# Patient Record
Sex: Female | Born: 1935
Health system: Southern US, Community
[De-identification: ages and names within clinical notes are randomized; demographics above are authoritative.]

## PROBLEM LIST (undated history)

## (undated) DIAGNOSIS — F1190 Opioid use, unspecified, uncomplicated: Secondary | ICD-10-CM

## (undated) DIAGNOSIS — Z8742 Personal history of other diseases of the female genital tract: Secondary | ICD-10-CM

## (undated) DIAGNOSIS — R011 Cardiac murmur, unspecified: Secondary | ICD-10-CM

## (undated) DIAGNOSIS — M545 Low back pain, unspecified: Secondary | ICD-10-CM

## (undated) DIAGNOSIS — F32A Depression, unspecified: Secondary | ICD-10-CM

## (undated) DIAGNOSIS — M419 Scoliosis, unspecified: Secondary | ICD-10-CM

## (undated) DIAGNOSIS — F329 Major depressive disorder, single episode, unspecified: Secondary | ICD-10-CM

## (undated) DIAGNOSIS — G8929 Other chronic pain: Secondary | ICD-10-CM

## (undated) DIAGNOSIS — N200 Calculus of kidney: Secondary | ICD-10-CM

## (undated) DIAGNOSIS — E039 Hypothyroidism, unspecified: Secondary | ICD-10-CM

## (undated) DIAGNOSIS — K219 Gastro-esophageal reflux disease without esophagitis: Secondary | ICD-10-CM

## (undated) DIAGNOSIS — M8580 Other specified disorders of bone density and structure, unspecified site: Secondary | ICD-10-CM

## (undated) DIAGNOSIS — Z87442 Personal history of urinary calculi: Secondary | ICD-10-CM

## (undated) DIAGNOSIS — L409 Psoriasis, unspecified: Secondary | ICD-10-CM

## (undated) DIAGNOSIS — R351 Nocturia: Secondary | ICD-10-CM

## (undated) DIAGNOSIS — K449 Diaphragmatic hernia without obstruction or gangrene: Secondary | ICD-10-CM

## (undated) DIAGNOSIS — N182 Chronic kidney disease, stage 2 (mild): Secondary | ICD-10-CM

## (undated) DIAGNOSIS — I10 Essential (primary) hypertension: Secondary | ICD-10-CM

## (undated) DIAGNOSIS — E785 Hyperlipidemia, unspecified: Secondary | ICD-10-CM

## (undated) DIAGNOSIS — M1990 Unspecified osteoarthritis, unspecified site: Secondary | ICD-10-CM

## (undated) DIAGNOSIS — Z973 Presence of spectacles and contact lenses: Secondary | ICD-10-CM

## (undated) HISTORY — DX: Other specified disorders of bone density and structure, unspecified site: M85.80

## (undated) HISTORY — PX: CARDIOVASCULAR STRESS TEST: SHX262

## (undated) HISTORY — PX: ABDOMINAL HYSTERECTOMY: SHX81

## (undated) HISTORY — PX: CARDIAC CATHETERIZATION: SHX172

## (undated) HISTORY — DX: Unspecified osteoarthritis, unspecified site: M19.90

## (undated) HISTORY — DX: Gastro-esophageal reflux disease without esophagitis: K21.9

## (undated) HISTORY — DX: Scoliosis, unspecified: M41.9

## (undated) HISTORY — PX: OTHER SURGICAL HISTORY: SHX169

## (undated) HISTORY — DX: Major depressive disorder, single episode, unspecified: F32.9

## (undated) HISTORY — DX: Depression, unspecified: F32.A

## (undated) HISTORY — DX: Hyperlipidemia, unspecified: E78.5

---

## 1989-01-03 HISTORY — PX: BREAST LUMPECTOMY: SHX2

## 1999-05-06 HISTORY — PX: CATARACT EXTRACTION W/ INTRAOCULAR LENS  IMPLANT, BILATERAL: SHX1307

## 1999-08-01 ENCOUNTER — Other Ambulatory Visit: Admission: RE | Admit: 1999-08-01 | Discharge: 1999-08-01 | Payer: Self-pay | Admitting: *Deleted

## 2000-04-18 ENCOUNTER — Ambulatory Visit (HOSPITAL_COMMUNITY): Admission: RE | Admit: 2000-04-18 | Discharge: 2000-04-18 | Payer: Self-pay | Admitting: *Deleted

## 2000-04-18 ENCOUNTER — Encounter: Payer: Self-pay | Admitting: *Deleted

## 2001-03-02 ENCOUNTER — Other Ambulatory Visit: Admission: RE | Admit: 2001-03-02 | Discharge: 2001-03-02 | Payer: Self-pay | Admitting: *Deleted

## 2002-01-26 ENCOUNTER — Ambulatory Visit: Admission: RE | Admit: 2002-01-26 | Discharge: 2002-01-26 | Payer: Self-pay | Admitting: Family Medicine

## 2002-01-26 ENCOUNTER — Encounter: Payer: Self-pay | Admitting: Family Medicine

## 2004-03-13 ENCOUNTER — Encounter: Admission: RE | Admit: 2004-03-13 | Discharge: 2004-04-23 | Payer: Self-pay | Admitting: Family Medicine

## 2004-06-26 ENCOUNTER — Encounter: Admission: RE | Admit: 2004-06-26 | Discharge: 2004-08-08 | Payer: Self-pay | Admitting: Family Medicine

## 2004-09-24 ENCOUNTER — Encounter: Admission: RE | Admit: 2004-09-24 | Discharge: 2004-10-21 | Payer: Self-pay | Admitting: Family Medicine

## 2004-11-15 ENCOUNTER — Other Ambulatory Visit: Admission: RE | Admit: 2004-11-15 | Discharge: 2004-11-15 | Payer: Self-pay | Admitting: Family Medicine

## 2006-08-18 ENCOUNTER — Encounter
Admission: RE | Admit: 2006-08-18 | Discharge: 2006-08-18 | Payer: Self-pay | Admitting: Physical Medicine and Rehabilitation

## 2006-08-18 ENCOUNTER — Encounter: Admission: RE | Admit: 2006-08-18 | Discharge: 2006-08-18 | Payer: Self-pay | Admitting: Family Medicine

## 2006-12-29 ENCOUNTER — Ambulatory Visit (HOSPITAL_COMMUNITY): Admission: RE | Admit: 2006-12-29 | Discharge: 2006-12-29 | Payer: Self-pay | Admitting: Family Medicine

## 2006-12-29 ENCOUNTER — Ambulatory Visit: Payer: Self-pay | Admitting: Surgery

## 2006-12-29 ENCOUNTER — Encounter (INDEPENDENT_AMBULATORY_CARE_PROVIDER_SITE_OTHER): Payer: Self-pay | Admitting: Family Medicine

## 2007-01-06 ENCOUNTER — Ambulatory Visit: Payer: Self-pay | Admitting: Vascular Surgery

## 2007-09-23 ENCOUNTER — Encounter: Admission: RE | Admit: 2007-09-23 | Discharge: 2007-09-23 | Payer: Self-pay | Admitting: Specialist

## 2009-06-17 ENCOUNTER — Emergency Department (HOSPITAL_COMMUNITY): Admission: EM | Admit: 2009-06-17 | Discharge: 2009-06-17 | Payer: Self-pay | Admitting: Emergency Medicine

## 2009-08-28 ENCOUNTER — Emergency Department (HOSPITAL_COMMUNITY): Admission: EM | Admit: 2009-08-28 | Discharge: 2009-08-28 | Payer: Self-pay | Admitting: Emergency Medicine

## 2009-09-19 ENCOUNTER — Inpatient Hospital Stay (HOSPITAL_COMMUNITY): Admission: RE | Admit: 2009-09-19 | Discharge: 2009-09-21 | Payer: Self-pay | Admitting: Urology

## 2009-09-19 HISTORY — PX: PERCUTANEOUS NEPHROSTOLITHOTOMY: SHX2207

## 2009-12-25 ENCOUNTER — Inpatient Hospital Stay (HOSPITAL_COMMUNITY): Admission: EM | Admit: 2009-12-25 | Discharge: 2009-12-26 | Payer: Self-pay | Admitting: Emergency Medicine

## 2009-12-28 HISTORY — PX: TRANSTHORACIC ECHOCARDIOGRAM: SHX275

## 2010-01-04 ENCOUNTER — Inpatient Hospital Stay (HOSPITAL_COMMUNITY): Admission: EM | Admit: 2010-01-04 | Discharge: 2010-01-04 | Payer: Self-pay | Admitting: Emergency Medicine

## 2010-01-23 ENCOUNTER — Inpatient Hospital Stay (HOSPITAL_BASED_OUTPATIENT_CLINIC_OR_DEPARTMENT_OTHER): Admission: RE | Admit: 2010-01-23 | Discharge: 2010-01-23 | Payer: Self-pay | Admitting: Interventional Cardiology

## 2010-07-18 LAB — VITAMIN B12: Vitamin B-12: 803 pg/mL (ref 211–911)

## 2010-07-18 LAB — CBC
Hemoglobin: 12.9 g/dL (ref 12.0–15.0)
MCH: 32.1 pg (ref 26.0–34.0)
MCHC: 33.3 g/dL (ref 30.0–36.0)
RBC: 4.01 MIL/uL (ref 3.87–5.11)
RDW: 13.6 % (ref 11.5–15.5)

## 2010-07-18 LAB — CK TOTAL AND CKMB (NOT AT ARMC): Relative Index: INVALID (ref 0.0–2.5)

## 2010-07-18 LAB — C-REACTIVE PROTEIN: CRP: 0 mg/dL — ABNORMAL LOW (ref <0.6)

## 2010-07-18 LAB — COMPREHENSIVE METABOLIC PANEL
AST: 27 U/L (ref 0–37)
Albumin: 4.1 g/dL (ref 3.5–5.2)
Alkaline Phosphatase: 58 U/L (ref 39–117)
CO2: 22 mEq/L (ref 19–32)
Calcium: 9.4 mg/dL (ref 8.4–10.5)
Creatinine, Ser: 1.1 mg/dL (ref 0.4–1.2)
GFR calc Af Amer: 59 mL/min — ABNORMAL LOW (ref >60)
GFR calc non Af Amer: 48 mL/min — ABNORMAL LOW (ref >60)

## 2010-07-18 LAB — DIFFERENTIAL
Lymphs Abs: 3.3 10*3/uL (ref 0.7–4.0)
Neutro Abs: 3.1 10*3/uL (ref 1.7–7.7)

## 2010-07-18 LAB — PROTIME-INR: INR: 1.03 (ref 0.00–1.49)

## 2010-07-18 LAB — TROPONIN I: Troponin I: 0.02 ng/mL (ref 0.00–0.06)

## 2010-07-18 LAB — SEDIMENTATION RATE: Sed Rate: 13 mm/hr (ref 0–22)

## 2010-07-18 LAB — APTT: aPTT: 28 seconds (ref 24–37)

## 2010-07-19 LAB — BASIC METABOLIC PANEL
Chloride: 106 mEq/L (ref 96–112)
GFR calc Af Amer: >60 mL/min (ref >60)
Sodium: 140 mEq/L (ref 135–145)

## 2010-07-19 LAB — DIFFERENTIAL
Basophils Absolute: 0 10*3/uL (ref 0.0–0.1)
Basophils Relative: 1 % (ref 0–1)
Eosinophils Absolute: 0.1 10*3/uL (ref 0.0–0.7)
Monocytes Relative: 9 % (ref 3–12)
Neutro Abs: 2.4 10*3/uL (ref 1.7–7.7)
Neutrophils Relative %: 47 % (ref 43–77)

## 2010-07-19 LAB — COMPREHENSIVE METABOLIC PANEL
AST: 18 U/L (ref 0–37)
Albumin: 3.2 g/dL — ABNORMAL LOW (ref 3.5–5.2)
Alkaline Phosphatase: 44 U/L (ref 39–117)
BUN: 13 mg/dL (ref 6–23)
Chloride: 111 mEq/L (ref 96–112)
GFR calc Af Amer: >60 mL/min (ref >60)
Potassium: 4.4 mEq/L (ref 3.5–5.1)
Sodium: 142 mEq/L (ref 135–145)
Total Protein: 5.6 g/dL — ABNORMAL LOW (ref 6.0–8.3)

## 2010-07-19 LAB — POCT CARDIAC MARKERS
Myoglobin, poc: 53 ng/mL (ref 12–200)
Troponin i, poc: <0.05 ng/mL (ref 0.00–0.09)

## 2010-07-19 LAB — CBC
Hemoglobin: 12.8 g/dL (ref 12.0–15.0)
MCH: 32.8 pg (ref 26.0–34.0)
MCHC: 34 g/dL (ref 30.0–36.0)
Platelets: 273 10*3/uL (ref 150–400)

## 2010-07-19 LAB — PROTIME-INR: Prothrombin Time: 14 seconds (ref 11.6–15.2)

## 2010-07-19 LAB — CARDIAC PANEL(CRET KIN+CKTOT+MB+TROPI)
CK, MB: 1.5 ng/mL (ref 0.3–4.0)
CK, MB: 1.6 ng/mL (ref 0.3–4.0)
Relative Index: 1.4 (ref 0.0–2.5)
Relative Index: 1.5 (ref 0.0–2.5)
Total CK: 115 U/L (ref 7–177)
Total CK: 95 U/L (ref 7–177)
Troponin I: 0.01 ng/mL (ref 0.00–0.06)
Troponin I: 0.01 ng/mL (ref 0.00–0.06)

## 2010-07-19 LAB — LIPID PANEL
LDL Cholesterol: 97 mg/dL (ref 0–99)
Total CHOL/HDL Ratio: 3 RATIO
VLDL: 14 mg/dL (ref 0–40)

## 2010-07-19 LAB — D-DIMER, QUANTITATIVE: D-Dimer, Quant: 0.32 ug/mL-FEU (ref 0.00–0.48)

## 2010-07-19 LAB — TSH: TSH: 1.065 u[IU]/mL (ref 0.350–4.500)

## 2010-07-22 LAB — CBC
HCT: 39.4 % (ref 36.0–46.0)
Hemoglobin: 13.2 g/dL (ref 12.0–15.0)
MCHC: 33.1 g/dL (ref 30.0–36.0)
MCHC: 33.5 g/dL (ref 30.0–36.0)
Platelets: 230 10*3/uL (ref 150–400)
Platelets: 277 10*3/uL (ref 150–400)
RDW: 13.7 % (ref 11.5–15.5)
RDW: 14 % (ref 11.5–15.5)

## 2010-07-22 LAB — COMPREHENSIVE METABOLIC PANEL
Alkaline Phosphatase: 63 U/L (ref 39–117)
BUN: 14 mg/dL (ref 6–23)
Chloride: 106 mEq/L (ref 96–112)
Creatinine, Ser: 0.88 mg/dL (ref 0.4–1.2)
GFR calc non Af Amer: >60 mL/min (ref >60)
Glucose, Bld: 91 mg/dL (ref 70–99)
Potassium: 4.4 mEq/L (ref 3.5–5.1)
Total Bilirubin: 1 mg/dL (ref 0.3–1.2)

## 2010-07-22 LAB — PROTIME-INR: Prothrombin Time: 12.7 seconds (ref 11.6–15.2)

## 2010-07-22 LAB — ABO/RH: ABO/RH(D): O POS

## 2010-07-23 LAB — LIPASE, BLOOD: Lipase: 41 U/L (ref 11–59)

## 2010-07-23 LAB — DIFFERENTIAL
Eosinophils Absolute: 0.1 10*3/uL (ref 0.0–0.7)
Eosinophils Relative: 2 % (ref 0–5)
Lymphs Abs: 2.5 10*3/uL (ref 0.7–4.0)

## 2010-07-23 LAB — URINE CULTURE: Colony Count: 70000

## 2010-07-23 LAB — URINALYSIS, ROUTINE W REFLEX MICROSCOPIC
Bilirubin Urine: NEGATIVE
Glucose, UA: NEGATIVE mg/dL
Ketones, ur: NEGATIVE mg/dL
Nitrite: NEGATIVE
pH: 8 (ref 5.0–8.0)

## 2010-07-23 LAB — COMPREHENSIVE METABOLIC PANEL
ALT: 22 U/L (ref 0–35)
AST: 25 U/L (ref 0–37)
CO2: 28 mEq/L (ref 19–32)
Calcium: 9.2 mg/dL (ref 8.4–10.5)
Chloride: 103 mEq/L (ref 96–112)
GFR calc Af Amer: >60 mL/min (ref >60)
GFR calc non Af Amer: 56 mL/min — ABNORMAL LOW (ref >60)
Potassium: 4 mEq/L (ref 3.5–5.1)
Sodium: 138 mEq/L (ref 135–145)

## 2010-07-23 LAB — CBC
MCHC: 33.1 g/dL (ref 30.0–36.0)
RBC: 4.1 MIL/uL (ref 3.87–5.11)
WBC: 7.5 10*3/uL (ref 4.0–10.5)

## 2010-07-23 LAB — URINE MICROSCOPIC-ADD ON

## 2010-07-25 LAB — CBC
HCT: 42.5 % (ref 36.0–46.0)
Hemoglobin: 14.4 g/dL (ref 12.0–15.0)
MCHC: 33.8 g/dL (ref 30.0–36.0)
MCV: 97.3 fL (ref 78.0–100.0)
Platelets: 246 10*3/uL (ref 150–400)
RBC: 4.36 MIL/uL (ref 3.87–5.11)
RDW: 12.9 % (ref 11.5–15.5)
WBC: 4.7 10*3/uL (ref 4.0–10.5)

## 2010-07-25 LAB — DIFFERENTIAL
Basophils Absolute: 0 10*3/uL (ref 0.0–0.1)
Basophils Relative: 0 % (ref 0–1)
Eosinophils Absolute: 0.2 K/uL (ref 0.0–0.7)
Eosinophils Relative: 3 % (ref 0–5)
Lymphocytes Relative: 29 % (ref 12–46)
Lymphs Abs: 1.4 10*3/uL (ref 0.7–4.0)
Monocytes Absolute: 0.5 10*3/uL (ref 0.1–1.0)
Monocytes Relative: 10 % (ref 3–12)
Neutro Abs: 2.7 10*3/uL (ref 1.7–7.7)
Neutrophils Relative %: 57 % (ref 43–77)

## 2010-07-25 LAB — COMPREHENSIVE METABOLIC PANEL
Albumin: 3.7 g/dL (ref 3.5–5.2)
BUN: 21 mg/dL (ref 6–23)
Calcium: 9 mg/dL (ref 8.4–10.5)
Chloride: 108 mEq/L (ref 96–112)
Creatinine, Ser: 0.85 mg/dL (ref 0.4–1.2)
GFR calc Af Amer: >60 mL/min (ref >60)
GFR calc non Af Amer: >60 mL/min (ref >60)
Total Bilirubin: 0.5 mg/dL (ref 0.3–1.2)

## 2010-07-25 LAB — COMPREHENSIVE METABOLIC PANEL WITH GFR
ALT: 95 U/L — ABNORMAL HIGH (ref 0–35)
AST: 70 U/L — ABNORMAL HIGH (ref 0–37)
Alkaline Phosphatase: 53 U/L (ref 39–117)
CO2: 20 meq/L (ref 19–32)
Glucose, Bld: 88 mg/dL (ref 70–99)
Potassium: 3.6 meq/L (ref 3.5–5.1)
Sodium: 138 meq/L (ref 135–145)
Total Protein: 7.2 g/dL (ref 6.0–8.3)

## 2010-07-25 LAB — LIPASE, BLOOD: Lipase: 24 U/L (ref 11–59)

## 2010-09-17 NOTE — Consult Note (Signed)
NEW PATIENT CONSULTATION   Becky Duran, Becky Duran  DOB:  03-20-1933                                       01/06/2007  EAVWU#:[id number]   This patient presents today for evaluation of lower-extremity venous  pathology.  She is a pleasant 77 year old white female with a long  history of left leg venous varicosities and right leg spider vein  telangiectasia causing her discomfort.  She reports over the past  several months this has become much more severe with discomfort in both  medial calves.  She reports that she has aching and burning in both  lower extremities constantly, but most particularly over the  varicosities.  She does not have any history of deep venous thrombosis  or hemorrhage.  She reports this is more severe with prolonged sitting,  standing, going up and down stairs and also house work that requires her  to squat to do her work.   PAST MEDICAL HISTORY:  Significant for hypothyroidism, elevated  cholesterol.  She is not hypertensive or diabetic.  She does have a  history of heart murmur.   PAST SURGICAL HISTORY:  Significant for hysterectomy and cyst removal  for benign breast disease.  She does elevate her legs when possible and  does use Tylenol for discomfort.   She is married.  She has no children.  She is retired.  She does not  smoke or drink alcohol on a regular basis.   REVIEW OF SYSTEMS:  Positive only for arthritic joint pain.   SHE IS ALLERGIC TO PENICILLIN.   PHYSICAL EXAMINATION:  A well-developed, well-nourished white female  appearing stated age of 77.  Her blood pressure 132/80, pulse 76,  respirations 18.  Her dorsalis pedis pulses are 2+ bilaterally.  She  does have spider vein telangiectasia diffusely over both legs.  She does  have a patch of telangiectasia over the right medial calf that is very  pronounced, and she reports does cause her pain with prolonged standing.  She does have tributary varicosities over the left medial  thigh and  medial calf.  She underwent hand-held duplex by me, and this did reveal  reflux in her saphenous vein on the left, with branch from her saphenous  vein leading to the superficial varicosities on her thigh and calf.  Her  right vein appears to be without reflux by screening examination.  I  discussed options with the patient.  I explained that this is not limb-  threatening condition.  I explained it would slowly progress over time,  and that we would recommend treatment only if she was having significant  pain.  She reports that this is intolerable to her and that she is  unable to function in her daily activities and wishes to have treatment.  We have fitted her today with thigh-high graduated compression  stockings.  I explained the importance of elevation when possible,  Motrin when possible for discomfort and daily use of her compression  garments.  We will see her again in 3 months with a formal duplex  evaluation to determine if compression garments alone are treating her  satisfactorily.  She will notify us should she have any new difficulty  in the interim.   Larina Earthly, M.D.  Electronically Signed   TFE/MEDQ  D:  01/06/2007  T:  01/07/2007  Job:  [id number]  cc:   Holley Bouche, M.D.

## 2011-01-14 ENCOUNTER — Encounter (INDEPENDENT_AMBULATORY_CARE_PROVIDER_SITE_OTHER): Payer: Self-pay | Admitting: General Surgery

## 2011-01-16 ENCOUNTER — Ambulatory Visit (INDEPENDENT_AMBULATORY_CARE_PROVIDER_SITE_OTHER): Payer: Medicare Other | Admitting: General Surgery

## 2011-01-16 ENCOUNTER — Encounter (INDEPENDENT_AMBULATORY_CARE_PROVIDER_SITE_OTHER): Payer: Self-pay | Admitting: General Surgery

## 2011-01-16 VITALS — BP 123/81 | HR 71 | Temp 98.6°F | Ht 65.0 in | Wt 177.0 lb

## 2011-01-16 DIAGNOSIS — D1779 Benign lipomatous neoplasm of other sites: Secondary | ICD-10-CM

## 2011-01-16 DIAGNOSIS — D171 Benign lipomatous neoplasm of skin and subcutaneous tissue of trunk: Secondary | ICD-10-CM

## 2011-01-16 NOTE — Progress Notes (Signed)
Chief Complaint  Patient presents with  . Lipoma    upper lt shoulder    HPI Becky Duran is a 77 y.o. female.  This patient was referred by Dr. Tiburcio Pea for evaluation of a left upper back mass. She states that the mass has been present for over 10 years and has only intermittently cause symptoms until recently. She states that over the last 6 months or so this has become increasingly tender and uncomfortable. She states that she has not had any drainage or redness in the area but that it has fluctuated in size from time to time. She denies any other masses or family history of malignancy. HPI  Past Medical History  Diagnosis Date  . Depression   . GERD (gastroesophageal reflux disease)   . Thyroid disease     hypothyroidism  . Hyperlipidemia   . Endometriosis   . Osteoarthritis   . Eczema   . Osteopenia   . Scoliosis   . Kidney stones     Past Surgical History  Procedure Date  . Abdominal hysterectomy     History reviewed. No pertinent family history.  Social History History  Substance Use Topics  . Smoking status: Never Smoker   . Smokeless tobacco: Not on file  . Alcohol Use: No    Allergies  Allergen Reactions  . Ciprofloxacin     Metallic taste  . Etodolac   . Lisinopril   . Losartan Potassium     Fatigue   . Penicillins Hives  . Relafen (Nabumetone)   . Sulfa Drugs Cross Reactors     diarrhea  . Valtrex     Possible confusion  . Voltaren     Current Outpatient Prescriptions  Medication Sig Dispense Refill  . acetaminophen (TYLENOL) 650 MG CR tablet Take 650 mg by mouth every 8 (eight) hours as needed.        Marland Kitchen amLODipine (NORVASC) 5 MG tablet Take 5 mg by mouth daily.        Marland Kitchen aspirin 81 MG tablet Take 81 mg by mouth daily.        Marland Kitchen atorvastatin (LIPITOR) 20 MG tablet Take 20 mg by mouth daily.        Marland Kitchen FLUoxetine (PROZAC) 10 MG tablet Take 10 mg by mouth daily.        Marland Kitchen levothyroxine (SYNTHROID, LEVOTHROID) 75 MCG tablet Take 75 mcg by mouth  daily.        . nebivolol (BYSTOLIC) 10 MG tablet Take 10 mg by mouth daily.          Review of Systems Review of Systems  All other systems reviewed and are negative.  Review of systems positive for constipation arthritis and easy bruising  Blood pressure 123/81, pulse 71, temperature 98.6 F (37 C), temperature source Temporal, height 5\' 5"  (1.651 m), weight 177 lb (80.287 kg).  Physical Exam Physical Exam  Constitutional: She is oriented to person, place, and time. She appears well-developed and well-nourished.  HENT:  Head: Normocephalic and atraumatic.  Eyes: Conjunctivae and EOM are normal. Pupils are equal, round, and reactive to light. Right eye exhibits no discharge. Left eye exhibits no discharge. No scleral icterus.  Neck: Normal range of motion. Neck supple. No tracheal deviation present.       She has a superficial, 4 cm x 4 cm palpable mass just to the left of her upper back at the base of her neck, this appears on exam to be consistent with a lipoma,  no evidence of infection  Cardiovascular: Normal rate.   Pulmonary/Chest: Effort normal and breath sounds normal. No stridor. No respiratory distress. She has no wheezes.  Abdominal: Soft. Bowel sounds are normal. She exhibits no distension and no mass. There is no tenderness. There is no rebound and no guarding.  Musculoskeletal: Normal range of motion. She exhibits no edema and no tenderness.  Neurological: She is alert and oriented to person, place, and time.  Skin: Skin is warm and dry. No rash noted. No erythema. No pallor.  Psychiatric: She has a normal mood and affect. Her behavior is normal. Judgment and thought content normal.    Data Reviewed   Assessment    Left upper back mass consistent with likely lipoma    Plan    I think this is most likely a lipoma and I discussed with her the options for continued observation and versus surgical excision. She states that since this is symptomatic she would like to  have this removed. I discussed with her the risks of the procedure including infection, bleeding, pain, scarring, nerve injury, recurrence and she expressed understanding and desires to proceed with surgical excision.       Lodema Pilot DAVID 01/16/2011, 8:39 AM

## 2011-01-29 ENCOUNTER — Encounter (HOSPITAL_COMMUNITY)
Admission: RE | Admit: 2011-01-29 | Discharge: 2011-01-29 | Disposition: A | Discharge status: Home / Self Care | Payer: Medicare Other | Source: Ambulatory Visit | Attending: General Surgery | Admitting: General Surgery

## 2011-01-29 ENCOUNTER — Ambulatory Visit (HOSPITAL_COMMUNITY)
Admission: RE | Admit: 2011-01-29 | Discharge: 2011-01-29 | Disposition: A | Discharge status: Home / Self Care | Payer: Medicare Other | Source: Ambulatory Visit | Attending: General Surgery | Admitting: General Surgery

## 2011-01-29 ENCOUNTER — Other Ambulatory Visit (INDEPENDENT_AMBULATORY_CARE_PROVIDER_SITE_OTHER): Payer: Self-pay | Admitting: General Surgery

## 2011-01-29 DIAGNOSIS — Z01818 Encounter for other preprocedural examination: Secondary | ICD-10-CM | POA: Insufficient documentation

## 2011-01-29 DIAGNOSIS — Z01811 Encounter for preprocedural respiratory examination: Secondary | ICD-10-CM

## 2011-01-29 DIAGNOSIS — D179 Benign lipomatous neoplasm, unspecified: Secondary | ICD-10-CM

## 2011-01-29 LAB — CBC
HCT: 39.3 % (ref 36.0–46.0)
Hemoglobin: 12.8 g/dL (ref 12.0–15.0)
RBC: 4.07 MIL/uL (ref 3.87–5.11)

## 2011-01-29 LAB — COMPREHENSIVE METABOLIC PANEL
ALT: 21 U/L (ref 0–35)
Alkaline Phosphatase: 57 U/L (ref 39–117)
BUN: 17 mg/dL (ref 6–23)
CO2: 29 mEq/L (ref 19–32)
Chloride: 102 mEq/L (ref 96–112)
GFR calc Af Amer: >60 mL/min (ref >60)
GFR calc non Af Amer: >60 mL/min (ref >60)
Glucose, Bld: 83 mg/dL (ref 70–99)
Potassium: 4.2 mEq/L (ref 3.5–5.1)
Sodium: 140 mEq/L (ref 135–145)
Total Bilirubin: 0.9 mg/dL (ref 0.3–1.2)

## 2011-02-05 ENCOUNTER — Ambulatory Visit (HOSPITAL_COMMUNITY)
Admission: RE | Admit: 2011-02-05 | Discharge: 2011-02-05 | Disposition: A | Discharge status: Home / Self Care | Payer: Medicare Other | Source: Ambulatory Visit | Attending: General Surgery | Admitting: General Surgery

## 2011-02-05 ENCOUNTER — Other Ambulatory Visit (INDEPENDENT_AMBULATORY_CARE_PROVIDER_SITE_OTHER): Payer: Self-pay | Admitting: General Surgery

## 2011-02-05 DIAGNOSIS — D1739 Benign lipomatous neoplasm of skin and subcutaneous tissue of other sites: Secondary | ICD-10-CM | POA: Insufficient documentation

## 2011-02-05 DIAGNOSIS — I10 Essential (primary) hypertension: Secondary | ICD-10-CM | POA: Insufficient documentation

## 2011-02-05 DIAGNOSIS — Z01818 Encounter for other preprocedural examination: Secondary | ICD-10-CM | POA: Insufficient documentation

## 2011-02-05 DIAGNOSIS — K219 Gastro-esophageal reflux disease without esophagitis: Secondary | ICD-10-CM | POA: Insufficient documentation

## 2011-02-05 DIAGNOSIS — Z01812 Encounter for preprocedural laboratory examination: Secondary | ICD-10-CM | POA: Insufficient documentation

## 2011-02-05 HISTORY — PX: LIPOMA EXCISION: SHX5283

## 2011-02-14 NOTE — Op Note (Signed)
[name], Becky Duran                ACCOUNT NO.:  [id number]  MEDICAL RECORD NO.:  [id number]  LOCATION:  [facility]                         FACILITY:  [facility]  PHYSICIAN:  Lodema Pilot, MD       DATE OF BIRTH:  13-Dec-1932  DATE OF PROCEDURE:  02/05/2011 DATE OF DISCHARGE:                              OPERATIVE REPORT   PROCEDURE:  Excision of left upper back mass.  PREOPERATIVE DIAGNOSIS:  Back lipoma.  POSTOPERATIVE DIAGNOSIS:  Back lipoma.  SURGEON:  Lodema Pilot, MD  ASSISTANT:  None.  ANESTHESIA:  General endotracheal tube anesthesia with 20 mL of 1% lidocaine with epinephrine and 0.25% Marcaine in a 50/50 mixture.  FLUIDS:  800 mL of crystalloid.  ESTIMATED BLOOD LOSS:  Minimal.  DRAINS:  None.  SPECIMENS:  Left upper back mass measuring 4 cm x 3 cm sent to Pathology for permanent sectioning.  COMPLICATIONS:  None apparent.  INDICATIONS:  [name] is a 77 year old female with left upper back mass, which she states is symptomatic and which she would like to have removed.  OPERATIVE DETAILS:  [name] was seen and evaluated in preoperative area prior to anesthetic administration and the lesion was marked with a permanent marker with the patient confirmation.  She was given prophylactic antibiotics and taken to the operating room, placed on the table in supine position, and general endotracheal tube anesthesia was obtained then she was flipped in the lateral position and the area around her upper back in the lesion was prepped and draped in a standard surgical fashion and a transverse incision was made over the lesion and dissection carried down through the skin and subcutaneous tissue using Bovie electrocautery until the lipomatous-appearing fat was identified. Then the lipoma was dissected circumferentially using blunt dissection and occasional Bovie electrocautery.  The lesion was delivered up into the wound and the final attachments were divided with  Bovie electrocautery.  Care was taken to avoid injury to the accessory nerve, however, this lipoma was superficial and did not approach the muscular layer and again just appeared to be subcutaneous fat.  The lesion was measured 4 cm x 3 cm and was passed off the table and sent to Pathology for permanent sectioning.  The cavity was palpated for any other lipomatous tissue and there was no other abnormal-appearing fat in the area.  The wound was inspected for hemostasis, which was obtained with Bovie electrocautery.  Then was irrigated with sterile saline solution and noted to be hemostatic.  The wound was injected with 20 mL of 1% lidocaine with epinephrine 0.25% Marcaine in 50/50 mixture.  Again the wound was noted to be hemostatic.  The dermis was then approximated with interrupted 2-0 Vicryl sutures and skin edges were approximated with 2-0 nylon of vertical mattress sutures.  The skin was washed and dried and sterile dressing was applied.  All sponge, needle, and instrument counts were correct at the end of the case and the patient tolerated the procedure well without apparent complications.          ______________________________ Lodema Pilot, MD     BL/MEDQ  D:  02/05/2011  T:  02/05/2011  Job:  [id number]  Electronically Signed by Lodema Pilot DO on 02/14/2011 12:13:46 AM

## 2011-02-19 ENCOUNTER — Encounter (INDEPENDENT_AMBULATORY_CARE_PROVIDER_SITE_OTHER): Payer: Self-pay | Admitting: General Surgery

## 2011-02-19 ENCOUNTER — Ambulatory Visit (INDEPENDENT_AMBULATORY_CARE_PROVIDER_SITE_OTHER): Payer: Medicare Other | Admitting: General Surgery

## 2011-02-19 VITALS — BP 120/82 | HR 68 | Temp 97.8°F | Resp 16 | Ht 65.0 in | Wt 178.8 lb

## 2011-02-19 DIAGNOSIS — Z4889 Encounter for other specified surgical aftercare: Secondary | ICD-10-CM

## 2011-02-19 DIAGNOSIS — Z5189 Encounter for other specified aftercare: Secondary | ICD-10-CM

## 2011-02-19 NOTE — Progress Notes (Signed)
Subjective:     Patient ID: Becky Duran, female   DOB: 05-14-1932, 77 y.o.   MRN: [id number]  HPI Patient follows up 2 weeks status post excisional lipoma on the upper back and lower neck. She has no complaints and denies any pain. Denies any fevers or chills. Her pathology was benign.  Review of Systems     Objective:   Physical Exam And her incision is healing well without sign of infection there is no evidence of recurrent lipoma her sutures removed and benzoin and Steri-Strips were applied.    Assessment:     Status post excision of lipoma-doing well    Plan:     She is doing very well and can follow up on a p.r.n. basis.

## 2011-03-03 ENCOUNTER — Other Ambulatory Visit: Payer: Self-pay | Admitting: Neurosurgery

## 2011-03-03 DIAGNOSIS — M549 Dorsalgia, unspecified: Secondary | ICD-10-CM

## 2011-03-05 ENCOUNTER — Ambulatory Visit
Admission: RE | Admit: 2011-03-05 | Discharge: 2011-03-05 | Disposition: A | Discharge status: Home / Self Care | Payer: Medicare Other | Source: Ambulatory Visit | Attending: Neurosurgery | Admitting: Neurosurgery

## 2011-03-05 VITALS — BP 116/69 | HR 72

## 2011-03-05 DIAGNOSIS — M549 Dorsalgia, unspecified: Secondary | ICD-10-CM

## 2011-03-05 MED ORDER — DIAZEPAM 5 MG PO TABS
5.0000 mg | ORAL_TABLET | Freq: Once | ORAL | Status: AC
  Administered 2011-03-05: 5 mg via ORAL

## 2011-03-05 MED ORDER — ONDANSETRON HCL 4 MG/2ML IJ SOLN
4.0000 mg | Freq: Once | INTRAMUSCULAR | Status: AC
  Administered 2011-03-05: 4 mg via INTRAMUSCULAR

## 2011-03-05 MED ORDER — IOHEXOL 180 MG/ML  SOLN
15.0000 mL | Freq: Once | INTRAMUSCULAR | Status: AC | PRN
  Administered 2011-03-05: 15 mL via INTRATHECAL

## 2011-03-05 MED ORDER — MEPERIDINE HCL 100 MG/ML IJ SOLN
75.0000 mg | Freq: Once | INTRAMUSCULAR | Status: AC
  Administered 2011-03-05: 75 mg via INTRAMUSCULAR

## 2011-03-05 NOTE — Progress Notes (Addendum)
Went thru pt's allergic and found most were not true allergies and corrected them. Discharge instructions explained and consent signed

## 2011-03-05 NOTE — Patient Instructions (Signed)

## 2011-05-22 DIAGNOSIS — M545 Low back pain: Secondary | ICD-10-CM | POA: Diagnosis not present

## 2011-05-22 DIAGNOSIS — M48061 Spinal stenosis, lumbar region without neurogenic claudication: Secondary | ICD-10-CM | POA: Diagnosis not present

## 2011-05-22 DIAGNOSIS — IMO0002 Reserved for concepts with insufficient information to code with codable children: Secondary | ICD-10-CM | POA: Diagnosis not present

## 2011-05-22 DIAGNOSIS — M5137 Other intervertebral disc degeneration, lumbosacral region: Secondary | ICD-10-CM | POA: Diagnosis not present

## 2011-05-23 ENCOUNTER — Other Ambulatory Visit: Payer: Self-pay | Admitting: Neurosurgery

## 2011-05-26 ENCOUNTER — Encounter (HOSPITAL_COMMUNITY): Payer: Self-pay | Admitting: Pharmacy Technician

## 2011-05-29 ENCOUNTER — Other Ambulatory Visit (HOSPITAL_COMMUNITY): Payer: Self-pay | Admitting: *Deleted

## 2011-05-29 NOTE — Pre-Procedure Instructions (Signed)
20 OLA RAAP  05/29/2011   Your procedure is scheduled on:  January 30th.    Report to [facility] at 10:15 AM.   Call this number if you have problems the morning of surgery: [phone]   Remember:   Do not eat food:After Midnight.   May have clear liquids: up to 4 Hours before arrival.  Clear liquids include soda, tea, black coffee, apple or grape juice, broth.  Take these medicines the morning of surgery with A SIP OF WATER: Acetaminophen or Hydrocodone- Acetaminophen, Amlodipine (Norvasc), Fluoxetine (Prozac). Discontinue Aspirin, Coumadin, Plavix, Effient and Herbal Medications   Do not wear jewelry, make-up or nail polish.  Do not wear lotions, powders, or perfumes. You may wear deodorant.  Do not shave 48 hours prior to surgery.  Do not bring valuables to the hospital.  Contacts, dentures or bridgework may not be worn into surgery.  Leave suitcase in the car. After surgery it may be brought to your room.  For patients admitted to the hospital, checkout time is 11:00 AM the day of discharge.   Patients discharged the day of surgery will not be allowed to drive home.  Name and phone number of your driver: NA  Special Instructions: CHG Shower Use Special Wash: 1/2 bottle night before surgery and 1/2 bottle morning of surgery.   Please read over the following fact sheets that you were given: Pain Booklet, Coughing and Deep Breathing, MRSA Information and Surgical Site Infection Prevention

## 2011-05-30 ENCOUNTER — Inpatient Hospital Stay (HOSPITAL_COMMUNITY)
Admission: RE | Admit: 2011-05-30 | Discharge: 2011-05-30 | Disposition: A | Discharge status: Home / Self Care | Payer: Medicare Other | Source: Ambulatory Visit | Attending: Neurosurgery | Admitting: Neurosurgery

## 2011-05-30 ENCOUNTER — Encounter (HOSPITAL_COMMUNITY): Payer: Self-pay

## 2011-05-30 HISTORY — DX: Hypothyroidism, unspecified: E03.9

## 2011-05-30 HISTORY — DX: Psoriasis, unspecified: L40.9

## 2011-05-30 HISTORY — DX: Essential (primary) hypertension: I10

## 2011-05-30 LAB — BASIC METABOLIC PANEL
BUN: 17 mg/dL (ref 6–23)
CO2: 28 mEq/L (ref 19–32)
Calcium: 10 mg/dL (ref 8.4–10.5)
Chloride: 103 mEq/L (ref 96–112)
Creatinine, Ser: 0.86 mg/dL (ref 0.50–1.10)

## 2011-05-30 LAB — CBC
HCT: 36.8 % (ref 36.0–46.0)
MCH: 31 pg (ref 26.0–34.0)
MCV: 94.4 fL (ref 78.0–100.0)
Platelets: 264 10*3/uL (ref 150–400)
RDW: 13.6 % (ref 11.5–15.5)

## 2011-05-30 LAB — SURGICAL PCR SCREEN: MRSA, PCR: NEGATIVE

## 2011-05-30 NOTE — Progress Notes (Signed)
Pt states that she had a call from Dr. Cassandria Santee office and pushed back OR time to 1415 and instructed her to arrive at Short Stay at 11:15

## 2011-06-03 MED ORDER — VANCOMYCIN HCL IN DEXTROSE 1-5 GM/200ML-% IV SOLN
1000.0000 mg | INTRAVENOUS | Status: AC
  Administered 2011-06-04: 1000 mg via INTRAVENOUS
  Filled 2011-06-03 (×3): qty 200

## 2011-06-04 ENCOUNTER — Encounter (HOSPITAL_COMMUNITY): Payer: Self-pay | Admitting: Anesthesiology

## 2011-06-04 ENCOUNTER — Encounter (HOSPITAL_COMMUNITY): Payer: Self-pay | Admitting: Surgery

## 2011-06-04 ENCOUNTER — Inpatient Hospital Stay (HOSPITAL_COMMUNITY): Payer: Medicare Other

## 2011-06-04 ENCOUNTER — Inpatient Hospital Stay (HOSPITAL_COMMUNITY): Payer: Medicare Other | Admitting: Anesthesiology

## 2011-06-04 ENCOUNTER — Encounter (HOSPITAL_COMMUNITY)
Admission: RE | Disposition: A | Discharge status: Home / Self Care | Payer: Self-pay | Source: Ambulatory Visit | Attending: Neurosurgery

## 2011-06-04 ENCOUNTER — Inpatient Hospital Stay (HOSPITAL_COMMUNITY)
Admission: RE | Admit: 2011-06-04 | Discharge: 2011-06-06 | DRG: 491 | Disposition: A | Discharge status: Home / Self Care | Payer: Medicare Other | Source: Ambulatory Visit | Attending: Neurosurgery | Admitting: Neurosurgery

## 2011-06-04 DIAGNOSIS — E039 Hypothyroidism, unspecified: Secondary | ICD-10-CM | POA: Diagnosis present

## 2011-06-04 DIAGNOSIS — L408 Other psoriasis: Secondary | ICD-10-CM | POA: Diagnosis present

## 2011-06-04 DIAGNOSIS — F3289 Other specified depressive episodes: Secondary | ICD-10-CM | POA: Diagnosis present

## 2011-06-04 DIAGNOSIS — Z7982 Long term (current) use of aspirin: Secondary | ICD-10-CM | POA: Diagnosis not present

## 2011-06-04 DIAGNOSIS — I10 Essential (primary) hypertension: Secondary | ICD-10-CM | POA: Diagnosis present

## 2011-06-04 DIAGNOSIS — Z79899 Other long term (current) drug therapy: Secondary | ICD-10-CM

## 2011-06-04 DIAGNOSIS — M4120 Other idiopathic scoliosis, site unspecified: Secondary | ICD-10-CM | POA: Diagnosis not present

## 2011-06-04 DIAGNOSIS — K219 Gastro-esophageal reflux disease without esophagitis: Secondary | ICD-10-CM | POA: Diagnosis not present

## 2011-06-04 DIAGNOSIS — M1990 Unspecified osteoarthritis, unspecified site: Secondary | ICD-10-CM | POA: Diagnosis present

## 2011-06-04 DIAGNOSIS — F329 Major depressive disorder, single episode, unspecified: Secondary | ICD-10-CM | POA: Diagnosis present

## 2011-06-04 DIAGNOSIS — M48062 Spinal stenosis, lumbar region with neurogenic claudication: Secondary | ICD-10-CM

## 2011-06-04 DIAGNOSIS — M899 Disorder of bone, unspecified: Secondary | ICD-10-CM | POA: Diagnosis present

## 2011-06-04 DIAGNOSIS — M48061 Spinal stenosis, lumbar region without neurogenic claudication: Principal | ICD-10-CM | POA: Diagnosis present

## 2011-06-04 DIAGNOSIS — M5137 Other intervertebral disc degeneration, lumbosacral region: Secondary | ICD-10-CM | POA: Diagnosis not present

## 2011-06-04 DIAGNOSIS — M949 Disorder of cartilage, unspecified: Secondary | ICD-10-CM | POA: Diagnosis present

## 2011-06-04 DIAGNOSIS — E785 Hyperlipidemia, unspecified: Secondary | ICD-10-CM | POA: Diagnosis present

## 2011-06-04 DIAGNOSIS — M545 Low back pain, unspecified: Secondary | ICD-10-CM | POA: Diagnosis not present

## 2011-06-04 DIAGNOSIS — IMO0002 Reserved for concepts with insufficient information to code with codable children: Secondary | ICD-10-CM | POA: Diagnosis not present

## 2011-06-04 DIAGNOSIS — K5900 Constipation, unspecified: Secondary | ICD-10-CM | POA: Diagnosis present

## 2011-06-04 DIAGNOSIS — Z981 Arthrodesis status: Secondary | ICD-10-CM | POA: Diagnosis not present

## 2011-06-04 HISTORY — PX: LUMBAR LAMINECTOMY/DECOMPRESSION MICRODISCECTOMY: SHX5026

## 2011-06-04 SURGERY — LUMBAR LAMINECTOMY/DECOMPRESSION MICRODISCECTOMY
Anesthesia: General | Wound class: Clean

## 2011-06-04 MED ORDER — OXYCODONE-ACETAMINOPHEN 5-325 MG PO TABS
1.0000 | ORAL_TABLET | ORAL | Status: DC | PRN
  Administered 2011-06-04 – 2011-06-05 (×3): 1 via ORAL
  Filled 2011-06-04 (×4): qty 1

## 2011-06-04 MED ORDER — SODIUM CHLORIDE 0.9 % IV SOLN: 250.0000 mL | INTRAVENOUS | Status: DC

## 2011-06-04 MED ORDER — THROMBIN 5000 UNITS EX SOLR
CUTANEOUS | Status: DC | PRN
  Administered 2011-06-04: 5000 [IU] via TOPICAL

## 2011-06-04 MED ORDER — FENTANYL CITRATE 0.05 MG/ML IJ SOLN
INTRAMUSCULAR | Status: DC | PRN
  Administered 2011-06-04 (×5): 50 ug via INTRAVENOUS

## 2011-06-04 MED ORDER — ONDANSETRON HCL 4 MG/2ML IJ SOLN: 4.0000 mg | INTRAMUSCULAR | Status: DC | PRN

## 2011-06-04 MED ORDER — ROCURONIUM BROMIDE 100 MG/10ML IV SOLN
INTRAVENOUS | Status: DC | PRN
  Administered 2011-06-04: 50 mg via INTRAVENOUS

## 2011-06-04 MED ORDER — 0.9 % SODIUM CHLORIDE (POUR BTL) OPTIME
TOPICAL | Status: DC | PRN
  Administered 2011-06-04: 1000 mL

## 2011-06-04 MED ORDER — LOSARTAN POTASSIUM 50 MG PO TABS
50.0000 mg | ORAL_TABLET | Freq: Every day | ORAL | Status: DC
  Administered 2011-06-05 – 2011-06-06 (×2): 50 mg via ORAL
  Filled 2011-06-04 (×3): qty 1

## 2011-06-04 MED ORDER — ONDANSETRON HCL 4 MG/2ML IJ SOLN: 4.0000 mg | Freq: Four times a day (QID) | INTRAMUSCULAR | Status: DC | PRN

## 2011-06-04 MED ORDER — HYDROMORPHONE HCL PF 1 MG/ML IJ SOLN
INTRAMUSCULAR | Status: AC
  Administered 2011-06-04: 0.5 mg via INTRAVENOUS
  Filled 2011-06-04: qty 1

## 2011-06-04 MED ORDER — ONDANSETRON HCL 4 MG/2ML IJ SOLN
INTRAMUSCULAR | Status: DC | PRN
  Administered 2011-06-04: 4 mg via INTRAVENOUS

## 2011-06-04 MED ORDER — FLUOXETINE HCL 10 MG PO TABS: 10.0000 mg | ORAL_TABLET | Freq: Every day | ORAL | Status: DC

## 2011-06-04 MED ORDER — CYCLOBENZAPRINE HCL 10 MG PO TABS
5.0000 mg | ORAL_TABLET | Freq: Three times a day (TID) | ORAL | Status: DC | PRN
  Administered 2011-06-04 – 2011-06-05 (×3): 5 mg via ORAL
  Filled 2011-06-04: qty 1
  Filled 2011-06-04: qty 2
  Filled 2011-06-04 (×2): qty 1

## 2011-06-04 MED ORDER — ZOLPIDEM TARTRATE 5 MG PO TABS: 5.0000 mg | ORAL_TABLET | Freq: Every evening | ORAL | Status: DC | PRN

## 2011-06-04 MED ORDER — HYDROMORPHONE HCL PF 1 MG/ML IJ SOLN
0.2500 mg | INTRAMUSCULAR | Status: DC | PRN
  Administered 2011-06-04 (×5): 0.5 mg via INTRAVENOUS

## 2011-06-04 MED ORDER — MENTHOL 3 MG MT LOZG
1.0000 | LOZENGE | OROMUCOSAL | Status: DC | PRN
  Filled 2011-06-04: qty 9

## 2011-06-04 MED ORDER — MIDAZOLAM HCL 5 MG/5ML IJ SOLN
INTRAMUSCULAR | Status: DC | PRN
  Administered 2011-06-04: 1 mg via INTRAVENOUS

## 2011-06-04 MED ORDER — SODIUM CHLORIDE 0.9 % IJ SOLN
3.0000 mL | Freq: Two times a day (BID) | INTRAMUSCULAR | Status: DC
  Administered 2011-06-05 (×2): 3 mL via INTRAVENOUS

## 2011-06-04 MED ORDER — ACETAMINOPHEN 650 MG RE SUPP: 650.0000 mg | RECTAL | Status: DC | PRN

## 2011-06-04 MED ORDER — NEOSTIGMINE METHYLSULFATE 1 MG/ML IJ SOLN
INTRAMUSCULAR | Status: DC | PRN
  Administered 2011-06-04: 4 mg via INTRAVENOUS

## 2011-06-04 MED ORDER — SODIUM CHLORIDE 0.9 % IV SOLN: INTRAVENOUS | Status: DC

## 2011-06-04 MED ORDER — CEFAZOLIN SODIUM 1-5 GM-% IV SOLN
1.0000 g | Freq: Three times a day (TID) | INTRAVENOUS | Status: AC
  Administered 2011-06-04 – 2011-06-05 (×2): 1 g via INTRAVENOUS
  Filled 2011-06-04 (×2): qty 50

## 2011-06-04 MED ORDER — DEXAMETHASONE SODIUM PHOSPHATE 4 MG/ML IJ SOLN
INTRAMUSCULAR | Status: DC | PRN
  Administered 2011-06-04: 4 mg via INTRAVENOUS

## 2011-06-04 MED ORDER — LACTATED RINGERS IV SOLN
INTRAVENOUS | Status: DC | PRN
  Administered 2011-06-04 (×2): via INTRAVENOUS

## 2011-06-04 MED ORDER — LEVOTHYROXINE SODIUM 75 MCG PO TABS
75.0000 ug | ORAL_TABLET | Freq: Every day | ORAL | Status: DC
  Administered 2011-06-05 – 2011-06-06 (×2): 75 ug via ORAL
  Filled 2011-06-04 (×2): qty 1

## 2011-06-04 MED ORDER — DOCUSATE SODIUM 100 MG PO CAPS
100.0000 mg | ORAL_CAPSULE | Freq: Two times a day (BID) | ORAL | Status: DC
  Administered 2011-06-04 – 2011-06-06 (×4): 100 mg via ORAL
  Filled 2011-06-04 (×4): qty 1

## 2011-06-04 MED ORDER — PROPOFOL 10 MG/ML IV EMUL
INTRAVENOUS | Status: DC | PRN
  Administered 2011-06-04: 150 mg via INTRAVENOUS

## 2011-06-04 MED ORDER — AMLODIPINE BESYLATE 5 MG PO TABS
5.0000 mg | ORAL_TABLET | Freq: Every day | ORAL | Status: DC
  Administered 2011-06-05 – 2011-06-06 (×2): 5 mg via ORAL
  Filled 2011-06-04 (×3): qty 1

## 2011-06-04 MED ORDER — PHENYLEPHRINE HCL 10 MG/ML IJ SOLN
INTRAMUSCULAR | Status: DC | PRN
  Administered 2011-06-04: 40 ug via INTRAVENOUS
  Administered 2011-06-04 (×2): 80 ug via INTRAVENOUS

## 2011-06-04 MED ORDER — HYDROMORPHONE HCL PF 1 MG/ML IJ SOLN
INTRAMUSCULAR | Status: AC
  Filled 2011-06-04: qty 1

## 2011-06-04 MED ORDER — GLYCOPYRROLATE 0.2 MG/ML IJ SOLN
INTRAMUSCULAR | Status: DC | PRN
  Administered 2011-06-04: .6 mg via INTRAVENOUS

## 2011-06-04 MED ORDER — MORPHINE SULFATE 4 MG/ML IJ SOLN
2.0000 mg | INTRAMUSCULAR | Status: DC | PRN
  Administered 2011-06-04 – 2011-06-05 (×3): 2 mg via INTRAVENOUS
  Filled 2011-06-04 (×3): qty 1

## 2011-06-04 MED ORDER — HEMOSTATIC AGENTS (NO CHARGE) OPTIME
TOPICAL | Status: DC | PRN
  Administered 2011-06-04: 1 via TOPICAL

## 2011-06-04 MED ORDER — PHENOL 1.4 % MT LIQD
1.0000 | OROMUCOSAL | Status: DC | PRN
  Filled 2011-06-04: qty 177

## 2011-06-04 MED ORDER — EPHEDRINE SULFATE 50 MG/ML IJ SOLN
INTRAMUSCULAR | Status: DC | PRN
  Administered 2011-06-04: 10 mg via INTRAVENOUS

## 2011-06-04 MED ORDER — FLUOXETINE HCL 10 MG PO CAPS
10.0000 mg | ORAL_CAPSULE | Freq: Every day | ORAL | Status: DC
  Administered 2011-06-05 – 2011-06-06 (×2): 10 mg via ORAL
  Filled 2011-06-04 (×2): qty 1

## 2011-06-04 MED ORDER — HYDROMORPHONE HCL PF 1 MG/ML IJ SOLN
INTRAMUSCULAR | Status: AC
Start: 2011-06-04 — End: 2011-06-04
  Administered 2011-06-04: 0.5 mg via INTRAVENOUS
  Filled 2011-06-04: qty 1

## 2011-06-04 MED ORDER — SIMVASTATIN 20 MG PO TABS
20.0000 mg | ORAL_TABLET | Freq: Every day | ORAL | Status: DC
  Administered 2011-06-04 – 2011-06-06 (×3): 20 mg via ORAL
  Filled 2011-06-04 (×4): qty 1

## 2011-06-04 MED ORDER — HYDROMORPHONE HCL PF 1 MG/ML IJ SOLN
0.5000 mg | INTRAMUSCULAR | Status: AC
  Administered 2011-06-04: 0.5 mg via INTRAVENOUS

## 2011-06-04 MED ORDER — ACETAMINOPHEN 325 MG PO TABS
650.0000 mg | ORAL_TABLET | ORAL | Status: DC | PRN
  Administered 2011-06-05 (×2): 325 mg via ORAL
  Filled 2011-06-04 (×3): qty 1

## 2011-06-04 MED ORDER — SODIUM CHLORIDE 0.9 % IJ SOLN: 3.0000 mL | INTRAMUSCULAR | Status: DC | PRN

## 2011-06-04 SURGICAL SUPPLY — 57 items
APL SKNCLS STERI-STRIP NONHPOA (GAUZE/BANDAGES/DRESSINGS) ×1
BENZOIN TINCTURE PRP APPL 2/3 (GAUZE/BANDAGES/DRESSINGS) ×2 IMPLANT
BLADE SURG ROTATE 9660 (MISCELLANEOUS) IMPLANT
BUR ACORN 6.0 (BURR) ×2 IMPLANT
BUR MATCHSTICK NEURO 3.0 LAGG (BURR) ×2 IMPLANT
CANISTER SUCTION 2500CC (MISCELLANEOUS) ×2 IMPLANT
CLOTH BEACON ORANGE TIMEOUT ST (SAFETY) ×2 IMPLANT
CONT SPEC 4OZ CLIKSEAL STRL BL (MISCELLANEOUS) ×2 IMPLANT
DRAPE LAPAROTOMY 100X72X124 (DRAPES) ×2 IMPLANT
DRAPE MICROSCOPE LEICA (MISCELLANEOUS) ×2 IMPLANT
DRAPE POUCH INSTRU U-SHP 10X18 (DRAPES) ×2 IMPLANT
DRSG PAD ABDOMINAL 8X10 ST (GAUZE/BANDAGES/DRESSINGS) ×2 IMPLANT
DURAPREP 26ML APPLICATOR (WOUND CARE) ×2 IMPLANT
DURASEAL SPINE SEALANT 3ML (MISCELLANEOUS) ×2 IMPLANT
ELECT REM PT RETURN 9FT ADLT (ELECTROSURGICAL) ×2
ELECTRODE REM PT RTRN 9FT ADLT (ELECTROSURGICAL) ×1 IMPLANT
GAUZE SPONGE 4X4 16PLY XRAY LF (GAUZE/BANDAGES/DRESSINGS) IMPLANT
GLOVE BIOGEL M 8.0 STRL (GLOVE) ×2 IMPLANT
GLOVE BIOGEL PI IND STRL 7.0 (GLOVE) ×1 IMPLANT
GLOVE BIOGEL PI INDICATOR 7.0 (GLOVE) ×1
GLOVE ECLIPSE 6.5 STRL STRAW (GLOVE) ×2 IMPLANT
GLOVE EXAM NITRILE LRG STRL (GLOVE) IMPLANT
GLOVE EXAM NITRILE MD LF STRL (GLOVE) IMPLANT
GLOVE EXAM NITRILE XL STR (GLOVE) IMPLANT
GLOVE EXAM NITRILE XS STR PU (GLOVE) IMPLANT
GLOVE SURG SS PI 6.5 STRL IVOR (GLOVE) ×4 IMPLANT
GOWN BRE IMP SLV AUR LG STRL (GOWN DISPOSABLE) ×2 IMPLANT
GOWN BRE IMP SLV AUR XL STRL (GOWN DISPOSABLE) ×2 IMPLANT
GOWN STRL REIN 2XL LVL4 (GOWN DISPOSABLE) IMPLANT
KIT BASIN OR (CUSTOM PROCEDURE TRAY) ×2 IMPLANT
KIT ROOM TURNOVER OR (KITS) ×2 IMPLANT
NEEDLE HYPO 18GX1.5 BLUNT FILL (NEEDLE) IMPLANT
NEEDLE HYPO 21X1.5 SAFETY (NEEDLE) IMPLANT
NEEDLE HYPO 25X1 1.5 SAFETY (NEEDLE) ×2 IMPLANT
NEEDLE SPNL 20GX3.5 QUINCKE YW (NEEDLE) IMPLANT
NS IRRIG 1000ML POUR BTL (IV SOLUTION) ×2 IMPLANT
PACK LAMINECTOMY NEURO (CUSTOM PROCEDURE TRAY) ×2 IMPLANT
PAD ARMBOARD 7.5X6 YLW CONV (MISCELLANEOUS) ×6 IMPLANT
PATTIES SURGICAL .5 X1 (DISPOSABLE) ×2 IMPLANT
RUBBERBAND STERILE (MISCELLANEOUS) ×4 IMPLANT
SPONGE GAUZE 4X4 12PLY (GAUZE/BANDAGES/DRESSINGS) ×2 IMPLANT
SPONGE LAP 4X18 X RAY DECT (DISPOSABLE) IMPLANT
SPONGE SURGIFOAM ABS GEL SZ50 (HEMOSTASIS) ×2 IMPLANT
STAPLER SKIN PROX WIDE 3.9 (STAPLE) ×2 IMPLANT
STRIP CLOSURE SKIN 1/2X4 (GAUZE/BANDAGES/DRESSINGS) ×2 IMPLANT
SUT PROLENE 6 0 BV (SUTURE) ×2 IMPLANT
SUT VIC AB 0 CT1 18XCR BRD8 (SUTURE) ×1 IMPLANT
SUT VIC AB 0 CT1 8-18 (SUTURE) ×2
SUT VIC AB 2-0 CP2 18 (SUTURE) ×2 IMPLANT
SUT VIC AB 3-0 SH 8-18 (SUTURE) ×2 IMPLANT
SYR 20CC LL (SYRINGE) IMPLANT
SYR 20ML ECCENTRIC (SYRINGE) ×2 IMPLANT
SYR 5ML LL (SYRINGE) IMPLANT
TAPE CLOTH SURG 4X10 WHT LF (GAUZE/BANDAGES/DRESSINGS) ×2 IMPLANT
TOWEL OR 17X24 6PK STRL BLUE (TOWEL DISPOSABLE) ×2 IMPLANT
TOWEL OR 17X26 10 PK STRL BLUE (TOWEL DISPOSABLE) ×2 IMPLANT
WATER STERILE IRR 1000ML POUR (IV SOLUTION) ×2 IMPLANT

## 2011-06-04 NOTE — H&P (Signed)
Becky Duran is an 78 y.o. female.   Chief omplaint: lumbar pain HPI: lbp with radiation to both lower extremities but worse going to the right hip.has had epidural injections with no benefit. Mri done  Past Medical History  Diagnosis Date  . Depression   . GERD (gastroesophageal reflux disease)   . Thyroid disease     hypothyroidism  . Hyperlipidemia   . Endometriosis   . Osteoarthritis   . Osteopenia   . Scoliosis   . Kidney stones   . Hypothyroidism   . Hypertension   . H/O hiatal hernia     small Hital Hernia  . Psoriasis     Chest, Back and face  . Constipation   . Heel fracture   . Psoriasis     dx'd @10yrs . ago.    Past Surgical History  Procedure Date  . Lipoma excision 2012     Back  . Abdominal hysterectomy   . Cardiac catheterization 2011  . Eye surgery 4 years ago    cataract with lens- bil  . Breast lumpectomy     20 years ago- bengin  . Nephrolithotomy     for kidney stone- > 2 years.    Family History  Problem Relation Age of Onset  . Anesthesia problems Mother    Social History:  reports that she has never smoked. She does not have any smokeless tobacco history on file. She reports that she does not drink alcohol or use illicit drugs.  Allergies:  Allergies  Allergen Reactions  . Ciprofloxacin     Metallic taste  . Etodolac     Spiked blood pressure   . Penicillins Hives  . Relafen (Nabumetone)   . Sulfa Drugs Cross Reactors     diarrhea  . Valtrex     Possible confusion  . Voltaren     Afraid bp would spike  . Lisinopril Cough    Medications Prior to Admission  Medication Dose Route Frequency Provider Last Rate Last Dose  . HYDROmorphone (DILAUDID) injection 0.25-0.5 mg  0.25-0.5 mg Intravenous Q5 min PRN Raiford Simmonds, MD      . ondansetron [facility]) injection 4 mg  4 mg Intravenous Q6H PRN Raiford Simmonds, MD      . vancomycin (VANCOCIN) IVPB 1000 mg/200 mL premix  1,000 mg Intravenous 120 min pre-op Karn Cassis, MD        Medications Prior to Admission  Medication Sig Dispense Refill  . acetaminophen (TYLENOL) 650 MG CR tablet Take 650 mg by mouth every 8 (eight) hours as needed. pain      . amLODipine (NORVASC) 5 MG tablet Take 5 mg by mouth daily.        Marland Kitchen aspirin 81 MG tablet Take 81 mg by mouth daily.        Marland Kitchen atorvastatin (LIPITOR) 20 MG tablet Take 20 mg by mouth daily.        Marland Kitchen FLUoxetine (PROZAC) 10 MG tablet Take 10 mg by mouth daily.        Marland Kitchen levothyroxine (SYNTHROID, LEVOTHROID) 75 MCG tablet Take 75 mcg by mouth daily.        Marland Kitchen losartan (COZAAR) 50 MG tablet Take 50 mg by mouth daily.      Marland Kitchen oxyCODONE-acetaminophen (PERCOCET) 10-325 MG per tablet Take 1 tablet by mouth every 6 (six) hours as needed. pain        No results found for this or any previous visit (from the past 48  hour(s)). No results found.  Review of Systems  Constitutional: Negative.   HENT: Negative.   Respiratory: Negative.   Cardiovascular: Negative.        Arterial hypertension  Gastrointestinal: Negative.   Genitourinary:       Kidney stones  Musculoskeletal: Positive for back pain.  Skin: Negative.   Neurological: Negative.   Endo/Heme/Allergies: Negative.   Psychiatric/Behavioral: Negative.     Blood pressure 128/88, pulse 73, temperature 97.9 F (36.6 C), temperature source Oral, resp. rate 18, SpO2 98.00%. Physical Exam hent.nl. Neck,nl. Lungs,nl.cv,nl. Abdomen ,nl. Extremities,nl. NEURO femoral stretch positive bilaterally.  Assessment/Plan Mri lumbar spine, scoliosis severe stenosis at l3 4 and l4 5. . Patient will have laminectomies and foraminotomies at l4 5 and patial at [facility]. THE PROCEDURE WILL NOT CORRECT THE SCOLIOSIS.  Romie Tay M 06/04/2011, 12:55 PM

## 2011-06-04 NOTE — Transfer of Care (Signed)
Immediate Anesthesia Transfer of Care Note  Patient: Becky Duran  Procedure(s) Performed:  LUMBAR LAMINECTOMY/DECOMPRESSION MICRODISCECTOMY - Lumbar Four-Five partial Lumbar Three Laminectomy  Patient Location: PACU  Anesthesia Type: General  Level of Consciousness: awake, alert , oriented and patient cooperative  Airway & Oxygen Therapy: Patient Spontanous Breathing and Patient connected to nasal cannula oxygen  Post-op Assessment: Report given to PACU RN, Post -op Vital signs reviewed and stable and Patient moving all extremities X 4  Post vital signs: Reviewed and stable  Complications: No apparent anesthesia complications

## 2011-06-04 NOTE — Anesthesia Preprocedure Evaluation (Signed)
Anesthesia Evaluation  Patient identified by MRN, date of birth, ID band Patient awake    Reviewed: Allergy & Precautions, H&P , NPO status , Patient's Chart, lab work & pertinent test results  Airway Mallampati: II  Neck ROM: full    Dental   Pulmonary          Cardiovascular hypertension,     Neuro/Psych PSYCHIATRIC DISORDERS Depression    GI/Hepatic hiatal hernia, GERD-  ,  Endo/Other  Hypothyroidism   Renal/GU      Musculoskeletal   Abdominal   Peds  Hematology   Anesthesia Other Findings   Reproductive/Obstetrics                           Anesthesia Physical Anesthesia Plan  ASA: II  Anesthesia Plan: General   Post-op Pain Management:    Induction: Intravenous  Airway Management Planned: Oral ETT  Additional Equipment:   Intra-op Plan:   Post-operative Plan: Extubation in OR  Informed Consent: I have reviewed the patients History and Physical, chart, labs and discussed the procedure including the risks, benefits and alternatives for the proposed anesthesia with the patient or authorized representative who has indicated his/her understanding and acceptance.     Plan Discussed with: CRNA and Surgeon  Anesthesia Plan Comments:         Anesthesia Quick Evaluation

## 2011-06-04 NOTE — Preoperative (Signed)
Beta Blockers   Reason not to administer Beta Blockers:Not Applicable 

## 2011-06-04 NOTE — Anesthesia Postprocedure Evaluation (Signed)
Anesthesia Post Note  Patient: Becky Duran  Procedure(s) Performed:  LUMBAR LAMINECTOMY/DECOMPRESSION MICRODISCECTOMY - Lumbar Four-Five partial Lumbar Three Laminectomy  Anesthesia type: General  Patient location: PACU  Post pain: Pain level controlled and Adequate analgesia  Post assessment: Post-op Vital signs reviewed, Patient's Cardiovascular Status Stable, Respiratory Function Stable, Patent Airway and Pain level controlled  Last Vitals:  Filed Vitals:   06/04/11 1526  BP:   Pulse:   Temp: 36.9 C  Resp:     Post vital signs: Reviewed and stable  Level of consciousness: awake, alert  and oriented  Complications: No apparent anesthesia complications

## 2011-06-04 NOTE — H&P (Signed)
See h/p

## 2011-06-04 NOTE — Progress Notes (Signed)
Laminectomies l3 to l5. Op number [id number]

## 2011-06-05 ENCOUNTER — Encounter (HOSPITAL_COMMUNITY): Payer: Self-pay | Admitting: *Deleted

## 2011-06-05 MED ORDER — OXYCODONE-ACETAMINOPHEN 5-325 MG PO TABS
1.0000 | ORAL_TABLET | ORAL | Status: DC | PRN
  Administered 2011-06-05 (×3): 1 via ORAL
  Administered 2011-06-06: 2 via ORAL
  Administered 2011-06-06 (×3): 1 via ORAL
  Filled 2011-06-05 (×2): qty 2
  Filled 2011-06-05 (×5): qty 1

## 2011-06-05 MED ORDER — CYCLOBENZAPRINE HCL 10 MG PO TABS
10.0000 mg | ORAL_TABLET | Freq: Three times a day (TID) | ORAL | Status: DC | PRN
  Administered 2011-06-05: 5 mg via ORAL
  Administered 2011-06-06 (×2): 10 mg via ORAL
  Filled 2011-06-05 (×3): qty 1

## 2011-06-05 NOTE — Progress Notes (Signed)
Patient ID: Becky Duran, female   DOB: 1933/03/29, 78 y.o.   MRN: [id number] C/o incisional pain. No weakness. Seen by pt. Dressing to changed prn.

## 2011-06-05 NOTE — Op Note (Signed)
NAMEEMELYNN, RANCE                ACCOUNT NO.:  [id number]  MEDICAL RECORD NO.:  [id number]  LOCATION:  3007                         FACILITY:  [facility]  PHYSICIAN:  Hilda Lias, M.D.   DATE OF BIRTH:  10/09/1932  DATE OF PROCEDURE:  06/04/2011 DATE OF DISCHARGE:                              OPERATIVE REPORT   PREOPERATIVE DIAGNOSES:  Lumbar stenosis, L3-4, L4-5 with radiculopathy, scoliosis, chronic back pain.  POSTOPERATIVE DIAGNOSES:  Lumbar stenosis, L3-4, L4-5 with radiculopathy, scoliosis, chronic back pain.  PROCEDURE:  Bilateral L4-5, L5-S1, L3 partial laminectomy, foraminotomy to decompress the L3, L4, L5, and S1 nerve root.  Microscope.  SURGEON:  Hilda Lias, MD  ASSISTANT:  Coletta Memos, MD.  CLINICAL HISTORY:  Ms. Sharrow is a 78 year old female with chronic back pain.  We know that she has severe scoliosis.  Lately, she is having back pain, radiation to both legs associated with weakness of both feet. X-rays normally showed scoliosis, but stenosis at the level of L3-4 and L4-5 with foraminal narrowing bilaterally worse on the right side. Surgery was advised.  She knew that the procedure was to help with the leg pain and it will not correct whatsoever the scoliosis.  She declined fusion.  PROCEDURE IN DETAIL:  The patient was taken to the OR, and after intubation, she was positioned in a prone manner.  The back was cleaned with DuraPrep.  We were unable to feel any spinous process.  A midline incision was made through the skin, subcutaneous tissue, through a deep adipose tissue into the muscle.  X-rays showed that the clip was at the level of L3.  From then on, we removed the spinous process of L4-L5 and part of L3.  With the drill, we drilled the lamina of L3 bilaterally avoiding the facet.  We brought the microscope into the area.  The patient had a quite a bit of thick hypertrophy of the yellow ligament. Removal was accomplished with a drill laterally to  stay away from the facet.  Decompression of the thecal sac was done.  Once we opened the area of L4-5, we found some arachnoid pouch with fluid coming through. This is the area where the patient had the myelogram according to the x- ray.  Nevertheless, we continued our decompression with the foraminotomy.  We decompressed the L3, L4, and L5 as well as the S1 nerve root bilaterally.  Just to be sure that she do not develop any CSF leak, we used a single stitch of Prolene followed by surgical glue. Valsalva maneuver was negative.  The area was irrigated and the wound was closed in different layers of Vicryl and staples.  The patient did well.         ______________________________ Hilda Lias, M.D.    EB/MEDQ  D:  06/04/2011  T:  06/05/2011  Job:  [id number]

## 2011-06-05 NOTE — Progress Notes (Signed)
Physical Therapy Evaluation Patient Details Name: Becky Duran MRN: [id number] DOB: 05/10/1932 Today's Date: 06/05/2011  Problem List: There is no problem list on file for this patient.   Past Medical History:  Past Medical History  Diagnosis Date  . Depression   . GERD (gastroesophageal reflux disease)   . Thyroid disease     hypothyroidism  . Hyperlipidemia   . Endometriosis   . Osteoarthritis   . Osteopenia   . Scoliosis   . Kidney stones   . Hypothyroidism   . Hypertension   . H/O hiatal hernia     small Hital Hernia  . Psoriasis     Chest, Back and face  . Constipation   . Heel fracture   . Psoriasis     dx'd @10yrs . ago.   Past Surgical History:  Past Surgical History  Procedure Date  . Lipoma excision 2012     Back  . Abdominal hysterectomy   . Cardiac catheterization 2011  . Eye surgery 4 years ago    cataract with lens- bil  . Breast lumpectomy     20 years ago- bengin  . Nephrolithotomy     for kidney stone- > 2 years.    PT Assessment/Plan/Recommendation PT Assessment Clinical Impression Statement: Pt presents with a medical diagnosis of Lumbar Four-Five partial Lumbar Three Laminectomy. Pt is at her baseline functional level, all education completed PT Recommendation/Assessment: Patent does not need any further PT services No Skilled PT: All education completed;Patient will have necessary level of assist by caregiver at discharge;Patient at baseline level of functioning PT Recommendation Follow Up Recommendations: No PT follow up Equipment Recommended: None recommended by PT PT Goals     PT Evaluation Precautions/Restrictions  Precautions Precautions: Back Precaution Comments: pt able to demonstrate 3/3 back precautions Required Braces or Orthoses: Yes Spinal Brace: Lumbar corset;Applied in sitting position Restrictions Weight Bearing Restrictions: No Prior Functioning  Home Living Lives With: Spouse Type of Home: House Home Layout:  One level Home Access: Stairs to enter Entrance Stairs-Rails: None Entrance Stairs-Number of Steps: 1 Bathroom Shower/Tub: Tub/shower unit;Walk-in shower Bathroom Toilet: Handicapped height Home Adaptive Equipment: Straight cane Prior Function Level of Independence: Independent with basic ADLs;Independent with homemaking with ambulation;Independent with gait;Independent with transfers Able to Take Stairs?: Yes Driving: Yes Vocation: Retired Producer, television/film/video: Awake/alert Overall Cognitive Status: Appears within functional limits for tasks assessed Orientation Level: Oriented X4 Sensation/Coordination Sensation Light Touch: Appears Intact Coordination Gross Motor Movements are Fluid and Coordinated: Yes Fine Motor Movements are Fluid and Coordinated: Yes Extremity Assessment RUE Assessment RUE Assessment: Within Functional Limits LUE Assessment LUE Assessment: Within Functional Limits RLE Assessment RLE Assessment: Within Functional Limits LLE Assessment LLE Assessment: Within Functional Limits Mobility (including Balance) Bed Mobility Bed Mobility: Yes Rolling Left: 5: Supervision Rolling Left Details (indicate cue type and reason): VC for log rolling technique as well as ways to complete while maintaining back precautions Left Sidelying to Sit: 4: Min assist;HOB flat Left Sidelying to Sit Details (indicate cue type and reason): Min assist to bring trunk of bed while keeping back straight. Sitting - Scoot to Edge of Bed: 5: Supervision Sitting - Scoot to [place] of Bed Details (indicate cue type and reason): VC for hand placement technique Transfers Transfers: Yes Sit to Stand: 5: Supervision;From bed Sit to Stand Details (indicate cue type and reason): VC for hand placement for safety Stand to Sit: 5: Supervision;To bed Stand to Sit Details: VC for hand placement for safety Ambulation/Gait Ambulation/Gait: Yes  Ambulation/Gait Assistance: 5:  Supervision Ambulation/Gait Assistance Details (indicate cue type and reason): Supervision for safety secondary to back pain during ambulation Ambulation Distance (Feet): 200 Feet Assistive device: None Gait Pattern: Within Functional Limits Gait velocity: Decreased gait speed Stairs: Yes Stairs Assistance: 5: Supervision Stairs Assistance Details (indicate cue type and reason): Supervision for safety. VC for proper stair sequencing Stair Management Technique: No rails;Forwards Number of Stairs: 2     Exercise    End of Session PT - End of Session Equipment Utilized During Treatment: Gait belt Activity Tolerance: Patient tolerated treatment well Patient left: in bed;with call bell in reach;with family/visitor present Nurse Communication: Mobility status for transfers;Mobility status for ambulation General Behavior During Session: [facility] for tasks performed Cognition: [facility] for tasks performed  Milana Kidney 06/05/2011, 1:28 PM

## 2011-06-05 NOTE — Progress Notes (Signed)
Clinical Social Worker received consult for "SNF." PT is not recommending follow up. CSW consulted with RNCM and bedside RN, as well as PT. Pt plans to discharge home when medically ready. CSW is signing off as no further social work needs identified. Please reconsult social work if a need arises prior to discharge.   Dede Query, MSW, Theresia Majors [phone]

## 2011-06-05 NOTE — Progress Notes (Signed)
Occupational Therapy Evaluation Patient Details Name: Becky Duran MRN: [id number] DOB: February 16, 1933 Today's Date: 06/05/2011  Problem List: There is no problem list on file for this patient.   Past Medical History:  Past Medical History  Diagnosis Date  . Depression   . GERD (gastroesophageal reflux disease)   . Thyroid disease     hypothyroidism  . Hyperlipidemia   . Endometriosis   . Osteoarthritis   . Osteopenia   . Scoliosis   . Kidney stones   . Hypothyroidism   . Hypertension   . H/O hiatal hernia     small Hital Hernia  . Psoriasis     Chest, Back and face  . Constipation   . Heel fracture   . Psoriasis     dx'd @10yrs . ago.   Past Surgical History:  Past Surgical History  Procedure Date  . Lipoma excision 2012     Back  . Abdominal hysterectomy   . Cardiac catheterization 2011  . Eye surgery 4 years ago    cataract with lens- bil  . Breast lumpectomy     20 years ago- bengin  . Nephrolithotomy     for kidney stone- > 2 years.    OT Assessment/Plan/Recommendation OT Assessment Clinical Impression Statement: Pt s/p lumbar laminectomy/decompression midcrodiscectomy.  Pt is min A with LB ADL activity and supervision with fucntional transfers.  Pt will have necessary level of assistance from husband with ADLs upon d/c home.  All education complete and pt has no further acute OT needs  OT Recommendation/Assessment: Patient does not need any further OT services OT Recommendation Follow Up Recommendations: Supervision/Assistance - 24 hour Equipment Recommended: None recommended by PT OT Goals    OT Evaluation Precautions/Restrictions  Precautions Precautions: Back Precaution Comments: pt able to demonstrate 3/3 back precautions Required Braces or Orthoses: Yes Spinal Brace: Lumbar corset;Applied in sitting position Restrictions Weight Bearing Restrictions: No Prior Functioning Home Living Lives With: Spouse Type of Home: House Home Layout: One  level Home Access: Stairs to enter Entrance Stairs-Rails: None Entrance Stairs-Number of Steps: 1 Bathroom Shower/Tub: Tub/shower unit;Walk-in shower Bathroom Toilet: Handicapped height Home Adaptive Equipment: Straight cane Prior Function Level of Independence: Independent with basic ADLs;Independent with homemaking with ambulation;Independent with gait;Independent with transfers Able to Take Stairs?: Yes Driving: Yes Vocation: Retired ADL ADL Grooming: Performed;Modified independent;Wash/dry hands;Wash/dry face Where Assessed - Grooming: Standing at sink Lower Body Bathing: Simulated;Minimal assistance Lower Body Bathing Details (indicate cue type and reason): Unable to reach bil. feet when crossing legs. Where Assessed - Lower Body Bathing: Sitting, bed Lower Body Dressing: Simulated;Minimal assistance Lower Body Dressing Details (indicate cue type and reason): Unable to reach bil. feet with legs crossed Where Assessed - Lower Body Dressing: Sitting, bed Toilet Transfer: Simulated;Supervision/safety Toilet Transfer Details (indicate cue type and reason): Supervision for safety during ambulation throughout room Toilet Transfer Method: Ambulating ADL Comments: Pt donned back brace with min assist to tighten brace sitting EOB. Pt and spouse report that spouse will assist with LB ADLs. Vision/Perception    Cognition Cognition Arousal/Alertness: Awake/alert Overall Cognitive Status: Appears within functional limits for tasks assessed Orientation Level: Oriented X4 Sensation/Coordination Sensation Light Touch: Appears Intact Coordination Gross Motor Movements are Fluid and Coordinated: Yes Fine Motor Movements are Fluid and Coordinated: Yes Extremity Assessment RUE Assessment RUE Assessment: Within Functional Limits LUE Assessment LUE Assessment: Within Functional Limits Mobility  Bed Mobility Bed Mobility: Yes Rolling Left: 5: Supervision Rolling Left Details (indicate  cue type and reason): VC for  log rolling technique as well as ways to complete while maintaining back precautions Left Sidelying to Sit: 4: Min assist;HOB flat Left Sidelying to Sit Details (indicate cue type and reason): Min assist to bring trunk of bed while keeping back straight. Sitting - Scoot to Edge of Bed: 5: Supervision Sitting - Scoot to [place] of Bed Details (indicate cue type and reason): VC for hand placement technique Transfers Transfers: Yes Sit to Stand: 5: Supervision;From bed Sit to Stand Details (indicate cue type and reason): VC for hand placement for safety Stand to Sit: 5: Supervision;To bed Stand to Sit Details: VC for hand placement for safety Exercises   End of Session OT - End of Session Equipment Utilized During Treatment: Back brace Activity Tolerance: Patient tolerated treatment well Patient left: in bed;with call bell in reach;with family/visitor present General Behavior During Session: [facility] for tasks performed Cognition: [facility] for tasks performed   Cipriano Mile 06/05/2011, 1:33 PM  06/05/2011 Cipriano Mile OTR/L Pager [phone] Office [phone]

## 2011-06-06 ENCOUNTER — Encounter (HOSPITAL_COMMUNITY): Payer: Self-pay | Admitting: Neurosurgery

## 2011-06-06 NOTE — Progress Notes (Signed)
Patient discharged home with instructions, prescriptions and follow up appoints.  No questions at this time.  Information provided to patient and spouse at bedside.  Patietn left unit in wheelchair in stable condition with personal belongings.  Osvaldo Angst, RN---------------

## 2011-06-06 NOTE — Discharge Summary (Signed)
  Admission and final diadnosis lumbar stenosis and scoliosis. Medications percocet and diazepam. Diet as preop. Activity, no driving for 4 weeks. Condition at dcno pain in legs. F/u 4 weeks or before in [place] office

## 2011-06-06 NOTE — Progress Notes (Signed)
CARE MANAGEMENT NOTE 06/06/2011  Discharge planning. No home health needs identified.

## 2011-07-31 DIAGNOSIS — IMO0002 Reserved for concepts with insufficient information to code with codable children: Secondary | ICD-10-CM | POA: Diagnosis not present

## 2011-08-11 DIAGNOSIS — H26499 Other secondary cataract, unspecified eye: Secondary | ICD-10-CM | POA: Diagnosis not present

## 2011-08-22 DIAGNOSIS — M5137 Other intervertebral disc degeneration, lumbosacral region: Secondary | ICD-10-CM | POA: Diagnosis not present

## 2011-08-22 DIAGNOSIS — M961 Postlaminectomy syndrome, not elsewhere classified: Secondary | ICD-10-CM | POA: Diagnosis not present

## 2011-08-22 DIAGNOSIS — M47817 Spondylosis without myelopathy or radiculopathy, lumbosacral region: Secondary | ICD-10-CM | POA: Diagnosis not present

## 2011-09-04 DIAGNOSIS — E039 Hypothyroidism, unspecified: Secondary | ICD-10-CM | POA: Diagnosis not present

## 2011-09-04 DIAGNOSIS — I10 Essential (primary) hypertension: Secondary | ICD-10-CM | POA: Diagnosis not present

## 2011-09-04 DIAGNOSIS — E785 Hyperlipidemia, unspecified: Secondary | ICD-10-CM | POA: Diagnosis not present

## 2011-09-04 DIAGNOSIS — M2550 Pain in unspecified joint: Secondary | ICD-10-CM | POA: Diagnosis not present

## 2011-09-09 DIAGNOSIS — Z1231 Encounter for screening mammogram for malignant neoplasm of breast: Secondary | ICD-10-CM | POA: Diagnosis not present

## 2011-10-23 DIAGNOSIS — G894 Chronic pain syndrome: Secondary | ICD-10-CM | POA: Diagnosis not present

## 2011-10-31 DIAGNOSIS — M961 Postlaminectomy syndrome, not elsewhere classified: Secondary | ICD-10-CM | POA: Diagnosis not present

## 2011-10-31 DIAGNOSIS — M5137 Other intervertebral disc degeneration, lumbosacral region: Secondary | ICD-10-CM | POA: Diagnosis not present

## 2011-11-12 DIAGNOSIS — N390 Urinary tract infection, site not specified: Secondary | ICD-10-CM | POA: Diagnosis not present

## 2011-11-12 DIAGNOSIS — M1990 Unspecified osteoarthritis, unspecified site: Secondary | ICD-10-CM | POA: Diagnosis not present

## 2011-12-20 DIAGNOSIS — N390 Urinary tract infection, site not specified: Secondary | ICD-10-CM | POA: Diagnosis not present

## 2011-12-20 DIAGNOSIS — R300 Dysuria: Secondary | ICD-10-CM | POA: Diagnosis not present

## 2012-01-06 DIAGNOSIS — M549 Dorsalgia, unspecified: Secondary | ICD-10-CM | POA: Diagnosis not present

## 2012-01-06 DIAGNOSIS — N390 Urinary tract infection, site not specified: Secondary | ICD-10-CM | POA: Diagnosis not present

## 2012-01-06 DIAGNOSIS — I10 Essential (primary) hypertension: Secondary | ICD-10-CM | POA: Diagnosis not present

## 2012-01-06 DIAGNOSIS — N318 Other neuromuscular dysfunction of bladder: Secondary | ICD-10-CM | POA: Diagnosis not present

## 2012-01-06 DIAGNOSIS — R51 Headache: Secondary | ICD-10-CM | POA: Diagnosis not present

## 2012-01-23 DIAGNOSIS — M5137 Other intervertebral disc degeneration, lumbosacral region: Secondary | ICD-10-CM | POA: Diagnosis not present

## 2012-01-23 DIAGNOSIS — M961 Postlaminectomy syndrome, not elsewhere classified: Secondary | ICD-10-CM | POA: Diagnosis not present

## 2012-01-23 DIAGNOSIS — G894 Chronic pain syndrome: Secondary | ICD-10-CM | POA: Diagnosis not present

## 2012-02-17 DIAGNOSIS — M5137 Other intervertebral disc degeneration, lumbosacral region: Secondary | ICD-10-CM | POA: Diagnosis not present

## 2012-02-24 DIAGNOSIS — E782 Mixed hyperlipidemia: Secondary | ICD-10-CM | POA: Diagnosis not present

## 2012-02-24 DIAGNOSIS — I10 Essential (primary) hypertension: Secondary | ICD-10-CM | POA: Diagnosis not present

## 2012-02-27 DIAGNOSIS — M545 Low back pain: Secondary | ICD-10-CM | POA: Diagnosis not present

## 2012-02-27 DIAGNOSIS — M79609 Pain in unspecified limb: Secondary | ICD-10-CM | POA: Diagnosis not present

## 2012-02-27 DIAGNOSIS — M5137 Other intervertebral disc degeneration, lumbosacral region: Secondary | ICD-10-CM | POA: Diagnosis not present

## 2012-02-27 DIAGNOSIS — G589 Mononeuropathy, unspecified: Secondary | ICD-10-CM | POA: Diagnosis not present

## 2012-03-17 DIAGNOSIS — M5137 Other intervertebral disc degeneration, lumbosacral region: Secondary | ICD-10-CM | POA: Diagnosis not present

## 2012-03-18 DIAGNOSIS — I10 Essential (primary) hypertension: Secondary | ICD-10-CM | POA: Diagnosis not present

## 2012-03-18 DIAGNOSIS — M549 Dorsalgia, unspecified: Secondary | ICD-10-CM | POA: Diagnosis not present

## 2012-03-18 DIAGNOSIS — Z1211 Encounter for screening for malignant neoplasm of colon: Secondary | ICD-10-CM | POA: Diagnosis not present

## 2012-03-18 DIAGNOSIS — Z23 Encounter for immunization: Secondary | ICD-10-CM | POA: Diagnosis not present

## 2012-03-18 DIAGNOSIS — Z0000 Encounter for general adult medical examination without abnormal findings: Secondary | ICD-10-CM | POA: Diagnosis not present

## 2012-03-18 DIAGNOSIS — F329 Major depressive disorder, single episode, unspecified: Secondary | ICD-10-CM | POA: Diagnosis not present

## 2012-03-18 DIAGNOSIS — E785 Hyperlipidemia, unspecified: Secondary | ICD-10-CM | POA: Diagnosis not present

## 2012-03-18 DIAGNOSIS — E039 Hypothyroidism, unspecified: Secondary | ICD-10-CM | POA: Diagnosis not present

## 2012-04-07 DIAGNOSIS — N390 Urinary tract infection, site not specified: Secondary | ICD-10-CM | POA: Diagnosis not present

## 2012-05-07 DIAGNOSIS — M5137 Other intervertebral disc degeneration, lumbosacral region: Secondary | ICD-10-CM | POA: Diagnosis not present

## 2012-05-07 DIAGNOSIS — M961 Postlaminectomy syndrome, not elsewhere classified: Secondary | ICD-10-CM | POA: Diagnosis not present

## 2012-05-11 DIAGNOSIS — M899 Disorder of bone, unspecified: Secondary | ICD-10-CM | POA: Diagnosis not present

## 2012-05-11 DIAGNOSIS — M949 Disorder of cartilage, unspecified: Secondary | ICD-10-CM | POA: Diagnosis not present

## 2012-06-10 DIAGNOSIS — R5381 Other malaise: Secondary | ICD-10-CM | POA: Diagnosis not present

## 2012-06-10 DIAGNOSIS — M064 Inflammatory polyarthropathy: Secondary | ICD-10-CM | POA: Diagnosis not present

## 2012-06-10 DIAGNOSIS — M1990 Unspecified osteoarthritis, unspecified site: Secondary | ICD-10-CM | POA: Diagnosis not present

## 2012-06-10 DIAGNOSIS — M2550 Pain in unspecified joint: Secondary | ICD-10-CM | POA: Diagnosis not present

## 2012-06-10 DIAGNOSIS — L408 Other psoriasis: Secondary | ICD-10-CM | POA: Diagnosis not present

## 2012-06-10 DIAGNOSIS — M545 Low back pain: Secondary | ICD-10-CM | POA: Diagnosis not present

## 2012-07-06 DIAGNOSIS — L408 Other psoriasis: Secondary | ICD-10-CM | POA: Diagnosis not present

## 2012-08-10 DIAGNOSIS — M2550 Pain in unspecified joint: Secondary | ICD-10-CM | POA: Diagnosis not present

## 2012-08-10 DIAGNOSIS — M1990 Unspecified osteoarthritis, unspecified site: Secondary | ICD-10-CM | POA: Diagnosis not present

## 2012-08-10 DIAGNOSIS — L4050 Arthropathic psoriasis, unspecified: Secondary | ICD-10-CM | POA: Diagnosis not present

## 2012-08-10 DIAGNOSIS — L408 Other psoriasis: Secondary | ICD-10-CM | POA: Diagnosis not present

## 2012-08-10 DIAGNOSIS — Z79899 Other long term (current) drug therapy: Secondary | ICD-10-CM | POA: Diagnosis not present

## 2012-08-16 DIAGNOSIS — H04129 Dry eye syndrome of unspecified lacrimal gland: Secondary | ICD-10-CM | POA: Diagnosis not present

## 2012-08-27 DIAGNOSIS — M5137 Other intervertebral disc degeneration, lumbosacral region: Secondary | ICD-10-CM | POA: Diagnosis not present

## 2012-08-27 DIAGNOSIS — M961 Postlaminectomy syndrome, not elsewhere classified: Secondary | ICD-10-CM | POA: Diagnosis not present

## 2012-09-07 DIAGNOSIS — L4050 Arthropathic psoriasis, unspecified: Secondary | ICD-10-CM | POA: Diagnosis not present

## 2012-09-08 DIAGNOSIS — L408 Other psoriasis: Secondary | ICD-10-CM | POA: Diagnosis not present

## 2012-09-08 DIAGNOSIS — M25569 Pain in unspecified knee: Secondary | ICD-10-CM | POA: Diagnosis not present

## 2012-09-08 DIAGNOSIS — L4050 Arthropathic psoriasis, unspecified: Secondary | ICD-10-CM | POA: Diagnosis not present

## 2012-09-23 DIAGNOSIS — Z1231 Encounter for screening mammogram for malignant neoplasm of breast: Secondary | ICD-10-CM | POA: Diagnosis not present

## 2012-10-05 DIAGNOSIS — M5137 Other intervertebral disc degeneration, lumbosacral region: Secondary | ICD-10-CM | POA: Diagnosis not present

## 2012-10-05 DIAGNOSIS — M47817 Spondylosis without myelopathy or radiculopathy, lumbosacral region: Secondary | ICD-10-CM | POA: Diagnosis not present

## 2012-10-11 DIAGNOSIS — L408 Other psoriasis: Secondary | ICD-10-CM | POA: Diagnosis not present

## 2012-10-11 DIAGNOSIS — M2550 Pain in unspecified joint: Secondary | ICD-10-CM | POA: Diagnosis not present

## 2012-10-11 DIAGNOSIS — Z79899 Other long term (current) drug therapy: Secondary | ICD-10-CM | POA: Diagnosis not present

## 2012-10-11 DIAGNOSIS — L4050 Arthropathic psoriasis, unspecified: Secondary | ICD-10-CM | POA: Diagnosis not present

## 2012-10-13 DIAGNOSIS — I10 Essential (primary) hypertension: Secondary | ICD-10-CM | POA: Diagnosis not present

## 2012-10-13 DIAGNOSIS — E039 Hypothyroidism, unspecified: Secondary | ICD-10-CM | POA: Diagnosis not present

## 2012-12-10 DIAGNOSIS — M545 Low back pain: Secondary | ICD-10-CM | POA: Diagnosis not present

## 2012-12-10 DIAGNOSIS — L4050 Arthropathic psoriasis, unspecified: Secondary | ICD-10-CM | POA: Diagnosis not present

## 2012-12-10 DIAGNOSIS — M2550 Pain in unspecified joint: Secondary | ICD-10-CM | POA: Diagnosis not present

## 2012-12-10 DIAGNOSIS — L408 Other psoriasis: Secondary | ICD-10-CM | POA: Diagnosis not present

## 2012-12-13 DIAGNOSIS — L408 Other psoriasis: Secondary | ICD-10-CM | POA: Diagnosis not present

## 2012-12-24 DIAGNOSIS — M545 Low back pain: Secondary | ICD-10-CM | POA: Diagnosis not present

## 2012-12-24 DIAGNOSIS — M79609 Pain in unspecified limb: Secondary | ICD-10-CM | POA: Diagnosis not present

## 2012-12-24 DIAGNOSIS — M5137 Other intervertebral disc degeneration, lumbosacral region: Secondary | ICD-10-CM | POA: Diagnosis not present

## 2012-12-24 DIAGNOSIS — M961 Postlaminectomy syndrome, not elsewhere classified: Secondary | ICD-10-CM | POA: Diagnosis not present

## 2013-01-18 DIAGNOSIS — M2550 Pain in unspecified joint: Secondary | ICD-10-CM | POA: Diagnosis not present

## 2013-02-19 ENCOUNTER — Encounter: Payer: Self-pay | Admitting: Interventional Cardiology

## 2013-02-19 DIAGNOSIS — M1990 Unspecified osteoarthritis, unspecified site: Secondary | ICD-10-CM | POA: Insufficient documentation

## 2013-02-19 DIAGNOSIS — M419 Scoliosis, unspecified: Secondary | ICD-10-CM | POA: Insufficient documentation

## 2013-02-19 DIAGNOSIS — I10 Essential (primary) hypertension: Secondary | ICD-10-CM | POA: Insufficient documentation

## 2013-02-19 DIAGNOSIS — K219 Gastro-esophageal reflux disease without esophagitis: Secondary | ICD-10-CM | POA: Insufficient documentation

## 2013-02-19 DIAGNOSIS — L409 Psoriasis, unspecified: Secondary | ICD-10-CM | POA: Insufficient documentation

## 2013-02-19 DIAGNOSIS — N200 Calculus of kidney: Secondary | ICD-10-CM | POA: Insufficient documentation

## 2013-02-19 DIAGNOSIS — F329 Major depressive disorder, single episode, unspecified: Secondary | ICD-10-CM | POA: Insufficient documentation

## 2013-02-19 DIAGNOSIS — M8580 Other specified disorders of bone density and structure, unspecified site: Secondary | ICD-10-CM | POA: Insufficient documentation

## 2013-02-19 DIAGNOSIS — E039 Hypothyroidism, unspecified: Secondary | ICD-10-CM | POA: Insufficient documentation

## 2013-02-19 DIAGNOSIS — E079 Disorder of thyroid, unspecified: Secondary | ICD-10-CM | POA: Insufficient documentation

## 2013-02-19 DIAGNOSIS — Z8719 Personal history of other diseases of the digestive system: Secondary | ICD-10-CM | POA: Insufficient documentation

## 2013-02-19 DIAGNOSIS — F32A Depression, unspecified: Secondary | ICD-10-CM | POA: Insufficient documentation

## 2013-02-19 DIAGNOSIS — E785 Hyperlipidemia, unspecified: Secondary | ICD-10-CM | POA: Insufficient documentation

## 2013-02-22 ENCOUNTER — Encounter: Payer: Self-pay | Admitting: Interventional Cardiology

## 2013-02-22 ENCOUNTER — Ambulatory Visit (INDEPENDENT_AMBULATORY_CARE_PROVIDER_SITE_OTHER): Payer: Medicare Other | Admitting: Interventional Cardiology

## 2013-02-22 VITALS — BP 104/80 | HR 60 | Ht 64.0 in | Wt 184.8 lb

## 2013-02-22 DIAGNOSIS — I10 Essential (primary) hypertension: Secondary | ICD-10-CM | POA: Diagnosis not present

## 2013-02-22 DIAGNOSIS — E785 Hyperlipidemia, unspecified: Secondary | ICD-10-CM | POA: Diagnosis not present

## 2013-02-22 NOTE — Patient Instructions (Addendum)
Your physician wants you to follow-up in: 1 year with Dr. Varanasi. You will receive a reminder letter in the mail two months in advance. If you don't receive a letter, please call our office to schedule the follow-up appointment.  Your physician recommends that you continue on your current medications as directed. Please refer to the Current Medication list given to you today.  

## 2013-02-22 NOTE — Progress Notes (Signed)
Patient ID: Becky Duran, female   DOB: 23-Oct-1932, 79 y.o.   MRN: [id number]    [address] [place], [place]  [address] Phone: [phone] Fax:  [phone]  Date:  02/22/2013   ID:  Becky Duran, DOB 06-Apr-1933, MRN [id number]  PCP:  Becky Blamer, MD      History of Present Illness: Becky Duran is a 79 y.o. female who has had difficult to control BP. Bystolic was changed to losartan. Nightmares have resolved and no spikes in her BP. BP readings ar enow low at times. Walking limited by back pain. Only walking in the grocery store with a cart. She received steroid injections as well at one point but they seemed to stop working. Now taking Percocet for pain from Oxycontin over the past 8 months.  She had headaches about 6 months ago associated with headaches.  She started taking some BP meds before going to bed at night. In the past few months, BP has been better controlled. She has some fatigue when BP low.  No recent high readings.     Wt Readings from Last 3 Encounters:  02/22/13 184 lb 12.8 oz (83.825 kg)  06/04/11 180 lb 4.8 oz (81.784 kg)  06/04/11 180 lb 4.8 oz (81.784 kg)     Past Medical History  Diagnosis Date  . Depression   . GERD (gastroesophageal reflux disease)   . Thyroid disease     hypothyroidism  . Hyperlipidemia   . Endometriosis   . Osteoarthritis   . Osteopenia   . Scoliosis   . Kidney stones   . Hypothyroidism   . Hypertension   . H/O hiatal hernia     small Hital Hernia  . Psoriasis     Chest, Back and face  . Constipation   . Heel fracture   . Psoriasis     dx'd @10yrs . ago.    Current Outpatient Prescriptions  Medication Sig Dispense Refill  . amLODipine (NORVASC) 5 MG tablet Take 5 mg by mouth daily.        Marland Kitchen aspirin 81 MG tablet Take 81 mg by mouth daily.        Marland Kitchen atorvastatin (LIPITOR) 20 MG tablet Take 20 mg by mouth daily.        Marland Kitchen FLUoxetine (PROZAC) 10 MG tablet Take 10 mg by mouth daily.        Marland Kitchen  levothyroxine (SYNTHROID, LEVOTHROID) 75 MCG tablet Take 75 mcg by mouth daily.        Marland Kitchen losartan (COZAAR) 50 MG tablet Take 50 mg by mouth daily.       No current facility-administered medications for this visit.    Allergies:    Allergies  Allergen Reactions  . Ciprofloxacin     Metallic taste  . Diclofenac Sodium     Afraid bp would spike  . Etodolac     Spiked blood pressure   . Penicillins Hives  . Relafen [Nabumetone]   . Sulfa Drugs Cross Reactors     diarrhea  . Valacyclovir Hcl     Possible confusion  . Lisinopril Cough    Social History:  The patient  reports that she has never smoked. She does not have any smokeless tobacco history on file. She reports that she does not drink alcohol or use illicit drugs.   Family History:  The patient's family history includes Anesthesia problems in her mother.   ROS:  Please see the history of present  illness.  No nausea, vomiting.  No fevers, chills.  No focal weakness.  No dysuria.   All other systems reviewed and negative.   PHYSICAL EXAM: VS:  BP 104/80  Pulse 60  Ht 5\' 4"  (1.626 m)  Wt 184 lb 12.8 oz (83.825 kg)  BMI 31.71 kg/m2 Well nourished, well developed, in no acute distress HEENT: normal Neck: no JVD, no carotid bruits Cardiac:  normal S1, S2; RRR;  Lungs:  clear to auscultation bilaterally, no wheezing, rhonchi or rales Abd: soft, nontender, no hepatomegaly Ext: no edema Skin: warm and dry Neuro:   no focal abnormalities noted  EKG:  Normal    ASSESSMENT AND PLAN:  HTN  Continue Amlodipine Besylate Tablet, 5 MG, one tablet, Orally, Once a day, 30 day(s), 30, Refills 11 Continue Losartan Potassium Tablet, 50 MG, 1 tablet, Orally, Once a day Diagnostic Imaging:EKG Harward,Amy 02/24/2012 11:26:37 AM > Macklin Jacquin,JAY 02/24/2012 11:42:59 AM > NSR, PRWP, no significant ST segment changes  She has been intolerant of several meds. She is tolerating Norvasc. Inrease fiber for constipation likely related to meds  including pain meds. BP increased when Norvasc went to 2.5 mg daily.  2. Hyperlipemia, mixed  Continue Lipitor Tablet, 20 MG, 1 tablet, Orally, Once a day LDL < 100. Well controlled.  LDL 76 in November 2013   Signed, Fredric Mare, MD, [facility] 02/22/2013 11:49 AM

## 2013-03-03 DIAGNOSIS — M545 Low back pain: Secondary | ICD-10-CM | POA: Diagnosis not present

## 2013-03-15 DIAGNOSIS — L408 Other psoriasis: Secondary | ICD-10-CM | POA: Diagnosis not present

## 2013-03-15 DIAGNOSIS — M2550 Pain in unspecified joint: Secondary | ICD-10-CM | POA: Diagnosis not present

## 2013-03-15 DIAGNOSIS — Z79899 Other long term (current) drug therapy: Secondary | ICD-10-CM | POA: Diagnosis not present

## 2013-03-15 DIAGNOSIS — L4050 Arthropathic psoriasis, unspecified: Secondary | ICD-10-CM | POA: Diagnosis not present

## 2013-04-08 DIAGNOSIS — M961 Postlaminectomy syndrome, not elsewhere classified: Secondary | ICD-10-CM | POA: Diagnosis not present

## 2013-04-08 DIAGNOSIS — M5137 Other intervertebral disc degeneration, lumbosacral region: Secondary | ICD-10-CM | POA: Diagnosis not present

## 2013-04-08 DIAGNOSIS — M47817 Spondylosis without myelopathy or radiculopathy, lumbosacral region: Secondary | ICD-10-CM | POA: Diagnosis not present

## 2013-04-12 DIAGNOSIS — L4050 Arthropathic psoriasis, unspecified: Secondary | ICD-10-CM | POA: Diagnosis not present

## 2013-04-18 DIAGNOSIS — M5137 Other intervertebral disc degeneration, lumbosacral region: Secondary | ICD-10-CM | POA: Diagnosis not present

## 2013-04-19 DIAGNOSIS — M5137 Other intervertebral disc degeneration, lumbosacral region: Secondary | ICD-10-CM | POA: Diagnosis not present

## 2013-05-11 DIAGNOSIS — M5137 Other intervertebral disc degeneration, lumbosacral region: Secondary | ICD-10-CM | POA: Diagnosis not present

## 2013-05-24 DIAGNOSIS — M2550 Pain in unspecified joint: Secondary | ICD-10-CM | POA: Diagnosis not present

## 2013-05-24 DIAGNOSIS — L408 Other psoriasis: Secondary | ICD-10-CM | POA: Diagnosis not present

## 2013-05-24 DIAGNOSIS — M1990 Unspecified osteoarthritis, unspecified site: Secondary | ICD-10-CM | POA: Diagnosis not present

## 2013-05-24 DIAGNOSIS — L4050 Arthropathic psoriasis, unspecified: Secondary | ICD-10-CM | POA: Diagnosis not present

## 2013-05-28 DIAGNOSIS — K5900 Constipation, unspecified: Secondary | ICD-10-CM | POA: Diagnosis not present

## 2013-06-01 DIAGNOSIS — I10 Essential (primary) hypertension: Secondary | ICD-10-CM | POA: Diagnosis not present

## 2013-06-01 DIAGNOSIS — E785 Hyperlipidemia, unspecified: Secondary | ICD-10-CM | POA: Diagnosis not present

## 2013-06-01 DIAGNOSIS — E039 Hypothyroidism, unspecified: Secondary | ICD-10-CM | POA: Diagnosis not present

## 2013-06-01 DIAGNOSIS — F3289 Other specified depressive episodes: Secondary | ICD-10-CM | POA: Diagnosis not present

## 2013-06-01 DIAGNOSIS — F329 Major depressive disorder, single episode, unspecified: Secondary | ICD-10-CM | POA: Diagnosis not present

## 2013-06-02 DIAGNOSIS — L4050 Arthropathic psoriasis, unspecified: Secondary | ICD-10-CM | POA: Diagnosis not present

## 2013-06-16 DIAGNOSIS — L4050 Arthropathic psoriasis, unspecified: Secondary | ICD-10-CM | POA: Diagnosis not present

## 2013-06-19 DIAGNOSIS — A088 Other specified intestinal infections: Secondary | ICD-10-CM | POA: Diagnosis not present

## 2013-06-19 DIAGNOSIS — R1110 Vomiting, unspecified: Secondary | ICD-10-CM | POA: Diagnosis not present

## 2013-07-14 DIAGNOSIS — L4050 Arthropathic psoriasis, unspecified: Secondary | ICD-10-CM | POA: Diagnosis not present

## 2013-07-26 DIAGNOSIS — M1990 Unspecified osteoarthritis, unspecified site: Secondary | ICD-10-CM | POA: Diagnosis not present

## 2013-07-26 DIAGNOSIS — M2550 Pain in unspecified joint: Secondary | ICD-10-CM | POA: Diagnosis not present

## 2013-07-26 DIAGNOSIS — L408 Other psoriasis: Secondary | ICD-10-CM | POA: Diagnosis not present

## 2013-07-26 DIAGNOSIS — L4050 Arthropathic psoriasis, unspecified: Secondary | ICD-10-CM | POA: Diagnosis not present

## 2013-07-28 ENCOUNTER — Other Ambulatory Visit: Payer: Self-pay | Admitting: Physician Assistant

## 2013-07-28 DIAGNOSIS — L259 Unspecified contact dermatitis, unspecified cause: Secondary | ICD-10-CM | POA: Diagnosis not present

## 2013-07-28 DIAGNOSIS — L570 Actinic keratosis: Secondary | ICD-10-CM | POA: Diagnosis not present

## 2013-08-23 DIAGNOSIS — L4050 Arthropathic psoriasis, unspecified: Secondary | ICD-10-CM | POA: Diagnosis not present

## 2013-08-23 DIAGNOSIS — M2550 Pain in unspecified joint: Secondary | ICD-10-CM | POA: Diagnosis not present

## 2013-08-23 DIAGNOSIS — L408 Other psoriasis: Secondary | ICD-10-CM | POA: Diagnosis not present

## 2013-08-23 DIAGNOSIS — M5137 Other intervertebral disc degeneration, lumbosacral region: Secondary | ICD-10-CM | POA: Diagnosis not present

## 2013-08-23 DIAGNOSIS — Z79899 Other long term (current) drug therapy: Secondary | ICD-10-CM | POA: Diagnosis not present

## 2013-08-25 DIAGNOSIS — L259 Unspecified contact dermatitis, unspecified cause: Secondary | ICD-10-CM | POA: Diagnosis not present

## 2013-08-25 DIAGNOSIS — D485 Neoplasm of uncertain behavior of skin: Secondary | ICD-10-CM | POA: Diagnosis not present

## 2013-08-31 DIAGNOSIS — L4050 Arthropathic psoriasis, unspecified: Secondary | ICD-10-CM | POA: Diagnosis not present

## 2013-09-14 DIAGNOSIS — L4050 Arthropathic psoriasis, unspecified: Secondary | ICD-10-CM | POA: Diagnosis not present

## 2013-09-19 DIAGNOSIS — L408 Other psoriasis: Secondary | ICD-10-CM | POA: Diagnosis not present

## 2013-09-19 DIAGNOSIS — M1990 Unspecified osteoarthritis, unspecified site: Secondary | ICD-10-CM | POA: Diagnosis not present

## 2013-09-19 DIAGNOSIS — M2550 Pain in unspecified joint: Secondary | ICD-10-CM | POA: Diagnosis not present

## 2013-09-19 DIAGNOSIS — L4050 Arthropathic psoriasis, unspecified: Secondary | ICD-10-CM | POA: Diagnosis not present

## 2013-10-04 ENCOUNTER — Other Ambulatory Visit: Payer: Self-pay | Admitting: Physician Assistant

## 2013-10-04 DIAGNOSIS — C44319 Basal cell carcinoma of skin of other parts of face: Secondary | ICD-10-CM | POA: Diagnosis not present

## 2013-10-11 DIAGNOSIS — H26499 Other secondary cataract, unspecified eye: Secondary | ICD-10-CM | POA: Diagnosis not present

## 2013-10-11 DIAGNOSIS — Z961 Presence of intraocular lens: Secondary | ICD-10-CM | POA: Diagnosis not present

## 2013-10-12 DIAGNOSIS — L4050 Arthropathic psoriasis, unspecified: Secondary | ICD-10-CM | POA: Diagnosis not present

## 2013-10-17 DIAGNOSIS — Z1231 Encounter for screening mammogram for malignant neoplasm of breast: Secondary | ICD-10-CM | POA: Diagnosis not present

## 2013-10-19 DIAGNOSIS — M2550 Pain in unspecified joint: Secondary | ICD-10-CM | POA: Diagnosis not present

## 2013-10-26 DIAGNOSIS — L4050 Arthropathic psoriasis, unspecified: Secondary | ICD-10-CM | POA: Diagnosis not present

## 2013-10-31 DIAGNOSIS — R351 Nocturia: Secondary | ICD-10-CM | POA: Diagnosis not present

## 2013-10-31 DIAGNOSIS — R350 Frequency of micturition: Secondary | ICD-10-CM | POA: Diagnosis not present

## 2013-11-04 DIAGNOSIS — M5137 Other intervertebral disc degeneration, lumbosacral region: Secondary | ICD-10-CM | POA: Diagnosis not present

## 2013-11-04 DIAGNOSIS — M961 Postlaminectomy syndrome, not elsewhere classified: Secondary | ICD-10-CM | POA: Diagnosis not present

## 2013-11-07 DIAGNOSIS — M25519 Pain in unspecified shoulder: Secondary | ICD-10-CM | POA: Diagnosis not present

## 2013-11-07 DIAGNOSIS — M25569 Pain in unspecified knee: Secondary | ICD-10-CM | POA: Diagnosis not present

## 2013-11-14 DIAGNOSIS — Z85828 Personal history of other malignant neoplasm of skin: Secondary | ICD-10-CM | POA: Diagnosis not present

## 2013-11-15 DIAGNOSIS — M545 Low back pain, unspecified: Secondary | ICD-10-CM | POA: Diagnosis not present

## 2013-11-15 DIAGNOSIS — M25569 Pain in unspecified knee: Secondary | ICD-10-CM | POA: Diagnosis not present

## 2013-11-15 DIAGNOSIS — M5137 Other intervertebral disc degeneration, lumbosacral region: Secondary | ICD-10-CM | POA: Diagnosis not present

## 2013-11-26 ENCOUNTER — Encounter (HOSPITAL_COMMUNITY): Payer: Self-pay | Admitting: Emergency Medicine

## 2013-11-26 ENCOUNTER — Emergency Department (HOSPITAL_COMMUNITY)
Admission: EM | Admit: 2013-11-26 | Discharge: 2013-11-26 | Disposition: A | Discharge status: Home / Self Care | Payer: Medicare Other | Attending: Emergency Medicine | Admitting: Emergency Medicine

## 2013-11-26 ENCOUNTER — Emergency Department (HOSPITAL_COMMUNITY): Payer: Medicare Other

## 2013-11-26 DIAGNOSIS — Z791 Long term (current) use of non-steroidal anti-inflammatories (NSAID): Secondary | ICD-10-CM | POA: Diagnosis not present

## 2013-11-26 DIAGNOSIS — E785 Hyperlipidemia, unspecified: Secondary | ICD-10-CM | POA: Diagnosis not present

## 2013-11-26 DIAGNOSIS — Z872 Personal history of diseases of the skin and subcutaneous tissue: Secondary | ICD-10-CM | POA: Insufficient documentation

## 2013-11-26 DIAGNOSIS — Z8739 Personal history of other diseases of the musculoskeletal system and connective tissue: Secondary | ICD-10-CM | POA: Insufficient documentation

## 2013-11-26 DIAGNOSIS — E079 Disorder of thyroid, unspecified: Secondary | ICD-10-CM | POA: Insufficient documentation

## 2013-11-26 DIAGNOSIS — Z79899 Other long term (current) drug therapy: Secondary | ICD-10-CM | POA: Diagnosis not present

## 2013-11-26 DIAGNOSIS — R61 Generalized hyperhidrosis: Secondary | ICD-10-CM | POA: Diagnosis not present

## 2013-11-26 DIAGNOSIS — Z8781 Personal history of (healed) traumatic fracture: Secondary | ICD-10-CM | POA: Insufficient documentation

## 2013-11-26 DIAGNOSIS — Z87442 Personal history of urinary calculi: Secondary | ICD-10-CM | POA: Insufficient documentation

## 2013-11-26 DIAGNOSIS — R1110 Vomiting, unspecified: Secondary | ICD-10-CM | POA: Insufficient documentation

## 2013-11-26 DIAGNOSIS — R51 Headache: Secondary | ICD-10-CM | POA: Diagnosis not present

## 2013-11-26 DIAGNOSIS — Z8659 Personal history of other mental and behavioral disorders: Secondary | ICD-10-CM | POA: Insufficient documentation

## 2013-11-26 DIAGNOSIS — Z880 Allergy status to penicillin: Secondary | ICD-10-CM | POA: Diagnosis not present

## 2013-11-26 DIAGNOSIS — Z87448 Personal history of other diseases of urinary system: Secondary | ICD-10-CM | POA: Diagnosis not present

## 2013-11-26 DIAGNOSIS — Z7982 Long term (current) use of aspirin: Secondary | ICD-10-CM | POA: Insufficient documentation

## 2013-11-26 DIAGNOSIS — H9209 Otalgia, unspecified ear: Secondary | ICD-10-CM | POA: Insufficient documentation

## 2013-11-26 DIAGNOSIS — I10 Essential (primary) hypertension: Secondary | ICD-10-CM | POA: Insufficient documentation

## 2013-11-26 DIAGNOSIS — R519 Headache, unspecified: Secondary | ICD-10-CM

## 2013-11-26 DIAGNOSIS — Z8719 Personal history of other diseases of the digestive system: Secondary | ICD-10-CM | POA: Insufficient documentation

## 2013-11-26 DIAGNOSIS — R52 Pain, unspecified: Secondary | ICD-10-CM | POA: Diagnosis not present

## 2013-11-26 LAB — CBC WITH DIFFERENTIAL/PLATELET
BASOS ABS: 0 10*3/uL (ref 0.0–0.1)
Basophils Relative: 0 % (ref 0–1)
EOS ABS: 0.2 10*3/uL (ref 0.0–0.7)
EOS PCT: 3 % (ref 0–5)
HEMATOCRIT: 37.7 % (ref 36.0–46.0)
Hemoglobin: 12.2 g/dL (ref 12.0–15.0)
Lymphocytes Relative: 21 % (ref 12–46)
Lymphs Abs: 1.2 10*3/uL (ref 0.7–4.0)
MCH: 30.5 pg (ref 26.0–34.0)
MCHC: 32.4 g/dL (ref 30.0–36.0)
MCV: 94.3 fL (ref 78.0–100.0)
MONO ABS: 0.6 10*3/uL (ref 0.1–1.0)
Monocytes Relative: 10 % (ref 3–12)
Neutro Abs: 3.7 10*3/uL (ref 1.7–7.7)
Neutrophils Relative %: 66 % (ref 43–77)
PLATELETS: 149 10*3/uL — AB (ref 150–400)
RBC: 4 MIL/uL (ref 3.87–5.11)
RDW: 14.3 % (ref 11.5–15.5)
WBC: 5.6 10*3/uL (ref 4.0–10.5)

## 2013-11-26 LAB — BASIC METABOLIC PANEL
Anion gap: 12 (ref 5–15)
BUN: 12 mg/dL (ref 6–23)
CALCIUM: 8.9 mg/dL (ref 8.4–10.5)
CO2: 24 mEq/L (ref 19–32)
CREATININE: 0.87 mg/dL (ref 0.50–1.10)
Chloride: 96 mEq/L (ref 96–112)
GFR calc Af Amer: 72 mL/min — ABNORMAL LOW (ref >90)
GFR, EST NON AFRICAN AMERICAN: 62 mL/min — AB (ref >90)
GLUCOSE: 100 mg/dL — AB (ref 70–99)
Potassium: 4.1 mEq/L (ref 3.7–5.3)
SODIUM: 132 meq/L — AB (ref 137–147)

## 2013-11-26 MED ORDER — BUTALBITAL-APAP-CAFFEINE 50-325-40 MG PO TABS
1.0000 | ORAL_TABLET | Freq: Four times a day (QID) | ORAL | Status: DC | PRN
Start: 2013-11-26 — End: 2013-11-28

## 2013-11-26 MED ORDER — SODIUM CHLORIDE 0.9 % IV SOLN
INTRAVENOUS | Status: DC
  Administered 2013-11-26: 14:00:00 via INTRAVENOUS

## 2013-11-26 MED ORDER — HYDROMORPHONE HCL PF 1 MG/ML IJ SOLN
1.0000 mg | Freq: Once | INTRAMUSCULAR | Status: AC
  Administered 2013-11-26: 1 mg via INTRAVENOUS
  Filled 2013-11-26: qty 1

## 2013-11-26 MED ORDER — ONDANSETRON HCL 4 MG/2ML IJ SOLN: 4.0000 mg | Freq: Once | INTRAMUSCULAR | Status: DC | PRN

## 2013-11-26 NOTE — Discharge Instructions (Signed)

## 2013-11-26 NOTE — ED Provider Notes (Signed)
CSN: [id number]     Arrival date & time 11/26/13  1208 History   First MD Initiated Contact with Patient 11/26/13 1252     Chief Complaint  Patient presents with  . Headache   HPI Comments: She woke up with a throbbing headache on Tuesday.  It would wax and wane.  She called her doctor and has an appointment scheduled for next week.  Patient is a 80 y.o. female presenting with headaches. The history is provided by the patient.  Headache Pain location:  L temporal and L parietal Quality:  Dull Radiates to:  Does not radiate Duration:  5 days Relieved by:  Nothing Worsened by:  Nothing tried Ineffective treatments:  Prescription medications and acetaminophen (oxycontin) Associated symptoms: ear pain and vomiting (a couple of times)   Associated symptoms: no blurred vision, no cough, no pain, no fever, no focal weakness and no seizures     Past Medical History  Diagnosis Date  . Depression   . GERD (gastroesophageal reflux disease)   . Thyroid disease     hypothyroidism  . Hyperlipidemia   . Endometriosis   . Osteoarthritis   . Osteopenia   . Scoliosis   . Kidney stones   . Hypothyroidism   . Hypertension   . H/O hiatal hernia     small Hital Hernia  . Psoriasis     Chest, Back and face  . Constipation   . Heel fracture   . Psoriasis     dx'd @10yrs . ago.   Past Surgical History  Procedure Laterality Date  . Lipoma excision  2012     Back  . Abdominal hysterectomy    . Cardiac catheterization  2011  . Eye surgery  4 years ago    cataract with lens- bil  . Breast lumpectomy      20 years ago- bengin  . Nephrolithotomy      for kidney stone- > 2 years.  . Lumbar laminectomy/decompression microdiscectomy  06/04/2011    Procedure: LUMBAR LAMINECTOMY/DECOMPRESSION MICRODISCECTOMY;  Surgeon: Floyce Stakes, MD;  Location: [place] [facility];  Service: Neurosurgery;  Laterality: N/A;  Lumbar Four-Five partial Lumbar Three Laminectomy   Family History  Problem Relation Age  of Onset  . Anesthesia problems Mother    History  Substance Use Topics  . Smoking status: Never Smoker   . Smokeless tobacco: Not on file  . Alcohol Use: No   OB History   Grav Para Term Preterm Abortions TAB SAB Ect Mult Living                 Review of Systems  Constitutional: Positive for diaphoresis. Negative for fever.  HENT: Positive for ear pain.   Eyes: Negative for blurred vision and pain.  Respiratory: Negative for cough.   Gastrointestinal: Positive for vomiting (a couple of times).  Endocrine:       Dry mouth   Neurological: Positive for headaches. Negative for focal weakness and seizures.      Allergies  Ciprofloxacin; Diclofenac sodium; Etodolac; Penicillins; Relafen; Sulfa drugs cross reactors; Valacyclovir hcl; and Lisinopril  Home Medications   Prior to Admission medications   Medication Sig Start Date End Date Taking? Authorizing Provider  acetaminophen (TYLENOL) 500 MG tablet Take 1,000 mg by mouth 2 (two) times daily as needed for moderate pain (arthiritis).   Yes Historical Provider, MD  amLODipine (NORVASC) 5 MG tablet Take 5 mg by mouth daily.     Yes Historical Provider, MD  aspirin  81 MG tablet Take 81 mg by mouth daily.     Yes Historical Provider, MD  atorvastatin (LIPITOR) 20 MG tablet Take 20 mg by mouth daily.     Yes Historical Provider, MD  diclofenac sodium (VOLTAREN) 1 % GEL Apply 2 g topically 4 (four) times daily. Knees.   Yes Historical Provider, MD  FLUoxetine (PROZAC) 10 MG tablet Take 10 mg by mouth daily.     Yes Historical Provider, MD  levothyroxine (SYNTHROID, LEVOTHROID) 75 MCG tablet Take 75 mcg by mouth daily.     Yes Historical Provider, MD  losartan (COZAAR) 50 MG tablet Take 50 mg by mouth daily.   Yes Historical Provider, MD  Menthol, Topical Analgesic, (BLUE-EMU MAXIMUM STRENGTH EX) Apply 1 application topically 4 (four) times daily as needed (arthiritis).   Yes Historical Provider, MD  butalbital-acetaminophen-caffeine  (FIORICET) 50-325-40 MG per tablet Take 1-2 tablets by mouth every 6 (six) hours as needed for headache (max 6 tabs per day). 11/26/13 11/26/14  Dorie Rank, MD   BP 140/73  Pulse 92  Temp(Src) 99.9 F (37.7 C) (Oral)  Resp 18  SpO2 91% Physical Exam  Nursing note and vitals reviewed. Constitutional: She appears well-developed and well-nourished. No distress.  HENT:  Head: Normocephalic and atraumatic.  Right Ear: External ear normal.  Left Ear: External ear normal.  No ttp temporal artery   Eyes: Conjunctivae are normal. Right eye exhibits no discharge. Left eye exhibits no discharge. No scleral icterus.  Neck: Normal range of motion. Neck supple. No rigidity. No tracheal deviation present.  Cardiovascular: Normal rate, regular rhythm and intact distal pulses.   Pulmonary/Chest: Effort normal and breath sounds normal. No stridor. No respiratory distress. She has no wheezes. She has no rales.  Abdominal: Soft. Bowel sounds are normal. She exhibits no distension. There is no tenderness. There is no rebound and no guarding.  Musculoskeletal: She exhibits no edema and no tenderness.  Neurological: She is alert. She has normal strength. No cranial nerve deficit (no facial droop, extraocular movements intact, no slurred speech) or sensory deficit. She exhibits normal muscle tone. She displays no seizure activity. Coordination normal.  Skin: Skin is warm and dry. No rash noted.  Psychiatric: She has a normal mood and affect.    ED Course  Procedures (including critical care time) Labs Review Labs Reviewed  BASIC METABOLIC PANEL - Abnormal; Notable for the following:    Sodium 132 (*)    Glucose, Bld 100 (*)    GFR calc non Af Amer 62 (*)    GFR calc Af Amer 72 (*)    All other components within normal limits  CBC WITH DIFFERENTIAL - Abnormal; Notable for the following:    Platelets 149 (*)    All other components within normal limits  CBC WITH DIFFERENTIAL    Imaging Review Ct Head  Wo Contrast  11/26/2013   CLINICAL DATA:  80 year old female with left-sided headache and nausea.  EXAM: CT HEAD WITHOUT CONTRAST  TECHNIQUE: Contiguous axial images were obtained from the base of the skull through the vertex without intravenous contrast.  COMPARISON:  01/03/2010 CT and MR  FINDINGS: Very mild chronic small-vessel white matter ischemic changes and generalized cerebral volume loss again noted.  No acute intracranial abnormalities are identified, including mass lesion or mass effect, hydrocephalus, extra-axial fluid collection, midline shift, hemorrhage, or acute infarction.  The visualized bony calvarium is unremarkable.  IMPRESSION: No evidence of acute intracranial abnormality.  Very mild chronic small-vessel white matter  ischemic changes and atrophy.   Electronically Signed   By: Hassan Rowan M.D.   On: 11/26/2013 14:07   1555  Feeling much better after treatment.  HA resolved.  MDM   Final diagnoses:  Acute nonintractable headache, unspecified headache type    Doubt meningitis or infectious etiology.  No thunder clap onset, doubt SAH, hemorrhage aneurysm.  DC home with pain meds.  Had been using her oxycodone that she takes for her back without relief.  Will try fioricet    Dorie Rank, MD 11/26/13 1556

## 2013-11-26 NOTE — ED Notes (Signed)
Per pt, states H/A on and off for a week-increased pain this am-starts in back of head and moves towards left side to front-was using experimental antiinflammatory cream for knees and that's when it started-stopped taking but headache still persistent

## 2013-11-26 NOTE — ED Notes (Addendum)
Pt has had a headache off and on x 1 week. She reports that it started getting worse yesterday so she called and got an appt for Tuesday with her PCP. The headache starts in the back of her head near the base of the skull that radiates up the left side of her head. She reports a few episodes of nausea but denies as of now. Hx of arthritis and  Chronic back pain. 9/10 sharp constant headache. No neuro deficits.

## 2013-11-28 ENCOUNTER — Emergency Department (HOSPITAL_COMMUNITY): Payer: Medicare Other

## 2013-11-28 ENCOUNTER — Encounter (HOSPITAL_COMMUNITY): Payer: Self-pay | Admitting: Emergency Medicine

## 2013-11-28 ENCOUNTER — Emergency Department (HOSPITAL_COMMUNITY)
Admission: EM | Admit: 2013-11-28 | Discharge: 2013-11-28 | Disposition: A | Discharge status: Home / Self Care | Payer: Medicare Other | Attending: Emergency Medicine | Admitting: Emergency Medicine

## 2013-11-28 DIAGNOSIS — Z8742 Personal history of other diseases of the female genital tract: Secondary | ICD-10-CM | POA: Insufficient documentation

## 2013-11-28 DIAGNOSIS — E785 Hyperlipidemia, unspecified: Secondary | ICD-10-CM | POA: Insufficient documentation

## 2013-11-28 DIAGNOSIS — Z79899 Other long term (current) drug therapy: Secondary | ICD-10-CM | POA: Insufficient documentation

## 2013-11-28 DIAGNOSIS — R110 Nausea: Secondary | ICD-10-CM

## 2013-11-28 DIAGNOSIS — J9819 Other pulmonary collapse: Secondary | ICD-10-CM | POA: Diagnosis not present

## 2013-11-28 DIAGNOSIS — Z872 Personal history of diseases of the skin and subcutaneous tissue: Secondary | ICD-10-CM | POA: Insufficient documentation

## 2013-11-28 DIAGNOSIS — M1990 Unspecified osteoarthritis, unspecified site: Secondary | ICD-10-CM | POA: Diagnosis not present

## 2013-11-28 DIAGNOSIS — E860 Dehydration: Secondary | ICD-10-CM | POA: Insufficient documentation

## 2013-11-28 DIAGNOSIS — Z8719 Personal history of other diseases of the digestive system: Secondary | ICD-10-CM | POA: Diagnosis not present

## 2013-11-28 DIAGNOSIS — I10 Essential (primary) hypertension: Secondary | ICD-10-CM | POA: Diagnosis not present

## 2013-11-28 DIAGNOSIS — F329 Major depressive disorder, single episode, unspecified: Secondary | ICD-10-CM | POA: Insufficient documentation

## 2013-11-28 DIAGNOSIS — Z880 Allergy status to penicillin: Secondary | ICD-10-CM | POA: Diagnosis not present

## 2013-11-28 DIAGNOSIS — Z87442 Personal history of urinary calculi: Secondary | ICD-10-CM | POA: Diagnosis not present

## 2013-11-28 DIAGNOSIS — Z9889 Other specified postprocedural states: Secondary | ICD-10-CM | POA: Diagnosis not present

## 2013-11-28 DIAGNOSIS — Z7982 Long term (current) use of aspirin: Secondary | ICD-10-CM | POA: Insufficient documentation

## 2013-11-28 DIAGNOSIS — E039 Hypothyroidism, unspecified: Secondary | ICD-10-CM | POA: Insufficient documentation

## 2013-11-28 DIAGNOSIS — F3289 Other specified depressive episodes: Secondary | ICD-10-CM | POA: Diagnosis not present

## 2013-11-28 LAB — CBC WITH DIFFERENTIAL/PLATELET
BASOS ABS: 0 10*3/uL (ref 0.0–0.1)
BASOS PCT: 1 % (ref 0–1)
EOS PCT: 5 % (ref 0–5)
Eosinophils Absolute: 0.2 10*3/uL (ref 0.0–0.7)
HCT: 36.3 % (ref 36.0–46.0)
Hemoglobin: 11.8 g/dL — ABNORMAL LOW (ref 12.0–15.0)
LYMPHS PCT: 21 % (ref 12–46)
Lymphs Abs: 1.1 10*3/uL (ref 0.7–4.0)
MCH: 30.2 pg (ref 26.0–34.0)
MCHC: 32.5 g/dL (ref 30.0–36.0)
MCV: 92.8 fL (ref 78.0–100.0)
MONO ABS: 0.5 10*3/uL (ref 0.1–1.0)
Monocytes Relative: 10 % (ref 3–12)
NEUTROS ABS: 3.3 10*3/uL (ref 1.7–7.7)
Neutrophils Relative %: 63 % (ref 43–77)
PLATELETS: 172 10*3/uL (ref 150–400)
RBC: 3.91 MIL/uL (ref 3.87–5.11)
RDW: 14.2 % (ref 11.5–15.5)
WBC: 5.2 10*3/uL (ref 4.0–10.5)

## 2013-11-28 LAB — COMPREHENSIVE METABOLIC PANEL
ALBUMIN: 2.9 g/dL — AB (ref 3.5–5.2)
ALT: 39 U/L — AB (ref 0–35)
AST: 30 U/L (ref 0–37)
Alkaline Phosphatase: 67 U/L (ref 39–117)
Anion gap: 13 (ref 5–15)
BUN: 15 mg/dL (ref 6–23)
CALCIUM: 8.8 mg/dL (ref 8.4–10.5)
CHLORIDE: 100 meq/L (ref 96–112)
CO2: 25 meq/L (ref 19–32)
CREATININE: 0.89 mg/dL (ref 0.50–1.10)
GFR calc Af Amer: 70 mL/min — ABNORMAL LOW (ref >90)
GFR, EST NON AFRICAN AMERICAN: 60 mL/min — AB (ref >90)
Glucose, Bld: 91 mg/dL (ref 70–99)
Potassium: 4.2 mEq/L (ref 3.7–5.3)
SODIUM: 138 meq/L (ref 137–147)
Total Bilirubin: 0.5 mg/dL (ref 0.3–1.2)
Total Protein: 6.9 g/dL (ref 6.0–8.3)

## 2013-11-28 LAB — TROPONIN I

## 2013-11-28 MED ORDER — IBUPROFEN 200 MG PO TABS
600.0000 mg | ORAL_TABLET | Freq: Once | ORAL | Status: AC
  Administered 2013-11-28: 600 mg via ORAL
  Filled 2013-11-28: qty 3

## 2013-11-28 MED ORDER — SODIUM CHLORIDE 0.9 % IV SOLN
1000.0000 mL | Freq: Once | INTRAVENOUS | Status: AC
  Administered 2013-11-28: 1000 mL via INTRAVENOUS

## 2013-11-28 MED ORDER — SODIUM CHLORIDE 0.9 % IV SOLN
1000.0000 mL | INTRAVENOUS | Status: DC
  Administered 2013-11-28: 1000 mL via INTRAVENOUS

## 2013-11-28 MED ORDER — ONDANSETRON 8 MG PO TBDP: 8.0000 mg | ORAL_TABLET | Freq: Three times a day (TID) | ORAL | Status: DC | PRN

## 2013-11-28 MED ORDER — ONDANSETRON HCL 4 MG/2ML IJ SOLN
4.0000 mg | Freq: Once | INTRAMUSCULAR | Status: AC
  Administered 2013-11-28: 4 mg via INTRAVENOUS
  Filled 2013-11-28: qty 2

## 2013-11-28 NOTE — ED Notes (Signed)
Pt sts nausea has resolved, however she "still feel lousy". Pt denies pain ut sts she is very weak.

## 2013-11-28 NOTE — Discharge Instructions (Signed)
Dehydration, Adult Dehydration is when you lose more fluids from the body than you take in. Vital organs like the kidneys, brain, and heart cannot function without a proper amount of fluids and salt. Any loss of fluids from the body can cause dehydration.  CAUSES   Vomiting.  Diarrhea.  Excessive sweating.  Excessive urine output.  Fever. SYMPTOMS  Mild dehydration  Thirst.  Dry lips.  Slightly dry mouth. Moderate dehydration  Very dry mouth.  Sunken eyes.  Skin does not bounce back quickly when lightly pinched and released.  Dark urine and decreased urine production.  Decreased tear production.  Headache. Severe dehydration  Very dry mouth.  Extreme thirst.  Rapid, weak pulse (more than 100 beats per minute at rest).  Cold hands and feet.  Not able to sweat in spite of heat and temperature.  Rapid breathing.  Blue lips.  Confusion and lethargy.  Difficulty being awakened.  Minimal urine production.  No tears. DIAGNOSIS  Your caregiver will diagnose dehydration based on your symptoms and your exam. Blood and urine tests will help confirm the diagnosis. The diagnostic evaluation should also identify the cause of dehydration. TREATMENT  Treatment of mild or moderate dehydration can often be done at home by increasing the amount of fluids that you drink. It is best to drink small amounts of fluid more often. Drinking too much at one time can make vomiting worse. Refer to the home care instructions below. Severe dehydration needs to be treated at the hospital where you will probably be given intravenous (IV) fluids that contain water and electrolytes. HOME CARE INSTRUCTIONS   Ask your caregiver about specific rehydration instructions.  Drink enough fluids to keep your urine clear or pale yellow.  Drink small amounts frequently if you have nausea and vomiting.  Eat as you normally do.  Avoid:  Foods or drinks high in sugar.  Carbonated  drinks.  Juice.  Extremely hot or cold fluids.  Drinks with caffeine.  Fatty, greasy foods.  Alcohol.  Tobacco.  Overeating.  Gelatin desserts.  Wash your hands well to avoid spreading bacteria and viruses.  Only take over-the-counter or prescription medicines for pain, discomfort, or fever as directed by your caregiver.  Ask your caregiver if you should continue all prescribed and over-the-counter medicines.  Keep all follow-up appointments with your caregiver. SEEK MEDICAL CARE IF:  You have abdominal pain and it increases or stays in one area (localizes).  You have a rash, stiff neck, or severe headache.  You are irritable, sleepy, or difficult to awaken.  You are weak, dizzy, or extremely thirsty. SEEK IMMEDIATE MEDICAL CARE IF:   You are unable to keep fluids down or you get worse despite treatment.  You have frequent episodes of vomiting or diarrhea.  You have blood or green matter (bile) in your vomit.  You have blood in your stool or your stool looks black and tarry.  You have not urinated in 6 to 8 hours, or you have only urinated a small amount of very dark urine.  You have a fever.  You faint. MAKE SURE YOU:   Understand these instructions.  Will watch your condition.  Will get help right away if you are not doing well or get worse. Document Released: 04/21/2005 Document Revised: 07/14/2011 Document Reviewed: 12/09/2010 Exit[employer] Patient Information 2015 [employer]. This information is not intended to replace advice given to you by your health care provider. Make sure you discuss any questions you have with your health care   provider.  

## 2013-11-28 NOTE — ED Notes (Signed)
Pt's O2 sats on room air between 89-92%, placed on O2 2L, via n/c, sats 96%.

## 2013-11-28 NOTE — ED Notes (Signed)
PA at bedside.

## 2013-11-28 NOTE — ED Notes (Signed)
Pt reports nausea, headache x 1 week, also reports body aches and mild upper abdominal pain.

## 2013-11-29 DIAGNOSIS — E039 Hypothyroidism, unspecified: Secondary | ICD-10-CM | POA: Diagnosis not present

## 2013-11-29 DIAGNOSIS — M549 Dorsalgia, unspecified: Secondary | ICD-10-CM | POA: Diagnosis not present

## 2013-11-29 DIAGNOSIS — I10 Essential (primary) hypertension: Secondary | ICD-10-CM | POA: Diagnosis not present

## 2013-11-29 NOTE — ED Provider Notes (Signed)
CSN: [id number]     Arrival date & time 11/28/13  1104 History   First MD Initiated Contact with Patient 11/28/13 1150     Chief Complaint  Patient presents with  . Nausea  . Generalized Body Aches    HPI Patient reports nausea and headache over the past week.  She was seen in the emergency department 2 days ago and had a negative head CT at that time.  She states that she is feeling somewhat better at time of discharge.  Since going home she is continuing to have some myalgias and body aches.  She denies significant abdominal pain.  She reports no diarrhea.  Denies melena or hematochezia.  No hematemesis.  Mild decreased oral intake.   Past Medical History  Diagnosis Date  . Depression   . GERD (gastroesophageal reflux disease)   . Thyroid disease     hypothyroidism  . Hyperlipidemia   . Endometriosis   . Osteoarthritis   . Osteopenia   . Scoliosis   . Kidney stones   . Hypothyroidism   . Hypertension   . H/O hiatal hernia     small Hital Hernia  . Psoriasis     Chest, Back and face  . Constipation   . Heel fracture   . Psoriasis     dx'd @10yrs . ago.   Past Surgical History  Procedure Laterality Date  . Lipoma excision  2012     Back  . Abdominal hysterectomy    . Cardiac catheterization  2011  . Eye surgery  4 years ago    cataract with lens- bil  . Breast lumpectomy      20 years ago- bengin  . Nephrolithotomy      for kidney stone- > 2 years.  . Lumbar laminectomy/decompression microdiscectomy  06/04/2011    Procedure: LUMBAR LAMINECTOMY/DECOMPRESSION MICRODISCECTOMY;  Surgeon: Floyce Stakes, MD;  Location: [place] [facility];  Service: Neurosurgery;  Laterality: N/A;  Lumbar Four-Five partial Lumbar Three Laminectomy   Family History  Problem Relation Age of Onset  . Anesthesia problems Mother    History  Substance Use Topics  . Smoking status: Never Smoker   . Smokeless tobacco: Not on file  . Alcohol Use: No   OB History   Grav Para Term Preterm  Abortions TAB SAB Ect Mult Living                 Review of Systems  All other systems reviewed and are negative.     Allergies  Ciprofloxacin; Diclofenac sodium; Etodolac; Penicillins; Relafen; Sulfa drugs cross reactors; Valacyclovir hcl; and Lisinopril  Home Medications   Prior to Admission medications   Medication Sig Start Date End Date Taking? Authorizing Provider  acetaminophen (TYLENOL) 500 MG tablet Take 1,000 mg by mouth 2 (two) times daily as needed for moderate pain.    Yes Historical Provider, MD  amLODipine (NORVASC) 5 MG tablet Take 5 mg by mouth every evening.    Yes Historical Provider, MD  aspirin 81 MG tablet Take 81 mg by mouth every morning.    Yes Historical Provider, MD  atorvastatin (LIPITOR) 20 MG tablet Take 20 mg by mouth every evening.    Yes Historical Provider, MD  butalbital-acetaminophen-caffeine (FIORICET, ESGIC) 50-325-40 MG per tablet Take 1-2 tablets by mouth every 6 (six) hours as needed for headache.   Yes Historical Provider, MD  FLUoxetine (PROZAC) 10 MG tablet Take 10 mg by mouth every morning.    Yes Historical Provider, MD  levothyroxine (SYNTHROID, LEVOTHROID) 75 MCG tablet Take 75 mcg by mouth daily before breakfast.    Yes Historical Provider, MD  losartan (COZAAR) 50 MG tablet Take 50 mg by mouth every morning.    Yes Historical Provider, MD  Oxycodone HCl 10 MG TABS Take 10 mg by mouth 4 (four) times daily.   Yes Historical Provider, MD  ondansetron (ZOFRAN ODT) 8 MG disintegrating tablet Take 1 tablet (8 mg total) by mouth every 8 (eight) hours as needed for nausea or vomiting. 11/28/13   Hoy Morn, MD   BP 131/73  Pulse 89  Temp(Src) 97.7 F (36.5 C) (Oral)  Resp 12  SpO2 93% Physical Exam  Nursing note and vitals reviewed. Constitutional: She is oriented to person, place, and time. She appears well-developed and well-nourished. No distress.  HENT:  Head: Normocephalic and atraumatic.  Eyes: EOM are normal.  Neck: Normal  range of motion.  Cardiovascular: Normal rate, regular rhythm and normal heart sounds.   Pulmonary/Chest: Effort normal and breath sounds normal.  Abdominal: Soft. She exhibits no distension. There is no tenderness.  Musculoskeletal: Normal range of motion.  Neurological: She is alert and oriented to person, place, and time.  Skin: Skin is warm and dry.  Psychiatric: She has a normal mood and affect. Judgment normal.    ED Course  Procedures (including critical care time) Labs Review Labs Reviewed  CBC WITH DIFFERENTIAL - Abnormal; Notable for the following:    Hemoglobin 11.8 (*)    All other components within normal limits  COMPREHENSIVE METABOLIC PANEL - Abnormal; Notable for the following:    Albumin 2.9 (*)    ALT 39 (*)    GFR calc non Af Amer 60 (*)    GFR calc Af Amer 70 (*)    All other components within normal limits  TROPONIN I    Imaging Review Dg Chest 2 View  11/28/2013   CLINICAL DATA:  Nausea. Body aches. Headache. Current history of hypertension.  EXAM: CHEST  2 VIEW  COMPARISON:  01/29/2011, 12/25/2009.  FINDINGS: Cardiac silhouette normal in size, unchanged. Thoracic aorta atherosclerotic, unchanged. Large hiatal hernia, increased in size since prior examinations. Linear atelectasis in the left lower lobe. Lungs otherwise clear. No localized airspace consolidation. No pleural effusions. No pneumothorax. Normal pulmonary vascularity. Thoracic scoliosis convex right.  IMPRESSION: 1. Linear atelectasis in the left lower lobe. No acute cardiopulmonary disease otherwise. 2. Large hiatal hernia, increased in size since 2012.   Electronically Signed   By: Evangeline Dakin M.D.   On: 11/28/2013 12:59     EKG Interpretation   Date/Time:  Monday November 28 2013 12:21:07 EDT Ventricular Rate:  86 PR Interval:  126 QRS Duration: 101 QT Interval:  363 QTC Calculation: 434 R Axis:   85 Text Interpretation:  Sinus rhythm Borderline right axis deviation No  significant  change was found Confirmed by Cayde Held  MD, Uyen Eichholz ([id number]) on  11/28/2013 4:31:05 PM      MDM   Final diagnoses:  Nausea  Dehydration    Patient feels much better after IV fluids.  Likely volume depletion.  Nausea improved.  Tolerating oral fluids.  No urinary complaints.  Labs without significant abnormality.  EKG unremarkable    Hoy Morn, MD 11/29/13 [phone]

## 2013-12-09 ENCOUNTER — Emergency Department (HOSPITAL_COMMUNITY)
Admission: EM | Admit: 2013-12-09 | Discharge: 2013-12-09 | Disposition: A | Discharge status: Home / Self Care | Payer: Medicare Other | Attending: Emergency Medicine | Admitting: Emergency Medicine

## 2013-12-09 ENCOUNTER — Encounter (HOSPITAL_COMMUNITY): Payer: Self-pay | Admitting: Emergency Medicine

## 2013-12-09 DIAGNOSIS — F329 Major depressive disorder, single episode, unspecified: Secondary | ICD-10-CM | POA: Diagnosis not present

## 2013-12-09 DIAGNOSIS — N390 Urinary tract infection, site not specified: Secondary | ICD-10-CM | POA: Insufficient documentation

## 2013-12-09 DIAGNOSIS — F3289 Other specified depressive episodes: Secondary | ICD-10-CM | POA: Diagnosis not present

## 2013-12-09 DIAGNOSIS — E785 Hyperlipidemia, unspecified: Secondary | ICD-10-CM | POA: Insufficient documentation

## 2013-12-09 DIAGNOSIS — Z9889 Other specified postprocedural states: Secondary | ICD-10-CM | POA: Insufficient documentation

## 2013-12-09 DIAGNOSIS — Z87442 Personal history of urinary calculi: Secondary | ICD-10-CM | POA: Diagnosis not present

## 2013-12-09 DIAGNOSIS — R110 Nausea: Secondary | ICD-10-CM | POA: Diagnosis not present

## 2013-12-09 DIAGNOSIS — E039 Hypothyroidism, unspecified: Secondary | ICD-10-CM | POA: Diagnosis not present

## 2013-12-09 DIAGNOSIS — Z8719 Personal history of other diseases of the digestive system: Secondary | ICD-10-CM | POA: Diagnosis not present

## 2013-12-09 DIAGNOSIS — Z872 Personal history of diseases of the skin and subcutaneous tissue: Secondary | ICD-10-CM | POA: Diagnosis not present

## 2013-12-09 DIAGNOSIS — Z7982 Long term (current) use of aspirin: Secondary | ICD-10-CM | POA: Diagnosis not present

## 2013-12-09 DIAGNOSIS — R350 Frequency of micturition: Secondary | ICD-10-CM | POA: Diagnosis not present

## 2013-12-09 DIAGNOSIS — I10 Essential (primary) hypertension: Secondary | ICD-10-CM | POA: Insufficient documentation

## 2013-12-09 DIAGNOSIS — Z880 Allergy status to penicillin: Secondary | ICD-10-CM | POA: Diagnosis not present

## 2013-12-09 DIAGNOSIS — M1990 Unspecified osteoarthritis, unspecified site: Secondary | ICD-10-CM | POA: Diagnosis not present

## 2013-12-09 LAB — CBC WITH DIFFERENTIAL/PLATELET
Basophils Absolute: 0.1 10*3/uL (ref 0.0–0.1)
Basophils Relative: 1 % (ref 0–1)
Eosinophils Absolute: 0.2 10*3/uL (ref 0.0–0.7)
Eosinophils Relative: 3 % (ref 0–5)
HCT: 36.1 % (ref 36.0–46.0)
Hemoglobin: 11.8 g/dL — ABNORMAL LOW (ref 12.0–15.0)
LYMPHS ABS: 1 10*3/uL (ref 0.7–4.0)
Lymphocytes Relative: 21 % (ref 12–46)
MCH: 30.1 pg (ref 26.0–34.0)
MCHC: 32.7 g/dL (ref 30.0–36.0)
MCV: 92.1 fL (ref 78.0–100.0)
Monocytes Absolute: 0.6 10*3/uL (ref 0.1–1.0)
Monocytes Relative: 13 % — ABNORMAL HIGH (ref 3–12)
NEUTROS ABS: 3 10*3/uL (ref 1.7–7.7)
NEUTROS PCT: 62 % (ref 43–77)
PLATELETS: 308 10*3/uL (ref 150–400)
RBC: 3.92 MIL/uL (ref 3.87–5.11)
RDW: 14.6 % (ref 11.5–15.5)
WBC: 4.8 10*3/uL (ref 4.0–10.5)

## 2013-12-09 LAB — BASIC METABOLIC PANEL
ANION GAP: 12 (ref 5–15)
BUN: 11 mg/dL (ref 6–23)
CHLORIDE: 99 meq/L (ref 96–112)
CO2: 25 mEq/L (ref 19–32)
Calcium: 9.3 mg/dL (ref 8.4–10.5)
Creatinine, Ser: 0.85 mg/dL (ref 0.50–1.10)
GFR, EST AFRICAN AMERICAN: 74 mL/min — AB (ref >90)
GFR, EST NON AFRICAN AMERICAN: 63 mL/min — AB (ref >90)
Glucose, Bld: 110 mg/dL — ABNORMAL HIGH (ref 70–99)
POTASSIUM: 4 meq/L (ref 3.7–5.3)
SODIUM: 136 meq/L — AB (ref 137–147)

## 2013-12-09 LAB — URINALYSIS, ROUTINE W REFLEX MICROSCOPIC
Bilirubin Urine: NEGATIVE
Glucose, UA: NEGATIVE mg/dL
Hgb urine dipstick: NEGATIVE
Ketones, ur: NEGATIVE mg/dL
NITRITE: NEGATIVE
PROTEIN: NEGATIVE mg/dL
SPECIFIC GRAVITY, URINE: 1.013 (ref 1.005–1.030)
UROBILINOGEN UA: 0.2 mg/dL (ref 0.0–1.0)
pH: 7.5 (ref 5.0–8.0)

## 2013-12-09 LAB — URINE MICROSCOPIC-ADD ON

## 2013-12-09 MED ORDER — NITROFURANTOIN MONOHYD MACRO 100 MG PO CAPS: 100.0000 mg | ORAL_CAPSULE | Freq: Two times a day (BID) | ORAL | Status: DC

## 2013-12-09 MED ORDER — PHENAZOPYRIDINE HCL 200 MG PO TABS: 200.0000 mg | ORAL_TABLET | Freq: Three times a day (TID) | ORAL | Status: DC

## 2013-12-09 MED ORDER — TRAZODONE HCL 50 MG PO TABS: 25.0000 mg | ORAL_TABLET | Freq: Every day | ORAL | Status: DC

## 2013-12-09 MED ORDER — SODIUM CHLORIDE 0.9 % IV BOLUS (SEPSIS)
500.0000 mL | Freq: Once | INTRAVENOUS | Status: AC
  Administered 2013-12-09: 500 mL via INTRAVENOUS

## 2013-12-09 MED ORDER — ONDANSETRON HCL 4 MG/2ML IJ SOLN
4.0000 mg | Freq: Once | INTRAMUSCULAR | Status: AC
Start: 2013-12-09 — End: 2013-12-09
  Administered 2013-12-09: 4 mg via INTRAVENOUS
  Filled 2013-12-09: qty 2

## 2013-12-09 MED ORDER — CEFTRIAXONE SODIUM 1 G IJ SOLR
1.0000 g | Freq: Once | INTRAMUSCULAR | Status: AC
  Administered 2013-12-09: 1 g via INTRAVENOUS
  Filled 2013-12-09: qty 10

## 2013-12-09 MED ORDER — SODIUM CHLORIDE 0.9 % IV SOLN
Freq: Once | INTRAVENOUS | Status: AC
  Administered 2013-12-09: 10:00:00 via INTRAVENOUS

## 2013-12-09 MED ORDER — PHENAZOPYRIDINE HCL 100 MG PO TABS
100.0000 mg | ORAL_TABLET | Freq: Once | ORAL | Status: AC
  Administered 2013-12-09: 100 mg via ORAL
  Filled 2013-12-09: qty 1

## 2013-12-09 NOTE — Discharge Instructions (Signed)

## 2013-12-09 NOTE — ED Provider Notes (Signed)
CSN: [id number]     Arrival date & time 12/09/13  0915 History   First MD Initiated Contact with Patient 12/09/13 [phone]     Chief Complaint  Patient presents with  . Urinary Frequency  . Dehydration      HPI  Pt presents with a CC of Nocturia.  Has been seen here on 7-25, with a c/o Headache, and again on 7-27 c a c/o Nausea.  Tells me today that her main concern for the last 6 weeks has been nocturia.  She saw Dr. Deneen Harts at [facility] 4 weeks ago and was started on Myrbetriq. Increased from 25mg  qhs to 50 mg qhs after first week.  Still has 8-10 episodes of nocturia nightly.  No hesitancy, no dysuria.  No AP or flank pain. States "I keep nausea all the time"  States that she is just miserable, and questions as to whether she could "just stay here".  Past Medical History  Diagnosis Date  . Depression   . GERD (gastroesophageal reflux disease)   . Thyroid disease     hypothyroidism  . Hyperlipidemia   . Endometriosis   . Osteoarthritis   . Osteopenia   . Scoliosis   . Kidney stones   . Hypothyroidism   . Hypertension   . H/O hiatal hernia     small Hital Hernia  . Psoriasis     Chest, Back and face  . Constipation   . Heel fracture   . Psoriasis     dx'd @10yrs . ago.   Past Surgical History  Procedure Laterality Date  . Lipoma excision  2012     Back  . Abdominal hysterectomy    . Cardiac catheterization  2011  . Eye surgery  4 years ago    cataract with lens- bil  . Breast lumpectomy      20 years ago- bengin  . Nephrolithotomy      for kidney stone- > 2 years.  . Lumbar laminectomy/decompression microdiscectomy  06/04/2011    Procedure: LUMBAR LAMINECTOMY/DECOMPRESSION MICRODISCECTOMY;  Surgeon: Floyce Stakes, MD;  Location: [place] [facility];  Service: Neurosurgery;  Laterality: N/A;  Lumbar Four-Five partial Lumbar Three Laminectomy   Family History  Problem Relation Age of Onset  . Anesthesia problems Mother    History  Substance Use Topics  .  Smoking status: Never Smoker   . Smokeless tobacco: Not on file  . Alcohol Use: No   OB History   Grav Para Term Preterm Abortions TAB SAB Ect Mult Living                 Review of Systems  Constitutional: Negative for fever, chills, diaphoresis, appetite change and fatigue.  HENT: Negative for mouth sores, sore throat and trouble swallowing.   Eyes: Negative for visual disturbance.  Respiratory: Negative for cough, chest tightness, shortness of breath and wheezing.   Cardiovascular: Negative for chest pain.  Gastrointestinal: Positive for nausea. Negative for vomiting, abdominal pain, diarrhea and abdominal distention.  Endocrine: Negative for polydipsia, polyphagia and polyuria.  Genitourinary: Positive for urgency and frequency. Negative for dysuria and hematuria.       Nocturia  Musculoskeletal: Negative for gait problem.  Skin: Negative for color change, pallor and rash.  Neurological: Negative for dizziness, syncope, light-headedness and headaches.  Hematological: Does not bruise/bleed easily.  Psychiatric/Behavioral: Negative for behavioral problems and confusion.      Allergies  Ciprofloxacin; Diclofenac sodium; Etodolac; Penicillins; Relafen; Sulfa drugs cross reactors; and Valacyclovir hcl  Home Medications   Prior to Admission medications   Medication Sig Start Date End Date Taking? Authorizing Provider  acetaminophen (TYLENOL) 500 MG tablet Take 1,000 mg by mouth 2 (two) times daily as needed for moderate pain.    Yes Historical Provider, MD  amLODipine (NORVASC) 5 MG tablet Take 5 mg by mouth every evening.    Yes Historical Provider, MD  aspirin 81 MG tablet Take 81 mg by mouth every morning.    Yes Historical Provider, MD  atorvastatin (LIPITOR) 20 MG tablet Take 20 mg by mouth every evening.    Yes Historical Provider, MD  butalbital-acetaminophen-caffeine (FIORICET, ESGIC) 50-325-40 MG per tablet Take 1-2 tablets by mouth every 6 (six) hours as needed for  headache.   Yes Historical Provider, MD  FLUoxetine (PROZAC) 10 MG tablet Take 10 mg by mouth every morning.    Yes Historical Provider, MD  levothyroxine (SYNTHROID, LEVOTHROID) 75 MCG tablet Take 75 mcg by mouth daily before breakfast.    Yes Historical Provider, MD  losartan (COZAAR) 50 MG tablet Take 50 mg by mouth every morning.    Yes Historical Provider, MD  mirabegron ER (MYRBETRIQ) 50 MG TB24 tablet Take 50 mg by mouth daily.   Yes Historical Provider, MD  ondansetron (ZOFRAN ODT) 8 MG disintegrating tablet Take 1 tablet (8 mg total) by mouth every 8 (eight) hours as needed for nausea or vomiting. 11/28/13  Yes Hoy Morn, MD  Oxycodone HCl 10 MG TABS Take 10 mg by mouth every 4 (four) hours.    Yes Historical Provider, MD  PRESCRIPTION MEDICATION Steroid shot at dr's office   Yes Historical Provider, MD  nitrofurantoin, macrocrystal-monohydrate, (MACROBID) 100 MG capsule Take 1 capsule (100 mg total) by mouth 2 (two) times daily. 12/09/13   Tanna Furry, MD  phenazopyridine (PYRIDIUM) 200 MG tablet Take 1 tablet (200 mg total) by mouth 3 (three) times daily. 12/09/13   Tanna Furry, MD  traZODone (DESYREL) 50 MG tablet Take 0.5 tablets (25 mg total) by mouth at bedtime. 12/09/13   Tanna Furry, MD   BP 124/73  Pulse 82  Temp(Src) 98.2 F (36.8 C) (Oral)  Resp 16  SpO2 96% Physical Exam  Constitutional: She is oriented to person, place, and time. She appears well-developed and well-nourished. No distress.  HENT:  Head: Normocephalic.  Eyes: Conjunctivae are normal. Pupils are equal, round, and reactive to light. No scleral icterus.  Neck: Normal range of motion. Neck supple. No thyromegaly present.  Cardiovascular: Normal rate and regular rhythm.  Exam reveals no gallop and no friction rub.   No murmur heard. Pulmonary/Chest: Effort normal and breath sounds normal. No respiratory distress. She has no wheezes. She has no rales.  Abdominal: Soft. Bowel sounds are normal. She exhibits no  distension. There is no tenderness. There is no rebound.  Musculoskeletal: Normal range of motion.  Neurological: She is alert and oriented to person, place, and time.  Skin: Skin is warm and dry. No rash noted.  Psychiatric: She has a normal mood and affect. Her behavior is normal.    ED Course  Procedures (including critical care time) Labs Review Labs Reviewed  CBC WITH DIFFERENTIAL - Abnormal; Notable for the following:    Hemoglobin 11.8 (*)    Monocytes Relative 13 (*)    All other components within normal limits  BASIC METABOLIC PANEL - Abnormal; Notable for the following:    Sodium 136 (*)    Glucose, Bld 110 (*)    GFR  calc non Af Amer 63 (*)    GFR calc Af Amer 74 (*)    All other components within normal limits  URINALYSIS, ROUTINE W REFLEX MICROSCOPIC - Abnormal; Notable for the following:    Leukocytes, UA TRACE (*)    All other components within normal limits  URINE MICROSCOPIC-ADD ON - Abnormal; Notable for the following:    Bacteria, UA FEW (*)    All other components within normal limits  URINE CULTURE    Imaging Review No results found.   EKG Interpretation None      MDM   Final diagnoses:  UTI (lower urinary tract infection)    UA suggests infection.  Culture pending.  Renal function normal.  Given IV antibiotics.  Rather than change her Myrbetriq from her urologist, I will treat her UTI, give her pyridium, and Low dose trazodone for sleep, and ask her to follow up with either her Urologist or PCP.    Tanna Furry, MD 12/09/13 1315

## 2013-12-09 NOTE — ED Notes (Signed)
Pt reports this is the 3rd ED visit for same complaint. Pt has urinary frequency. Urinates 7-8 times per night, has difficulty staying hydrated. Went to kidney doctor and was given a medicine with no relief. Has an appointment in 6 weeks, but cannot wait that long. Reports nausea.

## 2013-12-10 LAB — URINE CULTURE: Colony Count: 45000

## 2013-12-14 ENCOUNTER — Other Ambulatory Visit: Payer: Self-pay | Admitting: Family Medicine

## 2013-12-14 DIAGNOSIS — R079 Chest pain, unspecified: Secondary | ICD-10-CM | POA: Diagnosis not present

## 2013-12-14 DIAGNOSIS — R110 Nausea: Secondary | ICD-10-CM

## 2013-12-14 DIAGNOSIS — K449 Diaphragmatic hernia without obstruction or gangrene: Secondary | ICD-10-CM | POA: Diagnosis not present

## 2013-12-14 DIAGNOSIS — E039 Hypothyroidism, unspecified: Secondary | ICD-10-CM | POA: Diagnosis not present

## 2013-12-15 ENCOUNTER — Ambulatory Visit
Admission: RE | Admit: 2013-12-15 | Discharge: 2013-12-15 | Disposition: A | Discharge status: Home / Self Care | Payer: Medicare Other | Source: Ambulatory Visit | Attending: Family Medicine | Admitting: Family Medicine

## 2013-12-15 DIAGNOSIS — J90 Pleural effusion, not elsewhere classified: Secondary | ICD-10-CM | POA: Diagnosis not present

## 2013-12-15 DIAGNOSIS — J9819 Other pulmonary collapse: Secondary | ICD-10-CM | POA: Diagnosis not present

## 2013-12-15 DIAGNOSIS — R110 Nausea: Secondary | ICD-10-CM

## 2013-12-15 DIAGNOSIS — R079 Chest pain, unspecified: Secondary | ICD-10-CM

## 2013-12-15 MED ORDER — IOHEXOL 300 MG/ML  SOLN
75.0000 mL | Freq: Once | INTRAMUSCULAR | Status: AC | PRN
  Administered 2013-12-15: 75 mL via INTRAVENOUS

## 2013-12-21 DIAGNOSIS — M1710 Unilateral primary osteoarthritis, unspecified knee: Secondary | ICD-10-CM | POA: Diagnosis not present

## 2013-12-21 DIAGNOSIS — G894 Chronic pain syndrome: Secondary | ICD-10-CM | POA: Diagnosis not present

## 2013-12-21 DIAGNOSIS — M5137 Other intervertebral disc degeneration, lumbosacral region: Secondary | ICD-10-CM | POA: Diagnosis not present

## 2013-12-23 DIAGNOSIS — K449 Diaphragmatic hernia without obstruction or gangrene: Secondary | ICD-10-CM | POA: Diagnosis not present

## 2013-12-28 DIAGNOSIS — M1710 Unilateral primary osteoarthritis, unspecified knee: Secondary | ICD-10-CM | POA: Diagnosis not present

## 2013-12-28 DIAGNOSIS — M25569 Pain in unspecified knee: Secondary | ICD-10-CM | POA: Diagnosis not present

## 2013-12-29 DIAGNOSIS — R351 Nocturia: Secondary | ICD-10-CM | POA: Diagnosis not present

## 2013-12-29 DIAGNOSIS — R350 Frequency of micturition: Secondary | ICD-10-CM | POA: Diagnosis not present

## 2014-01-17 ENCOUNTER — Other Ambulatory Visit: Payer: Self-pay | Admitting: Gastroenterology

## 2014-01-17 DIAGNOSIS — R6881 Early satiety: Secondary | ICD-10-CM | POA: Diagnosis not present

## 2014-01-17 DIAGNOSIS — R1084 Generalized abdominal pain: Secondary | ICD-10-CM

## 2014-01-17 DIAGNOSIS — R1013 Epigastric pain: Secondary | ICD-10-CM | POA: Diagnosis not present

## 2014-01-17 DIAGNOSIS — R112 Nausea with vomiting, unspecified: Secondary | ICD-10-CM

## 2014-01-26 ENCOUNTER — Ambulatory Visit
Admission: RE | Admit: 2014-01-26 | Discharge: 2014-01-26 | Disposition: A | Discharge status: Home / Self Care | Payer: Medicare Other | Source: Ambulatory Visit | Attending: Gastroenterology | Admitting: Gastroenterology

## 2014-01-26 DIAGNOSIS — N200 Calculus of kidney: Secondary | ICD-10-CM | POA: Diagnosis not present

## 2014-01-26 DIAGNOSIS — R1084 Generalized abdominal pain: Secondary | ICD-10-CM

## 2014-01-26 DIAGNOSIS — K7689 Other specified diseases of liver: Secondary | ICD-10-CM | POA: Diagnosis not present

## 2014-01-26 DIAGNOSIS — I10 Essential (primary) hypertension: Secondary | ICD-10-CM | POA: Diagnosis not present

## 2014-01-26 DIAGNOSIS — R112 Nausea with vomiting, unspecified: Secondary | ICD-10-CM

## 2014-01-31 DIAGNOSIS — N390 Urinary tract infection, site not specified: Secondary | ICD-10-CM | POA: Diagnosis not present

## 2014-02-09 ENCOUNTER — Telehealth: Payer: Self-pay | Admitting: Interventional Cardiology

## 2014-02-09 NOTE — Telephone Encounter (Addendum)
OK no further cardiac testing needed before surgery as long as she is doing well currently.  Will surgery be before her next appt with me?

## 2014-02-09 NOTE — Telephone Encounter (Signed)
To Dr. Varanasi, please advise.  

## 2014-02-09 NOTE — Telephone Encounter (Signed)
New Message  Pt called having knee surgery, Will need clearance. Please call back to discuss

## 2014-02-09 NOTE — Telephone Encounter (Addendum)
Spoke with pt and she states she is currently feeling well. Surgery will be 03/21/14 and pt will see Dr. Irish Lack on 03/01/14. Faxed to Dr. Paralee Cancel.

## 2014-02-13 DIAGNOSIS — Z85828 Personal history of other malignant neoplasm of skin: Secondary | ICD-10-CM | POA: Diagnosis not present

## 2014-02-13 DIAGNOSIS — Z08 Encounter for follow-up examination after completed treatment for malignant neoplasm: Secondary | ICD-10-CM | POA: Diagnosis not present

## 2014-02-13 DIAGNOSIS — L408 Other psoriasis: Secondary | ICD-10-CM | POA: Diagnosis not present

## 2014-02-13 DIAGNOSIS — L821 Other seborrheic keratosis: Secondary | ICD-10-CM | POA: Diagnosis not present

## 2014-02-14 DIAGNOSIS — Z23 Encounter for immunization: Secondary | ICD-10-CM | POA: Diagnosis not present

## 2014-02-14 DIAGNOSIS — E780 Pure hypercholesterolemia: Secondary | ICD-10-CM | POA: Diagnosis not present

## 2014-02-14 DIAGNOSIS — L400 Psoriasis vulgaris: Secondary | ICD-10-CM | POA: Diagnosis not present

## 2014-02-14 DIAGNOSIS — I10 Essential (primary) hypertension: Secondary | ICD-10-CM | POA: Diagnosis not present

## 2014-02-14 DIAGNOSIS — E039 Hypothyroidism, unspecified: Secondary | ICD-10-CM | POA: Diagnosis not present

## 2014-02-14 DIAGNOSIS — M1990 Unspecified osteoarthritis, unspecified site: Secondary | ICD-10-CM | POA: Diagnosis not present

## 2014-02-14 DIAGNOSIS — F329 Major depressive disorder, single episode, unspecified: Secondary | ICD-10-CM | POA: Diagnosis not present

## 2014-02-14 DIAGNOSIS — K219 Gastro-esophageal reflux disease without esophagitis: Secondary | ICD-10-CM | POA: Diagnosis not present

## 2014-02-14 DIAGNOSIS — F419 Anxiety disorder, unspecified: Secondary | ICD-10-CM | POA: Diagnosis not present

## 2014-02-15 DIAGNOSIS — G894 Chronic pain syndrome: Secondary | ICD-10-CM | POA: Diagnosis not present

## 2014-02-15 DIAGNOSIS — N200 Calculus of kidney: Secondary | ICD-10-CM | POA: Diagnosis not present

## 2014-02-15 DIAGNOSIS — M5136 Other intervertebral disc degeneration, lumbar region: Secondary | ICD-10-CM | POA: Diagnosis not present

## 2014-02-15 DIAGNOSIS — Z79891 Long term (current) use of opiate analgesic: Secondary | ICD-10-CM | POA: Diagnosis not present

## 2014-02-15 DIAGNOSIS — R312 Other microscopic hematuria: Secondary | ICD-10-CM | POA: Diagnosis not present

## 2014-02-15 DIAGNOSIS — M25562 Pain in left knee: Secondary | ICD-10-CM | POA: Diagnosis not present

## 2014-02-15 DIAGNOSIS — R109 Unspecified abdominal pain: Secondary | ICD-10-CM | POA: Diagnosis not present

## 2014-02-16 DIAGNOSIS — M25562 Pain in left knee: Secondary | ICD-10-CM | POA: Diagnosis not present

## 2014-02-22 DIAGNOSIS — N200 Calculus of kidney: Secondary | ICD-10-CM | POA: Diagnosis not present

## 2014-02-23 ENCOUNTER — Other Ambulatory Visit: Payer: Self-pay | Admitting: Urology

## 2014-02-27 ENCOUNTER — Encounter (HOSPITAL_COMMUNITY): Payer: Self-pay | Admitting: Pharmacy Technician

## 2014-03-01 ENCOUNTER — Telehealth: Payer: Self-pay | Admitting: Interventional Cardiology

## 2014-03-01 ENCOUNTER — Ambulatory Visit (INDEPENDENT_AMBULATORY_CARE_PROVIDER_SITE_OTHER): Payer: Medicare Other | Admitting: Interventional Cardiology

## 2014-03-01 ENCOUNTER — Encounter: Payer: Self-pay | Admitting: Interventional Cardiology

## 2014-03-01 VITALS — BP 120/80 | HR 68 | Ht 63.0 in | Wt 170.8 lb

## 2014-03-01 DIAGNOSIS — E785 Hyperlipidemia, unspecified: Secondary | ICD-10-CM | POA: Diagnosis not present

## 2014-03-01 DIAGNOSIS — Z01810 Encounter for preprocedural cardiovascular examination: Secondary | ICD-10-CM | POA: Diagnosis not present

## 2014-03-01 DIAGNOSIS — I10 Essential (primary) hypertension: Secondary | ICD-10-CM | POA: Diagnosis not present

## 2014-03-01 NOTE — Telephone Encounter (Signed)
Received request from Nurse fax box, documents faxed for surgical clearance. To: [employer] Fax number: [phone] Attention: 10.28.15/km

## 2014-03-01 NOTE — Patient Instructions (Signed)
Your physician wants you to follow-up in: 1 year with Dr. Irish Lack. You will receive a reminder letter in the mail two months in advance. If you don't receive a letter, please call our office to schedule the follow-up appointment.  Your physician recommends that you continue on your current medications as directed. Please refer to the Current Medication list given to you today.

## 2014-03-01 NOTE — Progress Notes (Signed)
Patient ID: JENIKA CHIEM, female   DOB: 03-28-1933, 80 y.o.   MRN: [id number] Patient ID: LIVY ROSS, female   DOB: 1932-11-03, 80 y.o.   MRN: [id number]    [facility], [address], [place]  [address] Phone: [phone] Fax:  [phone]  Date:  03/01/2014   ID:  CALIANNA KIM, DOB 1933/02/03, MRN [id number]  PCP:  [name], MD      History of Present Illness: NIRVANA BLANCHETT is a 80 y.o. female who has had difficult to control BP. Bystolic was changed to losartan. Nightmares have resolved and no spikes in her BP. BP readings ar enow low at times. Walking limited by back pain. Only walking in the grocery store with a cart. She received steroid injections as well at one point but they seemed to stop working. Now taking Percocet for pain from Oxycontin over the past 8 months.  She had headaches about 6 months ago associated with headaches.  She started taking some BP meds before going to bed at night. In the past few months, BP has been better controlled. She has some fatigue when BP low.  No recent high readings.     Wt Readings from Last 3 Encounters:  03/01/14 170 lb 12.8 oz (77.474 kg)  02/22/13 184 lb 12.8 oz (83.825 kg)  06/04/11 180 lb 4.8 oz (81.784 kg)     Past Medical History  Diagnosis Date  . Depression   . GERD (gastroesophageal reflux disease)   . Thyroid disease     hypothyroidism  . Hyperlipidemia   . Endometriosis   . Osteoarthritis   . Osteopenia   . Scoliosis   . Kidney stones   . Hypothyroidism   . Hypertension   . H/O hiatal hernia     small Hital Hernia  . Psoriasis     Chest, Back and face  . Constipation   . Heel fracture   . Psoriasis     dx'd @10yrs . ago.    Current Outpatient Prescriptions  Medication Sig Dispense Refill  . amLODipine (NORVASC) 5 MG tablet Take 5 mg by mouth every morning.       Marland Kitchen aspirin 81 MG tablet Take 81 mg by mouth every morning.       Marland Kitchen atorvastatin (LIPITOR) 20 MG tablet Take 20 mg by mouth  every evening.       Marland Kitchen b complex vitamins tablet Take 1 tablet by mouth every morning.      . Calcium Carbonate (CALCIUM 600 PO) Take 1 tablet by mouth 2 (two) times daily.      Marland Kitchen FLUoxetine (PROZAC) 10 MG tablet Take 10 mg by mouth every morning.       Marland Kitchen levothyroxine (SYNTHROID, LEVOTHROID) 75 MCG tablet Take 75 mcg by mouth daily before breakfast.       . losartan (COZAAR) 50 MG tablet Take 50 mg by mouth every evening.       . Multiple Vitamin (MULTIVITAMIN WITH MINERALS) TABS tablet Take 1 tablet by mouth every morning.      Marland Kitchen oxyCODONE-acetaminophen (PERCOCET) 10-325 MG per tablet Take 1 tablet by mouth every 4 (four) hours as needed for pain.      Marland Kitchen PRESCRIPTION MEDICATION Inject 1 each into the skin. Steroid shot at dr's office      . Probiotic Product (PROBIOTIC DAILY PO) Take 1 tablet by mouth every morning.      . vitamin B-12 (CYANOCOBALAMIN) 1000 MCG tablet Take 1,000  mcg by mouth every morning.      . vitamin C (ASCORBIC ACID) 500 MG tablet Take 500 mg by mouth 2 (two) times daily.       No current facility-administered medications for this visit.    Allergies:    Allergies  Allergen Reactions  . Colace [Docusate Sodium] Rash  . Ciprofloxacin Other (See Comments)    Metallic taste  . Diclofenac Sodium Other (See Comments)    Afraid bp would spike  . Etodolac Other (See Comments)    Spiked blood pressure   . Penicillins Hives  . Relafen [Nabumetone] Other (See Comments)    Unknown  . Sulfa Drugs Cross Reactors Diarrhea  . Valacyclovir Hcl Other (See Comments)    Possible confusion    Social History:  The patient  reports that she has never smoked. She does not have any smokeless tobacco history on file. She reports that she does not drink alcohol or use illicit drugs.   Family History:  The patient's family history includes Anesthesia problems in her mother.   ROS:  Please see the history of present illness.  No nausea, vomiting.  No fevers, chills.  No focal  weakness.  No dysuria.   All other systems reviewed and negative.   PHYSICAL EXAM: VS:  BP 120/80  Pulse 68  Ht 5\' 3"  (1.6 m)  Wt 170 lb 12.8 oz (77.474 kg)  BMI 30.26 kg/m2 Well nourished, well developed, in no acute distress HEENT: normal Neck: no JVD, no carotid bruits Cardiac:  normal S1, S2; RRR;  Lungs:  clear to auscultation bilaterally, no wheezing, rhonchi or rales Abd: soft, nontender, no hepatomegaly Ext: no edema Skin: warm and dry Neuro:   no focal abnormalities noted  EKG:  Normal    ASSESSMENT AND PLAN:  HTN  Continue Amlodipine Besylate Tablet, 5 MG, one tablet, Orally, Once a day, 30 day(s), 30, Refills 11 Continue Losartan Potassium Tablet, 50 MG, 1 tablet, Orally, Once a day Diagnostic Imaging:EKG Harward,Amy 02/24/2012 11:26:37 AM > Quintavia Rogstad,JAY 02/24/2012 11:42:59 AM > NSR, PRWP, no significant ST segment changes  She has been intolerant of several meds. She is tolerating Norvasc. Inrease fiber for constipation likely related to meds including pain meds. BP increased when Norvasc went to 2.5 mg daily.  2. Hyperlipemia, mixed  Continue Lipitor Tablet, 20 MG, 1 tablet, Orally, Once a day LDL < 100. Well controlled.  LDL 76 in November 2013  Preoperative eval:  She will be having knee replacement.  Se had a clean cath in 2011.  No cardiac sx of late.  Negative troponin during a serious illness in 7/15.  No further cardiac testing.  She has had occasional BP spike in the past.  If this occurs during surgery, this can be managed with IV meds from anestesia.     Signed, Mina Marble, MD, [facility] 03/01/2014 12:22 PM

## 2014-03-02 ENCOUNTER — Encounter (HOSPITAL_COMMUNITY): Payer: Self-pay | Admitting: *Deleted

## 2014-03-09 ENCOUNTER — Ambulatory Visit (HOSPITAL_COMMUNITY)
Admission: RE | Admit: 2014-03-09 | Discharge: 2014-03-09 | Disposition: A | Discharge status: Home / Self Care | Payer: Medicare Other | Source: Ambulatory Visit | Attending: Urology | Admitting: Urology

## 2014-03-09 ENCOUNTER — Ambulatory Visit (HOSPITAL_COMMUNITY): Payer: Medicare Other

## 2014-03-09 ENCOUNTER — Encounter (HOSPITAL_COMMUNITY)
Admission: RE | Disposition: A | Discharge status: Home / Self Care | Payer: Self-pay | Source: Ambulatory Visit | Attending: Urology

## 2014-03-09 DIAGNOSIS — Z7982 Long term (current) use of aspirin: Secondary | ICD-10-CM | POA: Diagnosis not present

## 2014-03-09 DIAGNOSIS — M419 Scoliosis, unspecified: Secondary | ICD-10-CM | POA: Insufficient documentation

## 2014-03-09 DIAGNOSIS — N200 Calculus of kidney: Secondary | ICD-10-CM

## 2014-03-09 DIAGNOSIS — K219 Gastro-esophageal reflux disease without esophagitis: Secondary | ICD-10-CM | POA: Insufficient documentation

## 2014-03-09 DIAGNOSIS — E785 Hyperlipidemia, unspecified: Secondary | ICD-10-CM | POA: Insufficient documentation

## 2014-03-09 DIAGNOSIS — N202 Calculus of kidney with calculus of ureter: Secondary | ICD-10-CM | POA: Insufficient documentation

## 2014-03-09 DIAGNOSIS — Z882 Allergy status to sulfonamides status: Secondary | ICD-10-CM | POA: Diagnosis not present

## 2014-03-09 DIAGNOSIS — Z87442 Personal history of urinary calculi: Secondary | ICD-10-CM | POA: Insufficient documentation

## 2014-03-09 DIAGNOSIS — M1990 Unspecified osteoarthritis, unspecified site: Secondary | ICD-10-CM | POA: Insufficient documentation

## 2014-03-09 DIAGNOSIS — N809 Endometriosis, unspecified: Secondary | ICD-10-CM | POA: Insufficient documentation

## 2014-03-09 DIAGNOSIS — F329 Major depressive disorder, single episode, unspecified: Secondary | ICD-10-CM | POA: Insufficient documentation

## 2014-03-09 DIAGNOSIS — I10 Essential (primary) hypertension: Secondary | ICD-10-CM | POA: Insufficient documentation

## 2014-03-09 DIAGNOSIS — M8580 Other specified disorders of bone density and structure, unspecified site: Secondary | ICD-10-CM | POA: Insufficient documentation

## 2014-03-09 DIAGNOSIS — Z881 Allergy status to other antibiotic agents status: Secondary | ICD-10-CM | POA: Diagnosis not present

## 2014-03-09 DIAGNOSIS — K449 Diaphragmatic hernia without obstruction or gangrene: Secondary | ICD-10-CM | POA: Diagnosis not present

## 2014-03-09 DIAGNOSIS — E039 Hypothyroidism, unspecified: Secondary | ICD-10-CM | POA: Insufficient documentation

## 2014-03-09 DIAGNOSIS — K5900 Constipation, unspecified: Secondary | ICD-10-CM | POA: Diagnosis not present

## 2014-03-09 DIAGNOSIS — Z880 Allergy status to penicillin: Secondary | ICD-10-CM | POA: Diagnosis not present

## 2014-03-09 DIAGNOSIS — Z888 Allergy status to other drugs, medicaments and biological substances status: Secondary | ICD-10-CM | POA: Diagnosis not present

## 2014-03-09 DIAGNOSIS — Z79899 Other long term (current) drug therapy: Secondary | ICD-10-CM | POA: Insufficient documentation

## 2014-03-09 DIAGNOSIS — L409 Psoriasis, unspecified: Secondary | ICD-10-CM | POA: Diagnosis not present

## 2014-03-09 HISTORY — PX: EXTRACORPOREAL SHOCK WAVE LITHOTRIPSY: SHX1557

## 2014-03-09 SURGERY — LITHOTRIPSY, ESWL
Anesthesia: LOCAL | Laterality: Right

## 2014-03-09 MED ORDER — LEVOFLOXACIN 500 MG PO TABS
500.0000 mg | ORAL_TABLET | ORAL | Status: DC
  Filled 2014-03-09 (×2): qty 1

## 2014-03-09 MED ORDER — DIPHENHYDRAMINE HCL 25 MG PO CAPS
25.0000 mg | ORAL_CAPSULE | ORAL | Status: AC
  Administered 2014-03-09: 25 mg via ORAL
  Filled 2014-03-09: qty 1

## 2014-03-09 MED ORDER — DEXTROSE-NACL 5-0.45 % IV SOLN
INTRAVENOUS | Status: DC
Start: 2014-03-09 — End: 2014-03-09
  Administered 2014-03-09: 09:00:00 via INTRAVENOUS

## 2014-03-09 MED ORDER — DIAZEPAM 5 MG PO TABS
10.0000 mg | ORAL_TABLET | ORAL | Status: AC
  Administered 2014-03-09: 10 mg via ORAL
  Filled 2014-03-09: qty 2

## 2014-03-09 NOTE — Op Note (Signed)
See [facility] OP note scanned into chart. 

## 2014-03-09 NOTE — H&P (Signed)
H&P  Chief Complaint: Right upper ureteral stone  History of Present Illness: Becky Duran is a 80 y.o. year old female who presents at this time for lithotripsy, as management of a symptomatic right upper ureteral/UPJ stone.  Past Medical History  Diagnosis Date  . Depression   . GERD (gastroesophageal reflux disease)   . Thyroid disease     hypothyroidism  . Hyperlipidemia   . Endometriosis   . Osteoarthritis   . Osteopenia   . Scoliosis   . Kidney stones   . Hypothyroidism   . Hypertension   . H/O hiatal hernia     small Hital Hernia  . Psoriasis     Chest, Back and face  . Constipation   . Heel fracture   . Psoriasis     dx'd @10yrs . ago.    Past Surgical History  Procedure Laterality Date  . Lipoma excision  2012     Back  . Abdominal hysterectomy    . Cardiac catheterization  2011  . Eye surgery  4 years ago    cataract with lens- bil  . Breast lumpectomy      20 years ago- bengin  . Nephrolithotomy      for kidney stone- > 2 years.  . Lumbar laminectomy/decompression microdiscectomy  06/04/2011    Procedure: LUMBAR LAMINECTOMY/DECOMPRESSION MICRODISCECTOMY;  Surgeon: Floyce Stakes, MD;  Location: [facility];  Service: Neurosurgery;  Laterality: N/A;  Lumbar Four-Five partial Lumbar Three Laminectomy    Home Medications:  Medications Prior to Admission  Medication Sig Dispense Refill  . amLODipine (NORVASC) 5 MG tablet Take 5 mg by mouth every morning.     Marland Kitchen aspirin 81 MG tablet Take 81 mg by mouth every morning.     Marland Kitchen atorvastatin (LIPITOR) 20 MG tablet Take 20 mg by mouth every evening.     Marland Kitchen b complex vitamins tablet Take 1 tablet by mouth every morning.    . Calcium Carbonate (CALCIUM 600 PO) Take 1 tablet by mouth 2 (two) times daily.    Marland Kitchen FLUoxetine (PROZAC) 10 MG tablet Take 10 mg by mouth every morning.     Marland Kitchen levothyroxine (SYNTHROID, LEVOTHROID) 75 MCG tablet Take 75 mcg by mouth daily before breakfast.     . losartan (COZAAR) 50 MG  tablet Take 50 mg by mouth every evening.     . Multiple Vitamin (MULTIVITAMIN WITH MINERALS) TABS tablet Take 1 tablet by mouth every morning.    Marland Kitchen oxyCODONE-acetaminophen (PERCOCET) 10-325 MG per tablet Take 1 tablet by mouth every 4 (four) hours as needed for pain.    Marland Kitchen PRESCRIPTION MEDICATION Inject 1 each into the skin. Steroid shot at dr's office    . Probiotic Product (PROBIOTIC DAILY PO) Take 1 tablet by mouth every morning.    . vitamin B-12 (CYANOCOBALAMIN) 1000 MCG tablet Take 1,000 mcg by mouth every morning.    . vitamin C (ASCORBIC ACID) 500 MG tablet Take 500 mg by mouth 2 (two) times daily.      Allergies:  Allergies  Allergen Reactions  . Colace [Docusate Sodium] Rash  . Ciprofloxacin Other (See Comments)    Metallic taste  . Diclofenac Sodium Other (See Comments)    Afraid bp would spike  . Etodolac Other (See Comments)    Spiked blood pressure   . Penicillins Hives  . Relafen [Nabumetone] Other (See Comments)    Unknown  . Sulfa Drugs Cross Reactors Diarrhea  . Valacyclovir Hcl Other (See Comments)  Possible confusion    Family History  Problem Relation Age of Onset  . Anesthesia problems Mother     Social History:  reports that she has never smoked. She does not have any smokeless tobacco history on file. She reports that she does not drink alcohol or use illicit drugs.  ROS: A complete review of systems was performed.  All systems are negative except for pertinent findings as noted. She has had gross hematuria and right flank pain  Physical Exam:  Vital signs in last 24 hours: Temp:  [97.9 F (36.6 C)] 97.9 F (36.6 C) (11/05 0810) Pulse Rate:  [73] 73 (11/05 0810) Resp:  [20] 20 (11/05 0810) BP: (122)/(70) 122/70 mmHg (11/05 0810) SpO2:  [100 %] 100 % (11/05 0810) Weight:  [77.384 kg (170 lb 9.6 oz)] 77.384 kg (170 lb 9.6 oz) (11/05 0810) General:  Alert and oriented, No acute distress HEENT: Normocephalic, atraumatic Neck: No JVD or  lymphadenopathy Cardiovascular: Regular rate and rhythm Lungs: Clear bilaterally Abdomen: Soft, nontender, nondistended, no abdominal masses Back: No CVA tenderness Extremities: No edema Neurologic: Grossly intact  Laboratory Data:  No results found for this or any previous visit (from the past 24 hour(s)). No results found for this or any previous visit (from the past 240 hour(s)). Creatinine: No results for input(s): CREATININE in the last 168 hours.  Radiologic Imaging: No results found.  Impression/Assessment:  6-7 mm right UPJ stone  Plan:  Lithotripsy of right UPJ stone.  Skin to stone distance is approximately 10 cm, Hounsfield units 800.  Jorja Loa 03/09/2014, 8:36 AM  Lillette Boxer. Tannie Koskela MD

## 2014-03-09 NOTE — Discharge Instructions (Signed)
See [facility] discharge instructions in chart.  

## 2014-03-14 ENCOUNTER — Encounter (HOSPITAL_COMMUNITY)
Admission: RE | Admit: 2014-03-14 | Discharge: 2014-03-14 | Disposition: A | Discharge status: Home / Self Care | Payer: Medicare Other | Source: Ambulatory Visit | Attending: Orthopedic Surgery | Admitting: Orthopedic Surgery

## 2014-03-14 ENCOUNTER — Ambulatory Visit (HOSPITAL_COMMUNITY)
Admission: RE | Admit: 2014-03-14 | Discharge: 2014-03-14 | Disposition: A | Discharge status: Home / Self Care | Payer: Medicare Other | Source: Ambulatory Visit | Attending: Anesthesiology | Admitting: Anesthesiology

## 2014-03-14 ENCOUNTER — Encounter (HOSPITAL_COMMUNITY): Payer: Self-pay

## 2014-03-14 DIAGNOSIS — K449 Diaphragmatic hernia without obstruction or gangrene: Secondary | ICD-10-CM | POA: Insufficient documentation

## 2014-03-14 DIAGNOSIS — Z01818 Encounter for other preprocedural examination: Secondary | ICD-10-CM | POA: Insufficient documentation

## 2014-03-14 DIAGNOSIS — I10 Essential (primary) hypertension: Secondary | ICD-10-CM

## 2014-03-14 HISTORY — DX: Cardiac murmur, unspecified: R01.1

## 2014-03-14 LAB — URINALYSIS, ROUTINE W REFLEX MICROSCOPIC
BILIRUBIN URINE: NEGATIVE
Glucose, UA: NEGATIVE mg/dL
Ketones, ur: NEGATIVE mg/dL
Leukocytes, UA: NEGATIVE
Nitrite: NEGATIVE
PROTEIN: NEGATIVE mg/dL
Specific Gravity, Urine: 1.019 (ref 1.005–1.030)
UROBILINOGEN UA: 0.2 mg/dL (ref 0.0–1.0)
pH: 6 (ref 5.0–8.0)

## 2014-03-14 LAB — BASIC METABOLIC PANEL
ANION GAP: 11 (ref 5–15)
BUN: 24 mg/dL — ABNORMAL HIGH (ref 6–23)
CHLORIDE: 101 meq/L (ref 96–112)
CO2: 28 mEq/L (ref 19–32)
CREATININE: 0.94 mg/dL (ref 0.50–1.10)
Calcium: 9.6 mg/dL (ref 8.4–10.5)
GFR calc Af Amer: 65 mL/min — ABNORMAL LOW (ref >90)
GFR calc non Af Amer: 56 mL/min — ABNORMAL LOW (ref >90)
Glucose, Bld: 141 mg/dL — ABNORMAL HIGH (ref 70–99)
POTASSIUM: 4.5 meq/L (ref 3.7–5.3)
Sodium: 140 mEq/L (ref 137–147)

## 2014-03-14 LAB — CBC
HCT: 37.3 % (ref 36.0–46.0)
Hemoglobin: 11.5 g/dL — ABNORMAL LOW (ref 12.0–15.0)
MCH: 29.3 pg (ref 26.0–34.0)
MCHC: 30.8 g/dL (ref 30.0–36.0)
MCV: 95.2 fL (ref 78.0–100.0)
Platelets: 244 10*3/uL (ref 150–400)
RBC: 3.92 MIL/uL (ref 3.87–5.11)
RDW: 14.3 % (ref 11.5–15.5)
WBC: 4.9 10*3/uL (ref 4.0–10.5)

## 2014-03-14 LAB — SURGICAL PCR SCREEN
MRSA, PCR: NEGATIVE
Staphylococcus aureus: NEGATIVE

## 2014-03-14 LAB — PROTIME-INR
INR: 0.99 (ref 0.00–1.49)
PROTHROMBIN TIME: 13.2 s (ref 11.6–15.2)

## 2014-03-14 LAB — URINE MICROSCOPIC-ADD ON

## 2014-03-14 LAB — APTT: APTT: 29 s (ref 24–37)

## 2014-03-14 NOTE — Progress Notes (Signed)
EKG per epic 03/01/2014

## 2014-03-14 NOTE — Progress Notes (Signed)
Urinalysis and Micro results per PAT visit on 03/14/2014 in epic sent to Dr Alvan Dame

## 2014-03-14 NOTE — Progress Notes (Signed)
Your patient has screened at an elevated risk for Obstructive Sleep Apnea using the Stop-Bang Tool during a pre-surgical vist. A score of 4 or greater is an elevated risk. Score of 4. 

## 2014-03-14 NOTE — Progress Notes (Signed)
Clearance note per chart 03/01/2014 per Dr Irish Lack Clearance note per chart 02/14/2014 per Dr Kenton Kingfisher

## 2014-03-14 NOTE — Progress Notes (Addendum)
CBC and BMP results per PAT visit on 03/14/2014 in epic sent to Dr Alvan Dame

## 2014-03-14 NOTE — Patient Instructions (Signed)
[place]  03/14/2014   Your procedure is scheduled on:     Tuesday November 17,2015  Report to [facility] Main Entrance and follow signs to  [place] arrive at 12:55 PM.   Call this number if you have problems the morning of surgery [phone] or Presurgical Testing [phone].   Remember:  Do not eat food After Midnight but may take clear liquids until 9:55 AM day of surgery then nothing by mouth.  For Living Will and/or Health Care Power Attorney Forms: please provide copy for your medical record, may bring AM of surgery (forms should be already notarized-we do not provide this service).    For Cpap use: bring mask and tubing only.     Take these medicines the morning of surgery with A SIP OF WATER: Amlodipine;fluoxetine(Prozac);Levothyroxine;Oxycodone-Acetaminophen if needed                               You may not have any metal on your body including hair pins and piercings  Do not wear jewelry, make-up, lotions, powders, or deodorant.  Do not shave body hair  48 hours(2 days) of CHG soap use.               Do not bring valuables to the hospital. [place].  Contacts, dentures or bridgework may not be worn into surgery.  Leave suitcase in the car. After surgery it may be brought to your room.  For patients admitted to the hospital, checkout time is 11:00 AM the day of discharge.     Special Instructions: review fact sheets for MRSA information, Blood Transfusion fact sheet, Incentive Spirometry.  Remember: Type/Screen "Blue armsbands"- may not be removed once applied(would result in being retested AM of surgery, if removed). ________________________________________________________________________  [facility] - Preparing for Surgery Before surgery, you can play an important role.  Because skin is not sterile, your skin needs to be as free of germs as possible.  You can reduce the number of germs on your skin  by washing with CHG (chlorahexidine gluconate) soap before surgery.  CHG is an antiseptic cleaner which kills germs and bonds with the skin to continue killing germs even after washing. Please DO NOT use if you have an allergy to CHG or antibacterial soaps.  If your skin becomes reddened/irritated stop using the CHG and inform your nurse when you arrive at Short Stay. Do not shave (including legs and underarms) for at least 48 hours prior to the first CHG shower.  You may shave your face/neck. Please follow these instructions carefully:  1.  Shower with CHG Soap the night before surgery and the  morning of Surgery.  2.  If you choose to wash your hair, wash your hair first as usual with your  normal  shampoo.  3.  After you shampoo, rinse your hair and body thoroughly to remove the  shampoo.                           4.  Use CHG as you would any other liquid soap.  You can apply chg directly  to the skin and wash                       Gently with a scrungie or clean washcloth.  5.  Apply the CHG Soap  to your body ONLY FROM THE NECK DOWN.   Do not use on face/ open                           Wound or open sores. Avoid contact with eyes, ears mouth and genitals (private parts).                       Wash face,  Genitals (private parts) with your normal soap.             6.  Wash thoroughly, paying special attention to the area where your surgery  will be performed.  7.  Thoroughly rinse your body with warm water from the neck down.  8.  DO NOT shower/wash with your normal soap after using and rinsing off  the CHG Soap.                9.  Pat yourself dry with a clean towel.            10.  Wear clean pajamas.            11.  Place clean sheets on your bed the night of your first shower and do not  sleep with pets. Day of Surgery : Do not apply any lotions/deodorants the morning of surgery.  Please wear clean clothes to the hospital/surgery center.  FAILURE TO FOLLOW THESE INSTRUCTIONS MAY RESULT IN  THE CANCELLATION OF YOUR SURGERY PATIENT SIGNATURE_________________________________  NURSE SIGNATURE__________________________________  ________________________________________________________________________    CLEAR LIQUID DIET   Foods Allowed                                                                     Foods Excluded  Coffee and tea, regular and decaf                             liquids that you cannot  Plain Jell-O in any flavor                                             see through such as: Fruit ices (not with fruit pulp)                                     milk, soups, orange juice  Iced Popsicles                                    All solid food Carbonated beverages, regular and diet                                    Cranberry, grape and apple juices Sports drinks like Gatorade Lightly seasoned clear broth or consume(fat free) Sugar, honey syrup  Sample Menu Breakfast  Lunch                                     Supper Cranberry juice                    Beef broth                            Chicken broth Jell-O                                     Grape juice                           Apple juice Coffee or tea                        Jell-O                                      Popsicle                                                Coffee or tea                        Coffee or tea  _____________________________________________________________________    Incentive Spirometer  An incentive spirometer is a tool that can help keep your lungs clear and active. This tool measures how well you are filling your lungs with each breath. Taking long deep breaths may help reverse or decrease the chance of developing breathing (pulmonary) problems (especially infection) following:  A long period of time when you are unable to move or be active. BEFORE THE PROCEDURE   If the spirometer includes an indicator to show your best effort, your nurse  or respiratory therapist will set it to a desired goal.  If possible, sit up straight or lean slightly forward. Try not to slouch.  Hold the incentive spirometer in an upright position. INSTRUCTIONS FOR USE   Sit on the edge of your bed if possible, or sit up as far as you can in bed or on a chair.  Hold the incentive spirometer in an upright position.  Breathe out normally.  Place the mouthpiece in your mouth and seal your lips tightly around it.  Breathe in slowly and as deeply as possible, raising the piston or the ball toward the top of the column.  Hold your breath for 3-5 seconds or for as long as possible. Allow the piston or ball to fall to the bottom of the column.  Remove the mouthpiece from your mouth and breathe out normally.  Rest for a few seconds and repeat Steps 1 through 7 at least 10 times every 1-2 hours when you are awake. Take your time and take a few normal breaths between deep breaths.  The spirometer may include an indicator to show your best effort. Use the indicator as a goal to work toward during each repetition.  After each set of 10 deep breaths,  practice coughing to be sure your lungs are clear. If you have an incision (the cut made at the time of surgery), support your incision when coughing by placing a pillow or rolled up towels firmly against it. Once you are able to get out of bed, walk around indoors and cough well. You may stop using the incentive spirometer when instructed by your caregiver.  RISKS AND COMPLICATIONS  Take your time so you do not get dizzy or light-headed.  If you are in pain, you may need to take or ask for pain medication before doing incentive spirometry. It is harder to take a deep breath if you are having pain. AFTER USE  Rest and breathe slowly and easily.  It can be helpful to keep track of a log of your progress. Your caregiver can provide you with a simple table to help with this. If you are using the spirometer at  home, follow these instructions: [place] IF:   You are having difficultly using the spirometer.  You have trouble using the spirometer as often as instructed.  Your pain medication is not giving enough relief while using the spirometer.  You develop fever of 100.5 F (38.1 C) or higher. SEEK IMMEDIATE MEDICAL CARE IF:   You cough up bloody sputum that had not been present before.  You develop fever of 102 F (38.9 C) or greater.  You develop worsening pain at or near the incision site. MAKE SURE YOU:   Understand these instructions.  Will watch your condition.  Will get help right away if you are not doing well or get worse. Document Released: 09/01/2006 Document Revised: 07/14/2011 Document Reviewed: 11/02/2006 Exit[employer] Patient Information 2014 [employer], [place].   ________________________________________________________________________  WHAT IS A BLOOD TRANSFUSION? Blood Transfusion Information  A transfusion is the replacement of blood or some of its parts. Blood is made up of multiple cells which provide different functions.  Red blood cells carry oxygen and are used for blood loss replacement.  White blood cells fight against infection.  Platelets control bleeding.  Plasma helps clot blood.  Other blood products are available for specialized needs, such as hemophilia or other clotting disorders. BEFORE THE TRANSFUSION  Who gives blood for transfusions?   Healthy volunteers who are fully evaluated to make sure their blood is safe. This is blood bank blood. Transfusion therapy is the safest it has ever been in the practice of medicine. Before blood is taken from a donor, a complete history is taken to make sure that person has no history of diseases nor engages in risky social behavior (examples are intravenous drug use or sexual activity with multiple partners). The donor's travel history is screened to minimize risk of transmitting infections, such as  malaria. The donated blood is tested for signs of infectious diseases, such as HIV and hepatitis. The blood is then tested to be sure it is compatible with you in order to minimize the chance of a transfusion reaction. If you or a relative donates blood, this is often done in anticipation of surgery and is not appropriate for emergency situations. It takes many days to process the donated blood. RISKS AND COMPLICATIONS Although transfusion therapy is very safe and saves many lives, the main dangers of transfusion include:   Getting an infectious disease.  Developing a transfusion reaction. This is an allergic reaction to something in the blood you were given. Every precaution is taken to prevent this. The decision to have a blood transfusion has been  considered carefully by your caregiver before blood is given. Blood is not given unless the benefits outweigh the risks. AFTER THE TRANSFUSION  Right after receiving a blood transfusion, you will usually feel much better and more energetic. This is especially true if your red blood cells have gotten low (anemic). The transfusion raises the level of the red blood cells which carry oxygen, and this usually causes an energy increase.  The nurse administering the transfusion will monitor you carefully for complications. HOME CARE INSTRUCTIONS  No special instructions are needed after a transfusion. You may find your energy is better. Speak with your caregiver about any limitations on activity for underlying diseases you may have. SEEK MEDICAL CARE IF:   Your condition is not improving after your transfusion.  You develop redness or irritation at the intravenous (IV) site. SEEK IMMEDIATE MEDICAL CARE IF:  Any of the following symptoms occur over the next 12 hours:  Shaking chills.  You have a temperature by mouth above 102 F (38.9 C), not controlled by medicine.  Chest, back, or muscle pain.  People around you feel you are not acting correctly  or are confused.  Shortness of breath or difficulty breathing.  Dizziness and fainting.  You get a rash or develop hives.  You have a decrease in urine output.  Your urine turns a dark color or changes to pink, red, or brown. Any of the following symptoms occur over the next 10 days:  You have a temperature by mouth above 102 F (38.9 C), not controlled by medicine.  Shortness of breath.  Weakness after normal activity.  The white part of the eye turns yellow (jaundice).  You have a decrease in the amount of urine or are urinating less often.  Your urine turns a dark color or changes to pink, red, or brown. Document Released: 04/18/2000 Document Revised: 07/14/2011 Document Reviewed: 12/06/2007 [facility] Patient Information 2014 [place], [place].  _______________________________________________________________________

## 2014-03-19 NOTE — H&P (Signed)
TOTAL KNEE ADMISSION H&P  Patient is being admitted for left total knee arthroplasty.  Subjective:  Chief Complaint:   Left knee primary OA / pain.  HPI: Becky Duran, 80 y.o. female, has a history of pain and functional disability in the left knee due to arthritis and has failed non-surgical conservative treatments for greater than 12 weeks to includeNSAID's and/or analgesics, corticosteriod injections, use of assistive devices and activity modification.  Onset of symptoms was gradual, starting 1.5-2 years ago with gradually worsening course since that time. The patient noted no past surgery on the left knee(s).  Patient currently rates pain in the left knee(s) at 8 out of 10 with activity. Patient has worsening of pain with activity and weight bearing, pain that interferes with activities of daily living, pain with passive range of motion, crepitus and joint swelling.  Patient has evidence of periarticular osteophytes and joint space narrowing by imaging studies. There is no active infection.  Risks, benefits and expectations were discussed with the patient.  Risks including but not limited to the risk of anesthesia, blood clots, nerve damage, blood vessel damage, failure of the prosthesis, infection and up to and including death.  Patient understand the risks, benefits and expectations and wishes to proceed with surgery.   PCP: [name], MD  D/C Plans:      Home with HHPT  Post-op Meds:       No Rx given   Tranexamic Acid:      To be given - IV    Decadron:      Is to be given  FYI:     ASA post-op  Oxycodone and continue OxyContin  CPM - b/c of limited mobility    Patient Active Problem List   Diagnosis Date Noted  . Depression   . GERD (gastroesophageal reflux disease)   . Thyroid disease   . Hyperlipidemia   . Osteoarthritis   . Osteopenia   . Scoliosis   . Kidney stones   . Hypothyroidism   . Hypertension   . H/O hiatal hernia   . Psoriasis    Past Medical  History  Diagnosis Date  . GERD (gastroesophageal reflux disease)   . Thyroid disease     hypothyroidism  . Hyperlipidemia   . Endometriosis   . Osteoarthritis   . Osteopenia   . Scoliosis   . Hypothyroidism   . Hypertension   . H/O hiatal hernia     small Hital Hernia  . Psoriasis     Chest, Back and face  . Constipation   . Heel fracture   . Psoriasis     dx'd @10yrs . ago.  . Complication of anesthesia     pt has difficulty at night with BP spiking will experience headache occurs sporactically   . Heart murmur     related to past childhood dx pt not sure of what the disease was  . Pneumonia     hx of   . Depression     began prozac with menopause   . Kidney stones     lithotripsy 03/09/2014  . Headache     when BP spikes  . Anemia     hx of     Past Surgical History  Procedure Laterality Date  . Lipoma excision  2012     Back  . Abdominal hysterectomy    . Cardiac catheterization  2011  . Eye surgery  4 years ago    cataract with lens- bil  .  Breast lumpectomy      20 years ago- bengin  . Nephrolithotomy      for kidney stone- > 2 years.  . Lumbar laminectomy/decompression microdiscectomy  06/04/2011    Procedure: LUMBAR LAMINECTOMY/DECOMPRESSION MICRODISCECTOMY;  Surgeon: Floyce Stakes, MD;  Location: [place];  Service: Neurosurgery;  Laterality: N/A;  Lumbar Four-Five partial Lumbar Three Laminectomy  . Lithotripsy      03/09/2014  . Colonscopy      No prescriptions prior to admission   Allergies  Allergen Reactions  . Colace [Docusate Sodium] Rash  . Ciprofloxacin Other (See Comments)    Metallic taste  . Diclofenac Sodium Other (See Comments)    Afraid bp would spike  . Etodolac Other (See Comments)    Spiked blood pressure   . Penicillins Hives  . Relafen [Nabumetone] Other (See Comments)    Unknown  . Sulfa Drugs Cross Reactors Diarrhea  . Valacyclovir Hcl Other (See Comments)    Possible confusion    History  Substance Use Topics   . Smoking status: Never Smoker   . Smokeless tobacco: Never Used  . Alcohol Use: No    Family History  Problem Relation Age of Onset  . Anesthesia problems Mother      Review of Systems  Constitutional: Negative.   Eyes: Negative.   Respiratory: Negative.   Cardiovascular: Negative.   Gastrointestinal: Positive for heartburn and constipation.  Genitourinary: Negative.   Musculoskeletal: Positive for joint pain.  Skin: Negative.   Neurological: Positive for headaches.  Endo/Heme/Allergies: Negative.   Psychiatric/Behavioral: Positive for depression.    Objective:  Physical Exam  Constitutional: She is oriented to person, place, and time. She appears well-developed and well-nourished.  HENT:  Head: Normocephalic and atraumatic.  Neck: Neck supple. No JVD present. No tracheal deviation present. No thyromegaly present.  Cardiovascular: Normal rate, regular rhythm and intact distal pulses.   Murmur heard. Respiratory: Effort normal and breath sounds normal. No stridor. No respiratory distress. She has no wheezes.  GI: Soft. There is no tenderness. There is no guarding.  Musculoskeletal:       Left knee: She exhibits decreased range of motion, swelling, abnormal alignment and bony tenderness. She exhibits no ecchymosis, no deformity, no laceration and no erythema. Tenderness found.  Lymphadenopathy:    She has no cervical adenopathy.  Neurological: She is alert and oriented to person, place, and time.  Skin: Skin is warm and dry.  Psychiatric: She has a normal mood and affect.      Labs:  Estimated body mass index is 31.71 kg/(m^2) as calculated from the following:   Height as of 02/22/13: 5\' 4"  (1.626 m).   Weight as of 02/22/13: 83.825 kg (184 lb 12.8 oz).   Imaging Review Plain radiographs demonstrate severe degenerative joint disease of the left knee(s). The overall alignment is mild valgus. The bone quality appears to be good for age and reported activity  level.  Assessment/Plan:  End stage arthritis, left knee   The patient history, physical examination, clinical judgment of the provider and imaging studies are consistent with end stage degenerative joint disease of the left knee(s) and total knee arthroplasty is deemed medically necessary. The treatment options including medical management, injection therapy arthroscopy and arthroplasty were discussed at length. The risks and benefits of total knee arthroplasty were presented and reviewed. The risks due to aseptic loosening, infection, stiffness, patella tracking problems, thromboembolic complications and other imponderables were discussed. The patient acknowledged the explanation, agreed to proceed  with the plan and consent was signed. Patient is being admitted for inpatient treatment for surgery, pain control, PT, OT, prophylactic antibiotics, VTE prophylaxis, progressive ambulation and ADL's and discharge planning. The patient is planning to be discharged home with home health services.      [place] Denese Mentink   PA-C  03/19/2014, 6:40 PM

## 2014-03-21 ENCOUNTER — Inpatient Hospital Stay (HOSPITAL_COMMUNITY): Payer: Medicare Other | Admitting: Anesthesiology

## 2014-03-21 ENCOUNTER — Encounter (HOSPITAL_COMMUNITY)
Admission: RE | Disposition: A | Discharge status: Home Health | Payer: Self-pay | Source: Ambulatory Visit | Attending: Orthopedic Surgery

## 2014-03-21 ENCOUNTER — Inpatient Hospital Stay (HOSPITAL_COMMUNITY)
Admission: RE | Admit: 2014-03-21 | Discharge: 2014-03-24 | DRG: 470 | Disposition: A | Discharge status: Home Health | Payer: Medicare Other | Source: Ambulatory Visit | Attending: Orthopedic Surgery | Admitting: Orthopedic Surgery

## 2014-03-21 ENCOUNTER — Encounter (HOSPITAL_COMMUNITY): Payer: Self-pay | Admitting: *Deleted

## 2014-03-21 DIAGNOSIS — M1712 Unilateral primary osteoarthritis, left knee: Secondary | ICD-10-CM | POA: Diagnosis not present

## 2014-03-21 DIAGNOSIS — Z6830 Body mass index (BMI) 30.0-30.9, adult: Secondary | ICD-10-CM

## 2014-03-21 DIAGNOSIS — Z96659 Presence of unspecified artificial knee joint: Secondary | ICD-10-CM

## 2014-03-21 DIAGNOSIS — E039 Hypothyroidism, unspecified: Secondary | ICD-10-CM | POA: Diagnosis present

## 2014-03-21 DIAGNOSIS — Z96652 Presence of left artificial knee joint: Secondary | ICD-10-CM

## 2014-03-21 DIAGNOSIS — E669 Obesity, unspecified: Secondary | ICD-10-CM | POA: Diagnosis present

## 2014-03-21 DIAGNOSIS — Z87442 Personal history of urinary calculi: Secondary | ICD-10-CM

## 2014-03-21 DIAGNOSIS — E785 Hyperlipidemia, unspecified: Secondary | ICD-10-CM | POA: Diagnosis present

## 2014-03-21 DIAGNOSIS — M179 Osteoarthritis of knee, unspecified: Secondary | ICD-10-CM | POA: Diagnosis not present

## 2014-03-21 DIAGNOSIS — M659 Synovitis and tenosynovitis, unspecified: Secondary | ICD-10-CM | POA: Diagnosis present

## 2014-03-21 DIAGNOSIS — M25562 Pain in left knee: Secondary | ICD-10-CM | POA: Diagnosis present

## 2014-03-21 DIAGNOSIS — G8918 Other acute postprocedural pain: Secondary | ICD-10-CM | POA: Diagnosis not present

## 2014-03-21 DIAGNOSIS — I10 Essential (primary) hypertension: Secondary | ICD-10-CM | POA: Diagnosis present

## 2014-03-21 HISTORY — PX: TOTAL KNEE ARTHROPLASTY: SHX125

## 2014-03-21 LAB — TYPE AND SCREEN
ABO/RH(D): O POS
Antibody Screen: NEGATIVE

## 2014-03-21 SURGERY — ARTHROPLASTY, KNEE, TOTAL
Anesthesia: General | Laterality: Left

## 2014-03-21 MED ORDER — EPHEDRINE SULFATE 50 MG/ML IJ SOLN
INTRAMUSCULAR | Status: DC | PRN
  Administered 2014-03-21: 10 mg via INTRAVENOUS
  Administered 2014-03-21: 5 mg via INTRAVENOUS

## 2014-03-21 MED ORDER — ALUM & MAG HYDROXIDE-SIMETH 200-200-20 MG/5ML PO SUSP: 30.0000 mL | ORAL | Status: DC | PRN

## 2014-03-21 MED ORDER — EPHEDRINE SULFATE 50 MG/ML IJ SOLN
INTRAMUSCULAR | Status: AC
  Filled 2014-03-21: qty 1

## 2014-03-21 MED ORDER — DIPHENHYDRAMINE HCL 25 MG PO CAPS: 25.0000 mg | ORAL_CAPSULE | Freq: Four times a day (QID) | ORAL | Status: DC | PRN

## 2014-03-21 MED ORDER — NEOSTIGMINE METHYLSULFATE 10 MG/10ML IV SOLN
INTRAVENOUS | Status: AC
  Filled 2014-03-21: qty 1

## 2014-03-21 MED ORDER — ROPIVACAINE HCL 5 MG/ML IJ SOLN
INTRAMUSCULAR | Status: AC
  Filled 2014-03-21: qty 30

## 2014-03-21 MED ORDER — SODIUM CHLORIDE 0.9 % IJ SOLN
INTRAMUSCULAR | Status: AC
  Filled 2014-03-21: qty 50

## 2014-03-21 MED ORDER — METOCLOPRAMIDE HCL 5 MG/ML IJ SOLN: 5.0000 mg | Freq: Three times a day (TID) | INTRAMUSCULAR | Status: DC | PRN

## 2014-03-21 MED ORDER — HYDROMORPHONE HCL 1 MG/ML IJ SOLN
INTRAMUSCULAR | Status: AC
  Filled 2014-03-21: qty 1

## 2014-03-21 MED ORDER — ROCURONIUM BROMIDE 100 MG/10ML IV SOLN
INTRAVENOUS | Status: DC | PRN
  Administered 2014-03-21: 30 mg via INTRAVENOUS

## 2014-03-21 MED ORDER — BUPIVACAINE-EPINEPHRINE (PF) 0.25% -1:200000 IJ SOLN
INTRAMUSCULAR | Status: AC
  Filled 2014-03-21: qty 30

## 2014-03-21 MED ORDER — FLUOXETINE HCL 20 MG PO TABS
10.0000 mg | ORAL_TABLET | Freq: Every morning | ORAL | Status: DC
  Filled 2014-03-21: qty 1

## 2014-03-21 MED ORDER — LACTATED RINGERS IV SOLN
INTRAVENOUS | Status: DC
  Administered 2014-03-21 (×2): via INTRAVENOUS
  Administered 2014-03-21: 1000 mL via INTRAVENOUS

## 2014-03-21 MED ORDER — AMLODIPINE BESYLATE 5 MG PO TABS
5.0000 mg | ORAL_TABLET | Freq: Every day | ORAL | Status: DC
  Administered 2014-03-22 – 2014-03-24 (×3): 5 mg via ORAL
  Filled 2014-03-21 (×3): qty 1

## 2014-03-21 MED ORDER — MAGNESIUM CITRATE PO SOLN: 1.0000 | Freq: Once | ORAL | Status: AC | PRN

## 2014-03-21 MED ORDER — FENTANYL CITRATE 0.05 MG/ML IJ SOLN
INTRAMUSCULAR | Status: AC
  Filled 2014-03-21: qty 5

## 2014-03-21 MED ORDER — MIDAZOLAM HCL 5 MG/5ML IJ SOLN
INTRAMUSCULAR | Status: DC | PRN
  Administered 2014-03-21 (×2): 0.5 mg via INTRAVENOUS
  Administered 2014-03-21: 1 mg via INTRAVENOUS

## 2014-03-21 MED ORDER — BISACODYL 10 MG RE SUPP: 10.0000 mg | Freq: Every day | RECTAL | Status: DC | PRN

## 2014-03-21 MED ORDER — PROPOFOL 10 MG/ML IV BOLUS
INTRAVENOUS | Status: DC | PRN
  Administered 2014-03-21: 150 mg via INTRAVENOUS

## 2014-03-21 MED ORDER — HYDROMORPHONE HCL 2 MG/ML IJ SOLN
INTRAMUSCULAR | Status: AC
  Filled 2014-03-21: qty 1

## 2014-03-21 MED ORDER — 0.9 % SODIUM CHLORIDE (POUR BTL) OPTIME
TOPICAL | Status: DC | PRN
  Administered 2014-03-21: 1000 mL

## 2014-03-21 MED ORDER — ATORVASTATIN CALCIUM 20 MG PO TABS
20.0000 mg | ORAL_TABLET | Freq: Every evening | ORAL | Status: DC
  Administered 2014-03-21 – 2014-03-23 (×3): 20 mg via ORAL
  Filled 2014-03-21 (×4): qty 1

## 2014-03-21 MED ORDER — ASPIRIN EC 325 MG PO TBEC
325.0000 mg | DELAYED_RELEASE_TABLET | Freq: Two times a day (BID) | ORAL | Status: DC
  Administered 2014-03-22 – 2014-03-24 (×5): 325 mg via ORAL
  Filled 2014-03-21 (×7): qty 1

## 2014-03-21 MED ORDER — SUCCINYLCHOLINE CHLORIDE 20 MG/ML IJ SOLN
INTRAMUSCULAR | Status: DC | PRN
  Administered 2014-03-21: 100 mg via INTRAVENOUS

## 2014-03-21 MED ORDER — NEOSTIGMINE METHYLSULFATE 10 MG/10ML IV SOLN
INTRAVENOUS | Status: DC | PRN
  Administered 2014-03-21: 1 mg via INTRAVENOUS

## 2014-03-21 MED ORDER — HYDROMORPHONE HCL 1 MG/ML IJ SOLN
0.5000 mg | INTRAMUSCULAR | Status: DC | PRN
  Administered 2014-03-22 – 2014-03-23 (×3): 1 mg via INTRAVENOUS
  Administered 2014-03-23: 2 mg via INTRAVENOUS
  Filled 2014-03-21: qty 1
  Filled 2014-03-21: qty 2
  Filled 2014-03-21 (×2): qty 1

## 2014-03-21 MED ORDER — ONDANSETRON HCL 4 MG/2ML IJ SOLN: 4.0000 mg | Freq: Four times a day (QID) | INTRAMUSCULAR | Status: DC | PRN

## 2014-03-21 MED ORDER — SODIUM CHLORIDE 0.9 % IJ SOLN
INTRAMUSCULAR | Status: AC
  Filled 2014-03-21: qty 10

## 2014-03-21 MED ORDER — LEVOTHYROXINE SODIUM 75 MCG PO TABS
75.0000 ug | ORAL_TABLET | Freq: Every day | ORAL | Status: DC
  Administered 2014-03-22 – 2014-03-24 (×3): 75 ug via ORAL
  Filled 2014-03-21 (×4): qty 1

## 2014-03-21 MED ORDER — METHOCARBAMOL 500 MG PO TABS
500.0000 mg | ORAL_TABLET | Freq: Four times a day (QID) | ORAL | Status: DC | PRN
  Administered 2014-03-22 – 2014-03-24 (×6): 500 mg via ORAL
  Filled 2014-03-21 (×6): qty 1

## 2014-03-21 MED ORDER — CLINDAMYCIN PHOSPHATE 900 MG/50ML IV SOLN
INTRAVENOUS | Status: AC
  Filled 2014-03-21: qty 50

## 2014-03-21 MED ORDER — HYDROMORPHONE HCL 1 MG/ML IJ SOLN
INTRAMUSCULAR | Status: DC | PRN
  Administered 2014-03-21 (×2): 1 mg via INTRAVENOUS

## 2014-03-21 MED ORDER — CHLORHEXIDINE GLUCONATE 4 % EX LIQD: 60.0000 mL | Freq: Once | CUTANEOUS | Status: DC

## 2014-03-21 MED ORDER — LOSARTAN POTASSIUM 50 MG PO TABS
50.0000 mg | ORAL_TABLET | Freq: Every evening | ORAL | Status: DC
  Administered 2014-03-21 – 2014-03-23 (×3): 50 mg via ORAL
  Filled 2014-03-21 (×4): qty 1

## 2014-03-21 MED ORDER — KETOROLAC TROMETHAMINE 30 MG/ML IJ SOLN
INTRAMUSCULAR | Status: DC | PRN
  Administered 2014-03-21: 30 mg via INTRAVENOUS

## 2014-03-21 MED ORDER — ACETAMINOPHEN 325 MG PO TABS: 650.0000 mg | ORAL_TABLET | Freq: Four times a day (QID) | ORAL | Status: DC | PRN

## 2014-03-21 MED ORDER — METHOCARBAMOL 1000 MG/10ML IJ SOLN
500.0000 mg | Freq: Four times a day (QID) | INTRAVENOUS | Status: DC | PRN
  Administered 2014-03-21: 500 mg via INTRAVENOUS
  Filled 2014-03-21 (×2): qty 5

## 2014-03-21 MED ORDER — BUPIVACAINE-EPINEPHRINE (PF) 0.25% -1:200000 IJ SOLN
INTRAMUSCULAR | Status: DC | PRN
  Administered 2014-03-21: 30 mL

## 2014-03-21 MED ORDER — OXYCODONE HCL 5 MG PO TABS
10.0000 mg | ORAL_TABLET | ORAL | Status: DC
  Administered 2014-03-21 – 2014-03-22 (×2): 10 mg via ORAL
  Administered 2014-03-22 (×5): 15 mg via ORAL
  Administered 2014-03-23 (×5): 20 mg via ORAL
  Administered 2014-03-23: 15 mg via ORAL
  Administered 2014-03-24 (×2): 20 mg via ORAL
  Administered 2014-03-24: 15 mg via ORAL
  Administered 2014-03-24: 20 mg via ORAL
  Filled 2014-03-21 (×5): qty 4
  Filled 2014-03-21: qty 2
  Filled 2014-03-21 (×4): qty 3
  Filled 2014-03-21: qty 4
  Filled 2014-03-21: qty 3
  Filled 2014-03-21: qty 4
  Filled 2014-03-21: qty 2
  Filled 2014-03-21 (×3): qty 4

## 2014-03-21 MED ORDER — CLINDAMYCIN PHOSPHATE 900 MG/50ML IV SOLN
900.0000 mg | INTRAVENOUS | Status: AC
  Administered 2014-03-21: 900 mg via INTRAVENOUS

## 2014-03-21 MED ORDER — ACETAMINOPHEN 10 MG/ML IV SOLN
1000.0000 mg | Freq: Once | INTRAVENOUS | Status: AC
  Administered 2014-03-21: 1000 mg via INTRAVENOUS
  Filled 2014-03-21: qty 100

## 2014-03-21 MED ORDER — PROPOFOL 10 MG/ML IV BOLUS
INTRAVENOUS | Status: AC
  Filled 2014-03-21: qty 20

## 2014-03-21 MED ORDER — ROCURONIUM BROMIDE 100 MG/10ML IV SOLN
INTRAVENOUS | Status: AC
  Filled 2014-03-21: qty 1

## 2014-03-21 MED ORDER — METOCLOPRAMIDE HCL 10 MG PO TABS
5.0000 mg | ORAL_TABLET | Freq: Three times a day (TID) | ORAL | Status: DC | PRN
Start: 2014-03-21 — End: 2014-03-24

## 2014-03-21 MED ORDER — POLYETHYLENE GLYCOL 3350 17 G PO PACK
17.0000 g | PACK | Freq: Two times a day (BID) | ORAL | Status: DC
  Administered 2014-03-21 – 2014-03-24 (×6): 17 g via ORAL

## 2014-03-21 MED ORDER — ONDANSETRON HCL 4 MG/2ML IJ SOLN
INTRAMUSCULAR | Status: DC | PRN
  Administered 2014-03-21: 4 mg via INTRAVENOUS

## 2014-03-21 MED ORDER — MIDAZOLAM HCL 2 MG/2ML IJ SOLN
INTRAMUSCULAR | Status: AC
  Filled 2014-03-21: qty 2

## 2014-03-21 MED ORDER — LIDOCAINE HCL (CARDIAC) 20 MG/ML IV SOLN
INTRAVENOUS | Status: AC
  Filled 2014-03-21: qty 5

## 2014-03-21 MED ORDER — FERROUS SULFATE 325 (65 FE) MG PO TABS
325.0000 mg | ORAL_TABLET | Freq: Three times a day (TID) | ORAL | Status: DC
  Administered 2014-03-22 – 2014-03-24 (×8): 325 mg via ORAL
  Filled 2014-03-21 (×10): qty 1

## 2014-03-21 MED ORDER — GLYCOPYRROLATE 0.2 MG/ML IJ SOLN
INTRAMUSCULAR | Status: AC
  Filled 2014-03-21: qty 1

## 2014-03-21 MED ORDER — ACETAMINOPHEN 650 MG RE SUPP: 650.0000 mg | Freq: Four times a day (QID) | RECTAL | Status: DC | PRN

## 2014-03-21 MED ORDER — SODIUM CHLORIDE 0.9 % IJ SOLN
INTRAMUSCULAR | Status: DC | PRN
  Administered 2014-03-21: 30 mL via INTRAVENOUS

## 2014-03-21 MED ORDER — PANTOPRAZOLE SODIUM 40 MG PO TBEC
40.0000 mg | DELAYED_RELEASE_TABLET | Freq: Every day | ORAL | Status: DC
  Administered 2014-03-22 – 2014-03-24 (×3): 40 mg via ORAL
  Filled 2014-03-21 (×3): qty 1

## 2014-03-21 MED ORDER — DEXAMETHASONE SODIUM PHOSPHATE 10 MG/ML IJ SOLN
10.0000 mg | Freq: Once | INTRAMUSCULAR | Status: AC
  Administered 2014-03-21: 10 mg via INTRAVENOUS

## 2014-03-21 MED ORDER — LIDOCAINE HCL (CARDIAC) 20 MG/ML IV SOLN
INTRAVENOUS | Status: DC | PRN
  Administered 2014-03-21: 100 mg via INTRAVENOUS

## 2014-03-21 MED ORDER — PHENOL 1.4 % MT LIQD
1.0000 | OROMUCOSAL | Status: DC | PRN
  Filled 2014-03-21: qty 177

## 2014-03-21 MED ORDER — SODIUM CHLORIDE 0.9 % IV SOLN
INTRAVENOUS | Status: DC
  Administered 2014-03-21: 22:00:00 via INTRAVENOUS
  Filled 2014-03-21 (×11): qty 1000

## 2014-03-21 MED ORDER — ONDANSETRON HCL 4 MG/2ML IJ SOLN
INTRAMUSCULAR | Status: AC
  Filled 2014-03-21: qty 2

## 2014-03-21 MED ORDER — FENTANYL CITRATE 0.05 MG/ML IJ SOLN
INTRAMUSCULAR | Status: DC | PRN
  Administered 2014-03-21 (×2): 25 ug via INTRAVENOUS
  Administered 2014-03-21: 50 ug via INTRAVENOUS
  Administered 2014-03-21: 100 ug via INTRAVENOUS
  Administered 2014-03-21 (×2): 25 ug via INTRAVENOUS

## 2014-03-21 MED ORDER — KETOROLAC TROMETHAMINE 30 MG/ML IJ SOLN
INTRAMUSCULAR | Status: AC
  Filled 2014-03-21: qty 1

## 2014-03-21 MED ORDER — DEXAMETHASONE SODIUM PHOSPHATE 10 MG/ML IJ SOLN
INTRAMUSCULAR | Status: AC
  Filled 2014-03-21: qty 1

## 2014-03-21 MED ORDER — TRANEXAMIC ACID 100 MG/ML IV SOLN
1000.0000 mg | Freq: Once | INTRAVENOUS | Status: AC
  Administered 2014-03-21: 1000 mg via INTRAVENOUS
  Filled 2014-03-21: qty 10

## 2014-03-21 MED ORDER — MENTHOL 3 MG MT LOZG
1.0000 | LOZENGE | OROMUCOSAL | Status: DC | PRN
Start: 2014-03-21 — End: 2014-03-24
  Filled 2014-03-21: qty 9

## 2014-03-21 MED ORDER — HYDROMORPHONE HCL 1 MG/ML IJ SOLN
0.2500 mg | INTRAMUSCULAR | Status: DC | PRN
  Administered 2014-03-21 (×4): 0.5 mg via INTRAVENOUS

## 2014-03-21 MED ORDER — DEXAMETHASONE SODIUM PHOSPHATE 10 MG/ML IJ SOLN
10.0000 mg | Freq: Once | INTRAMUSCULAR | Status: AC
  Administered 2014-03-22: 10 mg via INTRAVENOUS
  Filled 2014-03-21: qty 1

## 2014-03-21 MED ORDER — ONDANSETRON HCL 4 MG PO TABS: 4.0000 mg | ORAL_TABLET | Freq: Four times a day (QID) | ORAL | Status: DC | PRN

## 2014-03-21 MED ORDER — SODIUM CHLORIDE 0.9 % IR SOLN
Status: DC | PRN
  Administered 2014-03-21: 1000 mL

## 2014-03-21 MED ORDER — CLINDAMYCIN PHOSPHATE 600 MG/50ML IV SOLN
600.0000 mg | Freq: Four times a day (QID) | INTRAVENOUS | Status: AC
  Administered 2014-03-21 – 2014-03-22 (×2): 600 mg via INTRAVENOUS
  Filled 2014-03-21 (×2): qty 50

## 2014-03-21 MED ORDER — GLYCOPYRROLATE 0.2 MG/ML IJ SOLN
INTRAMUSCULAR | Status: DC | PRN
  Administered 2014-03-21: .2 mg via INTRAVENOUS

## 2014-03-21 MED ORDER — PROMETHAZINE HCL 25 MG/ML IJ SOLN: 6.2500 mg | INTRAMUSCULAR | Status: DC | PRN

## 2014-03-21 SURGICAL SUPPLY — 51 items
BAG SPEC THK2 15X12 ZIP CLS (MISCELLANEOUS)
BAG ZIPLOCK 12X15 (MISCELLANEOUS) IMPLANT
BANDAGE ELASTIC 6 VELCRO ST LF (GAUZE/BANDAGES/DRESSINGS) ×2 IMPLANT
BANDAGE ESMARK 6X9 LF (GAUZE/BANDAGES/DRESSINGS) ×1 IMPLANT
BLADE SAW SGTL 13.0X1.19X90.0M (BLADE) ×2 IMPLANT
BNDG CMPR 9X6 STRL LF SNTH (GAUZE/BANDAGES/DRESSINGS) ×1
BNDG ESMARK 6X9 LF (GAUZE/BANDAGES/DRESSINGS) ×2
BOWL SMART MIX CTS (DISPOSABLE) ×2 IMPLANT
CAPT RP KNEE ×2 IMPLANT
CEMENT HV SMART SET (Cement) ×4 IMPLANT
CUFF TOURN SGL QUICK 34 (TOURNIQUET CUFF) ×2
CUFF TRNQT CYL 34X4X40X1 (TOURNIQUET CUFF) ×1 IMPLANT
DECANTER SPIKE VIAL GLASS SM (MISCELLANEOUS) ×2 IMPLANT
DRAPE EXTREMITY TIBURON (DRAPES) ×2 IMPLANT
DRAPE POUCH INSTRU U-SHP 10X18 (DRAPES) ×2 IMPLANT
DRAPE U-SHAPE 47X51 STRL (DRAPES) ×2 IMPLANT
DRSG AQUACEL AG ADV 3.5X10 (GAUZE/BANDAGES/DRESSINGS) ×2 IMPLANT
DURAPREP 26ML APPLICATOR (WOUND CARE) ×4 IMPLANT
ELECT REM PT RETURN 9FT ADLT (ELECTROSURGICAL) ×2
ELECTRODE REM PT RTRN 9FT ADLT (ELECTROSURGICAL) ×1 IMPLANT
FACESHIELD WRAPAROUND (MASK) ×10 IMPLANT
GLOVE BIOGEL PI IND STRL 7.5 (GLOVE) ×1 IMPLANT
GLOVE BIOGEL PI IND STRL 8.5 (GLOVE) ×1 IMPLANT
GLOVE BIOGEL PI INDICATOR 7.5 (GLOVE) ×1
GLOVE BIOGEL PI INDICATOR 8.5 (GLOVE) ×1
GLOVE ECLIPSE 8.0 STRL XLNG CF (GLOVE) ×2 IMPLANT
GLOVE ORTHO TXT STRL SZ7.5 (GLOVE) ×4 IMPLANT
GOWN SPEC L3 XXLG W/TWL (GOWN DISPOSABLE) ×2 IMPLANT
GOWN STRL REUS W/TWL LRG LVL3 (GOWN DISPOSABLE) ×2 IMPLANT
HANDPIECE INTERPULSE COAX TIP (DISPOSABLE) ×2
KIT BASIN OR (CUSTOM PROCEDURE TRAY) ×2 IMPLANT
LIQUID BAND (GAUZE/BANDAGES/DRESSINGS) ×2 IMPLANT
MANIFOLD NEPTUNE II (INSTRUMENTS) ×2 IMPLANT
NDL SAFETY ECLIPSE 18X1.5 (NEEDLE) ×1 IMPLANT
NEEDLE HYPO 18GX1.5 SHARP (NEEDLE) ×2
PACK TOTAL JOINT (CUSTOM PROCEDURE TRAY) ×2 IMPLANT
POSITIONER SURGICAL ARM (MISCELLANEOUS) ×2 IMPLANT
SET HNDPC FAN SPRY TIP SCT (DISPOSABLE) ×1 IMPLANT
SET PAD KNEE POSITIONER (MISCELLANEOUS) ×2 IMPLANT
SUCTION FRAZIER 12FR DISP (SUCTIONS) ×2 IMPLANT
SUT MNCRL AB 4-0 PS2 18 (SUTURE) ×2 IMPLANT
SUT VIC AB 1 CT1 36 (SUTURE) ×2 IMPLANT
SUT VIC AB 2-0 CT1 27 (SUTURE) ×6
SUT VIC AB 2-0 CT1 TAPERPNT 27 (SUTURE) ×3 IMPLANT
SUT VLOC 180 0 24IN GS25 (SUTURE) ×2 IMPLANT
SYR 50ML LL SCALE MARK (SYRINGE) ×2 IMPLANT
TOWEL OR 17X26 10 PK STRL BLUE (TOWEL DISPOSABLE) ×2 IMPLANT
TOWEL OR NON WOVEN STRL DISP B (DISPOSABLE) IMPLANT
TRAY FOLEY CATH 14FRSI W/METER (CATHETERS) ×2 IMPLANT
WATER STERILE IRR 1500ML POUR (IV SOLUTION) ×2 IMPLANT
WRAP KNEE MAXI GEL POST OP (GAUZE/BANDAGES/DRESSINGS) ×2 IMPLANT

## 2014-03-21 NOTE — Transfer of Care (Signed)
Immediate Anesthesia Transfer of Care Note  Patient: Becky Duran  Procedure(s) Performed: Procedure(s) with comments: TOTAL KNEE ARTHROPLASTY (Left) - left femoral nerve block  Patient Location: PACU  Anesthesia Type:General  Level of Consciousness: awake, alert , pateint uncooperative, confused and responds to stimulation  Airway & Oxygen Therapy: Patient Spontanous Breathing and Patient connected to face mask oxygen  Post-op Assessment: Report given to PACU RN, Post -op Vital signs reviewed and stable and Patient moving all extremities  Post vital signs: Reviewed and stable  Complications: No apparent anesthesia complications

## 2014-03-21 NOTE — Progress Notes (Signed)
AssistedDr. Denneny} with left femoral} block. Side rails up, monitors on throughout procedure. See vital signs in flow sheet. Tolerated Procedure well.

## 2014-03-21 NOTE — Anesthesia Preprocedure Evaluation (Addendum)
Anesthesia Evaluation  Patient identified by MRN, date of birth, ID band Patient awake    Reviewed: Allergy & Precautions, H&P , NPO status , Patient's Chart, lab work & pertinent test results  History of Anesthesia Complications (+) history of anesthetic complications  Airway Mallampati: II  TM Distance: >3 FB Neck ROM: Full    Dental no notable dental hx.    Pulmonary pneumonia -,  breath sounds clear to auscultation  Pulmonary exam normal       Cardiovascular hypertension, Pt. on medications + Valvular Problems/Murmurs Rhythm:Regular Rate:Normal     Neuro/Psych  Headaches, PSYCHIATRIC DISORDERS Depression    GI/Hepatic Neg liver ROS, hiatal hernia, GERD-  ,  Endo/Other  Hypothyroidism   Renal/GU Renal disease  negative genitourinary   Musculoskeletal  (+) Arthritis -,   Abdominal   Peds negative pediatric ROS (+)  Hematology  (+) anemia ,   Anesthesia Other Findings   Reproductive/Obstetrics negative OB ROS                           Anesthesia Physical Anesthesia Plan  ASA: II  Anesthesia Plan: General   Post-op Pain Management:    Induction: Intravenous  Airway Management Planned: Oral ETT  Additional Equipment:   Intra-op Plan:   Post-operative Plan: Extubation in OR  Informed Consent: I have reviewed the patients History and Physical, chart, labs and discussed the procedure including the risks, benefits and alternatives for the proposed anesthesia with the patient or authorized representative who has indicated his/her understanding and acceptance.   Dental advisory given  Plan Discussed with: CRNA  Anesthesia Plan Comments: (Discussed spinal and general with femoral nerve block. Chronic back pain. She prefers general with FNB. Discussed risks of femoral nerve block including failure, bleeding, infection, nerve damage.  Femoral nerve block does not usually prevent all  pain. Specifically, it treats the anterior, but often not the posterior knee. Questions answered.  Patient consents to block.)      Anesthesia Quick Evaluation

## 2014-03-21 NOTE — Plan of Care (Signed)
Problem: Consults Goal: Total Joint Replacement Patient Education See Patient Education Module for education specifics. Outcome: Completed/Met Date Met:  03/21/14 Goal: Diagnosis- Total Joint Replacement Outcome: Completed/Met Date Met:  03/21/14 Primary Total Knee LEFT Goal: Skin Care Protocol Initiated - if Braden Score 18 or less If consults are not indicated, leave blank or document N/A Outcome: Not Applicable Date Met:  13/08/65 Goal: Nutrition Consult-if indicated Outcome: Not Applicable Date Met:  78/46/96 Goal: Diabetes Guidelines if Diabetic/Glucose > 140 If diabetic or lab glucose is > 140 mg/dl - Initiate Diabetes/Hyperglycemia Guidelines & Document Interventions  Outcome: Not Applicable Date Met:  29/52/84  Problem: Phase I Progression Outcomes Goal: CMS/Neurovascular status WDL Outcome: Completed/Met Date Met:  03/21/14

## 2014-03-21 NOTE — Anesthesia Postprocedure Evaluation (Signed)
  Anesthesia Post-op Note  Patient: Becky Duran  Procedure(s) Performed: Procedure(s) (LRB): TOTAL KNEE ARTHROPLASTY (Left)  Patient Location: PACU  Anesthesia Type: General and GA combined with regional for post-op pain  Level of Consciousness: awake and alert   Airway and Oxygen Therapy: Patient Spontanous Breathing  Post-op Pain: mild  Post-op Assessment: Post-op Vital signs reviewed, Patient's Cardiovascular Status Stable, Respiratory Function Stable, Patent Airway and No signs of Nausea or vomiting  Last Vitals:  Filed Vitals:   03/21/14 1842  BP: 147/84  Pulse: 102  Temp: 36.6 C  Resp: 16    Post-op Vital Signs: stable   Complications: No apparent anesthesia complications

## 2014-03-21 NOTE — Interval H&P Note (Signed)
History and Physical Interval Note:  03/21/2014 2:15 PM  Becky Duran  has presented today for surgery, with the diagnosis of Left knee OA  The various methods of treatment have been discussed with the patient and family. After consideration of risks, benefits and other options for treatment, the patient has consented to  Procedure(s): TOTAL KNEE ARTHROPLASTY (Left) as a surgical intervention .  The patient's history has been reviewed, patient examined, no change in status, stable for surgery.  I have reviewed the patient's chart and labs.  Questions were answered to the patient's satisfaction.     Mauri Pole

## 2014-03-21 NOTE — Op Note (Signed)
NAME:  [place] RECORD NO.:  [id number]                             FACILITY:  [facility]      PHYSICIAN:  Pietro Cassis. Alvan Dame, M.D.  DATE OF BIRTH:  [birth year]      DATE OF PROCEDURE:  03/21/2014                                     OPERATIVE REPORT         PREOPERATIVE DIAGNOSIS:  Left knee primary osteoarthritis.      POSTOPERATIVE DIAGNOSIS:  Left knee primary osteoarthritis.      FINDINGS:  The patient was noted to have complete loss of cartilage and   bone-on-bone arthritis with associated osteophytes in the lateral and patellofemoral compartments of   the knee with a significant synovitis and associated effusion.      PROCEDURE:  Left total knee replacement.      COMPONENTS USED:  DePuy rotating platform posterior stabilized knee   system, a size 2.5 femur, 2.5 tibia, 10 mm PS insert, and 35 patellar   button.      SURGEON:  Pietro Cassis. Alvan Dame, M.D.      ASSISTANT:  Danae Orleans, PA-C.      ANESTHESIA:  General and Regional.      SPECIMENS:  None.      COMPLICATION:  None.      DRAINS:  None.  EBL: <100cc      TOURNIQUET TIME:   Total Tourniquet Time Documented: Thigh (Left) - 30 minutes Total: Thigh (Left) - 30 minutes     The patient was stable to the recovery room.      INDICATION FOR PROCEDURE:  Becky Duran is a 80 y.o. female patient of   mine.  The patient had been seen, evaluated, and treated conservatively in the   office with medication, activity modification, and injections.  The patient had   radiographic changes of bone-on-bone arthritis with endplate sclerosis and osteophytes noted.      The patient failed conservative measures including medication, injections, and activity modification, and at this point was ready for more definitive measures.   Based on the radiographic changes and failed conservative measures, the patient   decided to proceed with total knee replacement.  Risks of infection,   DVT, component  failure, need for revision surgery, postop course, and   expectations were all   discussed and reviewed.  Consent was obtained for benefit of pain   relief.      PROCEDURE IN DETAIL:  The patient was brought to the operative theater.   Once adequate anesthesia, preoperative antibiotics, 2 gm of Ancef administered, the patient was positioned supine with the left thigh tourniquet placed.  The  left lower extremity was prepped and draped in sterile fashion.  A time-   out was performed identifying the patient, planned procedure, and   extremity.      The left lower extremity was placed in the [facility] leg holder.  The leg was   exsanguinated, tourniquet elevated to 250 mmHg.  A midline incision was   made followed by median parapatellar arthrotomy.  Following initial   exposure, attention was  first directed to the patella.  Precut   measurement was noted to be 22 mm.  I resected down to 14 mm and used a   35 patellar button to restore patellar height as well as cover the cut   surface.      The lug holes were drilled and a metal shim was placed to protect the   patella from retractors and saw blades.      At this point, attention was now directed to the femur.  The femoral   canal was opened with a drill, irrigated to try to prevent fat emboli.  An   intramedullary rod was passed at 3 degrees valgus, 10 mm of bone was   resected off the distal femur.  Following this resection, the tibia was   subluxated anteriorly.  Using the extramedullary guide, 4 mm of bone was resected off   the proximal medial tibia.  We confirmed the gap would be   stable medially and laterally with a 10 mm insert as well as confirmed   the cut was perpendicular in the coronal plane, checking with an alignment rod.      Once this was done, I sized the femur to be a size 2.5 in the anterior-   posterior dimension, chose a standard component based on medial and   lateral dimension.  The size 2.5 rotation block was  then pinned in   position anterior referenced using the C-clamp to set rotation.  The   anterior, posterior, and  chamfer cuts were made without difficulty nor   notching making certain that I was along the anterior cortex to help   with flexion gap stability.      The final box cut was made off the lateral aspect of distal femur.      At this point, the tibia was sized to be a size 2.5, the size 2.5 tray was   then pinned in position through the medial third of the tubercle,   drilled, and keel punched.  Trial reduction was now carried with a 2.5 femur,  2.5 tibia, a 10 mm PS insert, and the 35 patella botton.  The knee was brought to   extension, full extension with good flexion stability with the patella   tracking through the trochlea without application of pressure.  Given   all these findings, the trial components removed.  Final components were   opened and cement was mixed.  The knee was irrigated with normal saline   solution and pulse lavage.  The synovial lining was   then injected with 30cc of 0.25% Marcaine with epinephrine and 1 cc of Toradol plus 30cc of NS for a total of 61cc.     The knee was irrigated.  Final implants were then cemented onto clean and   dried cut surfaces of bone with the knee brought to extension with a 10 mm trial insert.      Once the cement had fully cured, the excess cement was removed   throughout the knee.  I confirmed I was satisfied with the range of   motion and stability, and the final 10 mm PS insert was chosen.  It was   placed into the knee.      The tourniquet had been let down at 30 minutes.  No significant   hemostasis required.  The   extensor mechanism was then reapproximated using #1 Vicryl and #0 V-lock sutures with the knee   in flexion.  The  remaining wound was closed with 2-0 Vicryl and running 4-0 Monocryl.   The knee was cleaned, dried, dressed sterilely using Dermabond and   Aquacel dressing.  The patient was then    brought to recovery room in stable condition, tolerating the procedure   well.   Please note that Physician Assistant, Danae Orleans, PA-C, was present for the entirety of the case, and was utilized for pre-operative positioning, peri-operative retractor management, general facilitation of the procedure.  He was also utilized for primary wound closure at the end of the case.              Pietro Cassis Alvan Dame, M.D.    03/21/2014 4:56 PM

## 2014-03-21 NOTE — Anesthesia Procedure Notes (Signed)
Anesthesia Regional Block:  Femoral nerve block  Pre-Anesthetic Checklist: ,, timeout performed, Correct Patient, Correct Site, Correct Laterality, Correct Procedure, Correct Position, site marked, Risks and benefits discussed,  Surgical consent,  Pre-op evaluation,  At surgeon's request and post-op pain management  Laterality: Lower and Left  Prep: chloraprep       Needles:  Injection technique: Single-shot  Needle Type: Stimiplex      Needle Gauge: 21 and 21 G    Additional Needles:  Procedures: ultrasound guided (picture in chart) and nerve stimulator Femoral nerve block  Nerve Stimulator or Paresthesia:  Response: quadriceps, 0.6 mA,   Additional Responses:   Narrative:   Performed by: Personally   Additional Notes: No pain on injection. No increased resistance to injection. Motor intact immediately after block. Loss of quadriceps strength at 20 minutes. Meaningful verbal contact maintained throughout placement of block.

## 2014-03-22 ENCOUNTER — Encounter (HOSPITAL_COMMUNITY): Payer: Self-pay | Admitting: Orthopedic Surgery

## 2014-03-22 DIAGNOSIS — E669 Obesity, unspecified: Secondary | ICD-10-CM | POA: Diagnosis present

## 2014-03-22 LAB — CBC
HEMATOCRIT: 30.4 % — AB (ref 36.0–46.0)
HEMOGLOBIN: 9.8 g/dL — AB (ref 12.0–15.0)
MCH: 30.5 pg (ref 26.0–34.0)
MCHC: 32.2 g/dL (ref 30.0–36.0)
MCV: 94.7 fL (ref 78.0–100.0)
Platelets: 200 10*3/uL (ref 150–400)
RBC: 3.21 MIL/uL — AB (ref 3.87–5.11)
RDW: 13.9 % (ref 11.5–15.5)
WBC: 6.2 10*3/uL (ref 4.0–10.5)

## 2014-03-22 LAB — BASIC METABOLIC PANEL
Anion gap: 10 (ref 5–15)
BUN: 12 mg/dL (ref 6–23)
CHLORIDE: 104 meq/L (ref 96–112)
CO2: 25 meq/L (ref 19–32)
Calcium: 8.9 mg/dL (ref 8.4–10.5)
Creatinine, Ser: 0.92 mg/dL (ref 0.50–1.10)
GFR calc Af Amer: 67 mL/min — ABNORMAL LOW (ref >90)
GFR calc non Af Amer: 58 mL/min — ABNORMAL LOW (ref >90)
GLUCOSE: 136 mg/dL — AB (ref 70–99)
POTASSIUM: 4.6 meq/L (ref 3.7–5.3)
Sodium: 139 mEq/L (ref 137–147)

## 2014-03-22 MED ORDER — OXYCODONE HCL 10 MG PO TABS: 10.0000 mg | ORAL_TABLET | ORAL | Status: DC | PRN

## 2014-03-22 MED ORDER — ACETAMINOPHEN 325 MG PO TABS: 650.0000 mg | ORAL_TABLET | Freq: Four times a day (QID) | ORAL | Status: DC | PRN

## 2014-03-22 MED ORDER — TIZANIDINE HCL 4 MG PO TABS: 4.0000 mg | ORAL_TABLET | Freq: Four times a day (QID) | ORAL | Status: DC | PRN

## 2014-03-22 MED ORDER — ASPIRIN 325 MG PO TBEC: 325.0000 mg | DELAYED_RELEASE_TABLET | Freq: Two times a day (BID) | ORAL | Status: AC

## 2014-03-22 MED ORDER — POLYETHYLENE GLYCOL 3350 17 G PO PACK: 17.0000 g | PACK | Freq: Two times a day (BID) | ORAL | Status: DC

## 2014-03-22 MED ORDER — FERROUS SULFATE 325 (65 FE) MG PO TABS: 325.0000 mg | ORAL_TABLET | Freq: Three times a day (TID) | ORAL | Status: DC

## 2014-03-22 MED ORDER — FLUOXETINE HCL 10 MG PO CAPS
10.0000 mg | ORAL_CAPSULE | Freq: Every day | ORAL | Status: DC
  Administered 2014-03-22 – 2014-03-24 (×3): 10 mg via ORAL
  Filled 2014-03-22 (×3): qty 1

## 2014-03-22 NOTE — Progress Notes (Signed)
Physical Therapy Treatment Patient Details Name: Becky Duran MRN: [id number] DOB: 10-23-1932 Today's Date: 03/22/2014    History of Present Illness L TKR    PT Comments      Follow Up Recommendations  Home health PT     Equipment Recommendations  None recommended by PT    Recommendations for Other Services OT consult     Precautions / Restrictions Precautions Precautions: Knee;Fall Precaution Comments: Pt with residual numbness operative leg Required Braces or Orthoses: Knee Immobilizer - Left Knee Immobilizer - Left: Discontinue once straight leg raise with < 10 degree lag Restrictions Weight Bearing Restrictions: No Other Position/Activity Restrictions: WBAT    Mobility  Bed Mobility Overal bed mobility: Needs Assistance Bed Mobility: Sit to Supine       Sit to supine: Min assist   General bed mobility comments: cues for sequence and use of R LE to self assist  Transfers Overall transfer level: Needs assistance Equipment used: Rolling walker (2 wheeled) Transfers: Sit to/from Stand Sit to Stand: Min assist;Mod assist         General transfer comment: cues for LE management and use of UEs to self assist  Ambulation/Gait Ambulation/Gait assistance: Min assist;Mod assist Ambulation Distance (Feet): 45 Feet (and 18' to bathroom) Assistive device: Rolling walker (2 wheeled) Gait Pattern/deviations: Step-to pattern;Decreased step length - right;Decreased step length - left;Shuffle;Antalgic Gait velocity: decr   General Gait Details: cues for posture, sequence and position from RW.  Assist of 2 2* buckling L knee with WB   Stairs            Wheelchair Mobility    Modified Rankin (Stroke Patients Only)       Balance                                    Cognition Arousal/Alertness: Awake/alert Behavior During Therapy: WFL for tasks assessed/performed Overall Cognitive Status: Within Functional Limits for tasks assessed                      Exercises      General Comments        Pertinent Vitals/Pain Pain Assessment: 0-10 Pain Score: 5  Pain Location: L knee Pain Descriptors / Indicators: Aching Pain Intervention(s): Limited activity within patient's tolerance;Monitored during session;Premedicated before session;Ice applied    Home Living                      Prior Function            PT Goals (current goals can now be found in the care plan section) Acute Rehab PT Goals Patient Stated Goal: Resume previous lifestyle with decreased pain PT Goal Formulation: With patient Time For Goal Achievement: 03/29/14 Potential to Achieve Goals: Good Progress towards PT goals: Progressing toward goals    Frequency  7X/week    PT Plan Current plan remains appropriate    Co-evaluation             End of Session Equipment Utilized During Treatment: Gait belt;Left knee immobilizer Activity Tolerance: Patient tolerated treatment well Patient left: in bed;with call bell/phone within reach;with nursing/sitter in room     Time: 4742-5956 PT Time Calculation (min) (ACUTE ONLY): 27 min  Charges:  $Gait Training: 23-37 mins                    G Codes:  Becky Duran 03/22/2014, 5:27 PM

## 2014-03-22 NOTE — Progress Notes (Signed)
Utilization review completed.  

## 2014-03-22 NOTE — Care Management Note (Signed)
    Page 1 of 1   03/22/2014     12:12:15 PM CARE MANAGEMENT NOTE 03/22/2014  Patient:  Becky Duran, Becky Duran   Account Number:  [id number]  Date Initiated:  03/22/2014  Documentation initiated by:  [facility]  Subjective/Objective Assessment:   adm: TOTAL KNEE ARTHROPLASTY (Left)     Action/Plan:   discharge planning   Anticipated DC Date:  03/22/2014   Anticipated DC Plan:  [place]  CM consult      [facility] Choice  HOME HEALTH   Choice offered to / List presented to:  [name]        [place] arranged  HH-2 PT      [place]   Status of service:  Completed, signed off Medicare Important Message given?   (If response is "NO", the following Medicare IM given date fields will be blank) Date Medicare IM given:   Medicare IM given by:   Date Additional Medicare IM given:   Additional Medicare IM given by:    Discharge Disposition:  [place]  Per UR Regulation:    If discussed at Long Length of Stay Meetings, dates discussed:    Comments:  03/22/14 09:00 CM met with pt in room to offer choice of home health agency.  Pt chooses [place]. Pt has a rolling walker at home and she states shen she had back issues, she used the sink as support which is right next to her toilet and therefore, does NOT want 3n1.  Address and contact information verified with pt.  Referral called to [facility].  No other Cm needs were communicated.  Mariane Masters, BSN, CM [phone].

## 2014-03-22 NOTE — Plan of Care (Signed)
Problem: Phase I Progression Outcomes Goal: Pain controlled with appropriate interventions Outcome: Completed/Met Date Met:  03/22/14 Goal: Dangle or out of bed evening of surgery Outcome: Completed/Met Date Met:  03/22/14 Goal: Initial discharge plan identified Outcome: Completed/Met Date Met:  03/22/14 Goal: Hemodynamically stable Outcome: Completed/Met Date Met:  03/22/14 Goal: Other Phase I Outcomes/Goals Outcome: Not Applicable Date Met:  43/20/03

## 2014-03-22 NOTE — Evaluation (Signed)
Physical Therapy Evaluation Patient Details Name: Becky Duran MRN: [id number] DOB: February 04, 1933 Today's Date: 03/22/2014   History of Present Illness  L TKR  Clinical Impression  Pt s/p L TKR presents with decreased L LE strength/ROM and post op pain limiting functional mobility.  Pt should progress to d/c home with family assist and HHPT follow up.    Follow Up Recommendations Home health PT    Equipment Recommendations  None recommended by PT    Recommendations for Other Services OT consult     Precautions / Restrictions Precautions Precautions: Knee;Fall Precaution Comments: Pt with residual numbness operative leg Required Braces or Orthoses: Knee Immobilizer - Left Knee Immobilizer - Left: Discontinue once straight leg raise with < 10 degree lag Restrictions Weight Bearing Restrictions: No Other Position/Activity Restrictions: WBAT      Mobility  Bed Mobility Overal bed mobility: Needs Assistance Bed Mobility: Supine to Sit     Supine to sit: Mod assist     General bed mobility comments: cues for sequence and use of R LE to self assist  Transfers Overall transfer level: Needs assistance Equipment used: Rolling walker (2 wheeled) Transfers: Sit to/from Stand Sit to Stand: +2 physical assistance;+2 safety/equipment;Mod assist         General transfer comment: cues for LE management and use of UEs to self assist  Ambulation/Gait Ambulation/Gait assistance: Mod assist;+2 physical assistance Ambulation Distance (Feet): 15 Feet Assistive device: Rolling walker (2 wheeled) Gait Pattern/deviations: Step-to pattern;Decreased step length - right;Decreased step length - left;Shuffle;Trunk flexed Gait velocity: decr   General Gait Details: cues for posture, sequence and position from RW.  Assist of 2 2* buckling L knee with WB  Stairs            Wheelchair Mobility    Modified Rankin (Stroke Patients Only)       Balance                                             Pertinent Vitals/Pain Pain Assessment: 0-10 Pain Score: 5  Pain Location: L knee Pain Intervention(s): Limited activity within patient's tolerance;Monitored during session;Premedicated before session;Ice applied    Home Living Family/patient expects to be discharged to:: Private residence Living Arrangements: Spouse/significant other Available Help at Discharge: Family Type of Home: House Home Access: Stairs to enter   [employer] of Steps: 1 Home Layout: One level;Able to live on main level with bedroom/bathroom Home Equipment: [name] - 2 wheels      Prior Function Level of Independence: Independent with assistive device(s)               Hand Dominance        Extremity/Trunk Assessment   Upper Extremity Assessment: Overall WFL for tasks assessed           Lower Extremity Assessment: LLE deficits/detail   LLE Deficits / Details: 2-/5 quads with AAROM at knee -10 - 90     Communication   Communication: No difficulties  Cognition Arousal/Alertness: Awake/alert Behavior During Therapy: WFL for tasks assessed/performed Overall Cognitive Status: Within Functional Limits for tasks assessed                      General Comments      Exercises Total Joint Exercises Ankle Circles/Pumps: AROM;Both;15 reps;Supine Quad Sets: AROM;Both;10 reps;Supine Heel Slides: AAROM;Left;15 reps;Supine Straight Leg  Raises: AAROM;Left;10 reps;Supine      Assessment/Plan    PT Assessment Patient needs continued PT services  PT Diagnosis Difficulty walking   PT Problem List Decreased strength;Decreased range of motion;Decreased activity tolerance;Decreased mobility;Decreased knowledge of use of DME;Pain  PT Treatment Interventions DME instruction;Gait training;Stair training;Functional mobility training;Therapeutic activities;Therapeutic exercise;Patient/family education   PT Goals (Current goals can be  found in the Care Plan section) Acute Rehab PT Goals Patient Stated Goal: Resume previous lifestyle with decreased pain PT Goal Formulation: With patient Time For Goal Achievement: 03/29/14 Potential to Achieve Goals: Good    Frequency 7X/week   Barriers to discharge        Co-evaluation               End of Session Equipment Utilized During Treatment: Gait belt;Left knee immobilizer Activity Tolerance: Patient tolerated treatment well Patient left: in chair;with call bell/phone within reach;with family/visitor present Nurse Communication: Mobility status         Time: 3536-1443 PT Time Calculation (min) (ACUTE ONLY): 38 min   Charges:   PT Evaluation $Initial PT Evaluation Tier I: 1 Procedure PT Treatments $Therapeutic Exercise: 8-22 mins $Therapeutic Activity: 8-22 mins   PT G Codes:          Jiro Kiester 03/22/2014, 12:43 PM

## 2014-03-22 NOTE — Progress Notes (Signed)
OT Cancellation Note  Patient Details Name: Becky Duran MRN: [id number] DOB: 1932/09/09   Cancelled Treatment:    Reason Eval/Treat Not Completed: Other (comment) Pt comfortable in chair.  Explained role of OT . Pt wants to work with OT later or in morning when leg is not numb to address ADL activity . Thanks,  Betsy Pries 03/22/2014, 11:34 AM

## 2014-03-22 NOTE — Progress Notes (Signed)
     Subjective: 1 Day Post-Op Procedure(s) (LRB): TOTAL KNEE ARTHROPLASTY (Left)   Seen by Dr. Alvan Dame. Patient reports pain as mild, pain controlled. No events throughout the night.   Objective:   VITALS:   Filed Vitals:   03/22/14 0630  BP: 143/65  Pulse: 75  Temp: 98.1 F (36.7 C)  Resp: 16    Dorsiflexion/Plantar flexion intact Incision: dressing C/D/I No cellulitis present Compartment soft  LABS  Recent Labs  03/22/14 0443  HGB 9.8*  HCT 30.4*  WBC 6.2  PLT 200     Recent Labs  03/22/14 0443  NA 139  K 4.6  BUN 12  CREATININE 0.92  GLUCOSE 136*     Assessment/Plan: 1 Day Post-Op Procedure(s) (LRB): TOTAL KNEE ARTHROPLASTY (Left) Foley cath d/c'ed Advance diet Up with therapy D/C IV fluids Discharge home with home health, eventually when ready  Obese (BMI 30-39.9) Estimated body mass index is 30.93 kg/(m^2) as calculated from the following:   Height as of this encounter: 5\' 3"  (1.6 m).   Weight as of this encounter: 79.181 kg (174 lb 9 oz). Patient also counseled that weight may inhibit the healing process Patient counseled that losing weight will help with future health issues      West Pugh. Dysen Edmondson   PAC  03/22/2014, 9:36 AM

## 2014-03-23 LAB — CBC
HCT: 29.4 % — ABNORMAL LOW (ref 36.0–46.0)
HEMOGLOBIN: 9.4 g/dL — AB (ref 12.0–15.0)
MCH: 30.1 pg (ref 26.0–34.0)
MCHC: 32 g/dL (ref 30.0–36.0)
MCV: 94.2 fL (ref 78.0–100.0)
PLATELETS: 212 10*3/uL (ref 150–400)
RBC: 3.12 MIL/uL — AB (ref 3.87–5.11)
RDW: 14.5 % (ref 11.5–15.5)
WBC: 5.9 10*3/uL (ref 4.0–10.5)

## 2014-03-23 LAB — BASIC METABOLIC PANEL
Anion gap: 8 (ref 5–15)
BUN: 12 mg/dL (ref 6–23)
CHLORIDE: 105 meq/L (ref 96–112)
CO2: 27 meq/L (ref 19–32)
CREATININE: 0.91 mg/dL (ref 0.50–1.10)
Calcium: 9.1 mg/dL (ref 8.4–10.5)
GFR calc Af Amer: 68 mL/min — ABNORMAL LOW (ref >90)
GFR calc non Af Amer: 58 mL/min — ABNORMAL LOW (ref >90)
Glucose, Bld: 108 mg/dL — ABNORMAL HIGH (ref 70–99)
Potassium: 4 mEq/L (ref 3.7–5.3)
SODIUM: 140 meq/L (ref 137–147)

## 2014-03-23 NOTE — Evaluation (Signed)
Occupational Therapy Evaluation Patient Details Name: TJUANA VICKREY MRN: [id number] DOB: [birth year] Today's Date: 03/23/2014    History of Present Illness L TKR   Clinical Impression   Pt limited by 8/10 pain with activity and some effort to transfer into bathroom this am. Will benefit from additional OT session to progress safety and independence with self care tasks. Pain down to 3/10 with rest. Will follow.    Follow Up Recommendations  Home health OT;Supervision/Assistance - 24 hour    Equipment Recommendations  3 in 1 bedside comode    Recommendations for Other Services       Precautions / Restrictions Precautions Precautions: Knee;Fall Restrictions Weight Bearing Restrictions: No Other Position/Activity Restrictions: WBAT      Mobility Bed Mobility Overal bed mobility: Needs Assistance Bed Mobility: Supine to Sit     Supine to sit: Min guard        Transfers Overall transfer level: Needs assistance Equipment used: Rolling walker (2 wheeled) Transfers: Sit to/from Stand Sit to Stand: Min assist         General transfer comment: verbal cues for hand placement and LE management.     Balance                                            ADL Overall ADL's : Needs assistance/impaired Eating/Feeding: Independent;Sitting   Grooming: Wash/dry hands;Minimal assistance;Standing   Upper Body Bathing: Set up;Sitting   Lower Body Bathing: Moderate assistance;Sit to/from stand   Upper Body Dressing : Set up;Sitting   Lower Body Dressing: Moderate assistance;Sit to/from stand   Toilet Transfer: Minimal assistance;Ambulation;BSC;RW   Toileting- Clothing Manipulation and Hygiene: Minimal assistance;Sit to/from stand         General ADL Comments: Pt states husband will assist with LB self care. She is not interested in AE at this time. Pt at 8/10 with being up but down to 3/10 with short duration of rest in chair. Pt initially stating  she doesnt want a 3in1 per CM note but feel pt will need 3in1 and pt agreeable. Discussesd sponge bathing initially and let HHOt assess shower transfer especially if pt d/c later today versus practice with OT if ready tomorrow if she is still here. Pt agreeable.      Vision                     Perception     Praxis      Pertinent Vitals/Pain Pain Assessment: 0-10 Pain Score: 8  Pain Location: L knee Pain Descriptors / Indicators: Aching Pain Intervention(s): Ice applied;Repositioned     Hand Dominance     Extremity/Trunk Assessment Upper Extremity Assessment Upper Extremity Assessment: Overall WFL for tasks assessed           Communication Communication Communication: No difficulties   Cognition Arousal/Alertness: Awake/alert Behavior During Therapy: WFL for tasks assessed/performed Overall Cognitive Status: Within Functional Limits for tasks assessed                     General Comments       Exercises       Shoulder Instructions      Home Living Family/patient expects to be discharged to:: Private residence Living Arrangements: Spouse/significant other Available Help at Discharge: Family Type of Home: House Home Access: Stairs to enter [employer] of Steps: 1  Home Layout: One level;Able to live on main level with bedroom/bathroom     Bathroom Shower/Tub: Occupational psychologist: Handicapped height     Home Equipment: Mining engineer - 2 wheels          Prior Functioning/Environment Level of Independence: Independent with assistive device(s)             OT Diagnosis: Generalized weakness   OT Problem List: Decreased strength;Decreased knowledge of use of DME or AE   OT Treatment/Interventions: Self-care/ADL training;Patient/family education;Therapeutic activities;DME and/or AE instruction    OT Goals(Current goals can be found in the care plan section) Acute Rehab OT Goals Patient Stated Goal:  decrease pain OT Goal Formulation: With patient Time For Goal Achievement: 03/30/14 Potential to Achieve Goals: Good  OT Frequency: Min 2X/week   Barriers to D/C:            Co-evaluation              End of Session Equipment Utilized During Treatment: Gait belt;Rolling walker  Activity Tolerance: Patient limited by pain Patient left: in chair;with call bell/phone within reach   Time: 0913-0937 OT Time Calculation (min): 24 min Charges:  OT General Charges $OT Visit: 1 Procedure OT Evaluation $Initial OT Evaluation Tier I: 1 Procedure OT Treatments $Therapeutic Activity: 8-22 mins G-Codes:    Jules Schick  [phone] 03/23/2014, 9:45 AM

## 2014-03-23 NOTE — Progress Notes (Signed)
Physical Therapy Treatment Patient Details Name: Becky Duran MRN: [id number] DOB: [birth year] Today's Date: 03/23/2014    History of Present Illness L TKR    PT Comments    Reviewed don/doff KI and stairs with pt and spouse.  Pt struggling with stairs.  Pt and spouse not confident in ability to perform safely at home and requesting d/c in am.  RN contacting PA  Follow Up Recommendations  Home health PT     Equipment Recommendations  None recommended by PT    Recommendations for Other Services OT consult     Precautions / Restrictions Precautions Precautions: Knee;Fall Required Braces or Orthoses: Knee Immobilizer - Left Knee Immobilizer - Left: Discontinue once straight leg raise with < 10 degree lag Restrictions Weight Bearing Restrictions: No Other Position/Activity Restrictions: WBAT    Mobility  Bed Mobility Overal bed mobility: Needs Assistance Bed Mobility: Sit to Supine       Sit to supine: Min assist   General bed mobility comments: cues for sequence and use of R LE to self assist.   Transfers Overall transfer level: Needs assistance Equipment used: Rolling walker (2 wheeled) Transfers: Sit to/from Stand Sit to Stand: Min guard         General transfer comment: verbal cues for hand placement and LE management. Increased assist 2* height of chair  Ambulation/Gait Ambulation/Gait assistance: Min assist;Min guard Ambulation Distance (Feet): 36 Feet Assistive device: Rolling walker (2 wheeled) Gait Pattern/deviations: Step-to pattern;Decreased step length - right;Decreased step length - left;Shuffle;Trunk flexed Gait velocity: decr   General Gait Details: cues for posture, sequence and position from RW.    Stairs Stairs: Yes Stairs assistance: Mod assist Stair Management: No rails;Step to pattern;Backwards;With walker Number of Stairs: 1 General stair comments: Performed twice with spouse assisting on second attempt.  Cues for sequence and  foot/RW placement.  Pt struggling to complete task.  Pt and spouse state they do not feel confident in ability to safely complete task at home.  Not reattempted 2* pt c/o nausea  Wheelchair Mobility    Modified Rankin (Stroke Patients Only)       Balance                                    Cognition Arousal/Alertness: Awake/alert Behavior During Therapy: WFL for tasks assessed/performed Overall Cognitive Status: Within Functional Limits for tasks assessed                      Exercises      General Comments        Pertinent Vitals/Pain Pain Assessment: 0-10 Pain Score: 6  Pain Location: L knee Pain Descriptors / Indicators: Aching;Sore Pain Intervention(s): Limited activity within patient's tolerance;Monitored during session;Premedicated before session    Home Living                      Prior Function            PT Goals (current goals can now be found in the care plan section) Acute Rehab PT Goals Patient Stated Goal: Home PT Goal Formulation: With patient Time For Goal Achievement: 03/29/14 Potential to Achieve Goals: Good Progress towards PT goals: Progressing toward goals    Frequency  7X/week    PT Plan Current plan remains appropriate    Co-evaluation             End  of Session Equipment Utilized During Treatment: Gait belt;Left knee immobilizer Activity Tolerance: Patient limited by fatigue;Other (comment) (nausea) Patient left: in bed;with call bell/phone within reach;with family/visitor present     Time: 3235-5732 PT Time Calculation (min) (ACUTE ONLY): 27 min  Charges:  $Gait Training: 8-22 mins $Therapeutic Activity: 8-22 mins                    G Codes:      Remedy Corporan 2014-04-17, 5:04 PM

## 2014-03-23 NOTE — Progress Notes (Signed)
Physical Therapy Treatment Patient Details Name: Becky Duran MRN: [id number] DOB: 28-Apr-1933 Today's Date: 03/23/2014    History of Present Illness L TKR    PT Comments    Progressing with mobility with increased stability noted L knee.  Follow Up Recommendations  Home health PT     Equipment Recommendations  None recommended by PT    Recommendations for Other Services OT consult     Precautions / Restrictions Precautions Precautions: Knee;Fall Restrictions Weight Bearing Restrictions: No Other Position/Activity Restrictions: WBAT    Mobility  Bed Mobility Overal bed mobility: Needs Assistance Bed Mobility: Supine to Sit     Supine to sit: Min guard     General bed mobility comments: OOB with OT  Transfers Overall transfer level: Needs assistance Equipment used: Rolling walker (2 wheeled) Transfers: Sit to/from Stand Sit to Stand: Min assist;Mod assist         General transfer comment: verbal cues for hand placement and LE management. Increased assist 2* height of chair  Ambulation/Gait Ambulation/Gait assistance: Min assist Ambulation Distance (Feet): 52 Feet Assistive device: Rolling walker (2 wheeled) Gait Pattern/deviations: Step-to pattern;Decreased step length - right;Decreased step length - left;Shuffle;Antalgic Gait velocity: decr   General Gait Details: cues for posture, sequence and position from RW.    Stairs            Wheelchair Mobility    Modified Rankin (Stroke Patients Only)       Balance                                    Cognition Arousal/Alertness: Awake/alert Behavior During Therapy: WFL for tasks assessed/performed Overall Cognitive Status: Within Functional Limits for tasks assessed                      Exercises Total Joint Exercises Ankle Circles/Pumps: AROM;Both;15 reps;Supine Quad Sets: AROM;Both;Supine;15 reps Heel Slides: AAROM;Left;15 reps;Supine Straight Leg Raises:  AAROM;Left;Supine;15 reps Goniometric ROM: AAROM L knee -10 - 45    General Comments General comments (skin integrity, edema, etc.): min assist balance in standing to pull up gown.      Pertinent Vitals/Pain Pain Assessment: 0-10 Pain Score: 3  Pain Location: L knee  Pain Descriptors / Indicators: Aching;Sore Pain Intervention(s): Limited activity within patient's tolerance;Monitored during session;Premedicated before session;Ice applied    Home Living Family/patient expects to be discharged to:: Private residence Living Arrangements: Spouse/significant other Available Help at Discharge: Family Type of Home: House Home Access: Stairs to enter   Home Layout: One level;Able to live on main level with bedroom/bathroom Home Equipment: Becky Duran - 2 wheels      Prior Function Level of Independence: Independent with assistive device(s)          PT Goals (current goals can now be found in the care plan section) Acute Rehab PT Goals Patient Stated Goal: Home PT Goal Formulation: With patient Time For Goal Achievement: 03/29/14 Potential to Achieve Goals: Good Progress towards PT goals: Progressing toward goals    Frequency  7X/week    PT Plan Current plan remains appropriate    Co-evaluation             End of Session Equipment Utilized During Treatment: Gait belt;Left knee immobilizer Activity Tolerance: Patient tolerated treatment well Patient left: in chair;with call bell/phone within reach;with family/visitor present     Time: 1032-1100 PT Time Calculation (min) (ACUTE ONLY):  28 min  Charges:  $Gait Training: 8-22 mins $Therapeutic Exercise: 8-22 mins                    G Codes:      Becky Duran 04/07/2014, 12:57 PM

## 2014-03-23 NOTE — Progress Notes (Signed)
     Subjective: 2 Days Post-Op Procedure(s) (LRB): TOTAL KNEE ARTHROPLASTY (Left)   Patient reports pain as moderate, pain controlled with the medicine. She feels that she did a lot yesterday and that's why she's in a little more pain this morning. Ready to be discharged home.  Objective:   VITALS:   Filed Vitals:   03/23/14 0420  BP: 145/69  Pulse: 80  Temp: 98.2 F (36.8 C)  Resp: 20    Dorsiflexion/Plantar flexion intact Incision: dressing C/D/I No cellulitis present Compartment soft  LABS  Recent Labs  03/22/14 0443 03/23/14 0438  HGB 9.8* 9.4*  HCT 30.4* 29.4*  WBC 6.2 5.9  PLT 200 212     Recent Labs  03/22/14 0443 03/23/14 0438  NA 139 140  K 4.6 4.0  BUN 12 12  CREATININE 0.92 0.91  GLUCOSE 136* 108*     Assessment/Plan: 2 Days Post-Op Procedure(s) (LRB): TOTAL KNEE ARTHROPLASTY (Left) Up with therapy Discharge home with home health  Follow up in 2 weeks at [facility]. Follow up with OLIN,Revere Maahs D in 2 weeks.  Contact information:  [facility] [address], [address] [place]   PAC  03/23/2014, 7:53 AM

## 2014-03-24 NOTE — Progress Notes (Signed)
Occupational Therapy Treatment Patient Details Name: Becky Duran MRN: [id number] DOB: 1933-05-04 Today's Date: 03/24/2014    History of present illness  L TKR   OT comments  All education completed and patient to discharge home with husband's assistance.   Follow Up Recommendations  Supervision/Assistance - 24 hour;Home health OT    Equipment Recommendations  3 in 1 bedside comode    Recommendations for Other Services      Precautions / Restrictions Precautions Precautions: Knee;Fall Required Braces or Orthoses: Knee Immobilizer - Left Knee Immobilizer - Left: Discontinue once straight leg raise with < 10 degree lag Restrictions Weight Bearing Restrictions: No Other Position/Activity Restrictions: WBAT       Mobility Bed Mobility                  Transfers                      Balance                                   ADL Overall ADL's : Needs assistance/impaired                                       General ADL Comments: Reviewed role of OT with patient. She was sitting up in the recliner, already dressed for home, waiting for her husband so they could practice stair training with PT again. Reviewed LB self-care with patient, she confirms husband will assist with this and therefore AE not addressed. Reviewed toileting with patient, she reports she has been doing this much easier as of last night with help of nursing staff. She reports being able to sit and stand from 3 in 1 over toilet easily and able to do toilet hygiene and clothing management. Educated patient verbally on shower transfer technique. She has walk-in shower. Educated on going in backwards while holding to walker. Patient declined practicing at this time. We also discussed only doing shower transfer with husband's assistance and if she feels comfortable. Patiient agreed to sponge bathe if not comfortable with shower transfer initially. All education  completed and patient to discharge home today.      Vision                     Perception     Praxis      Cognition   Behavior During Therapy: WFL for tasks assessed/performed Overall Cognitive Status: Within Functional Limits for tasks assessed                       Extremity/Trunk Assessment               Exercises     Shoulder Instructions       General Comments      Pertinent Vitals/ Pain       Pain Assessment: No/denies pain  Home Living                                          Prior Functioning/Environment              Frequency Min 2X/week     Progress Toward Goals  OT Goals(current goals  can now be found in the care plan section)  Progress towards OT goals: Progressing toward goals     Plan Discharge plan remains appropriate    Co-evaluation                 End of Session     Activity Tolerance Patient tolerated treatment well   Patient Left in chair;with call bell/phone within reach   Nurse Communication          Time: 0922-0932 OT Time Calculation (min): 10 min  Charges: OT General Charges $OT Visit: 1 Procedure OT Treatments $Self Care/Home Management : 8-22 mins  Wendle Kina A 03/24/2014, 10:21 AM

## 2014-03-24 NOTE — Progress Notes (Signed)
Physical Therapy Treatment Patient Details Name: Becky Duran MRN: [id number] DOB: 24-May-1932 Today's Date: 03/24/2014    History of Present Illness L TKR    PT Comments    Pt progressing with mobility.  States more confident in ability.  Follow Up Recommendations  Home health PT     Equipment Recommendations  None recommended by PT    Recommendations for Other Services OT consult     Precautions / Restrictions Precautions Precautions: Knee;Fall Required Braces or Orthoses: Knee Immobilizer - Left Knee Immobilizer - Left: Discontinue once straight leg raise with < 10 degree lag Restrictions Weight Bearing Restrictions: No Other Position/Activity Restrictions: WBAT    Mobility  Bed Mobility Overal bed mobility: Needs Assistance Bed Mobility: Supine to Sit     Supine to sit: Min guard     General bed mobility comments: cues for sequence and use of R LE to self assist.   Transfers Overall transfer level: Needs assistance Equipment used: Rolling walker (2 wheeled) Transfers: Sit to/from Stand Sit to Stand: Min guard         General transfer comment: verbal cues for hand placement and LE management. Increased assist 2* height of chair  Ambulation/Gait Ambulation/Gait assistance: Min guard Ambulation Distance (Feet): 33 Feet Assistive device: Rolling walker (2 wheeled) Gait Pattern/deviations: Step-to pattern;Decreased step length - right;Decreased step length - left;Shuffle;Trunk flexed Gait velocity: decr   General Gait Details: cues for posture, sequence and position from RW.    Stairs            Wheelchair Mobility    Modified Rankin (Stroke Patients Only)       Balance                                    Cognition Arousal/Alertness: Awake/alert Behavior During Therapy: WFL for tasks assessed/performed Overall Cognitive Status: Within Functional Limits for tasks assessed                      Exercises Total  Joint Exercises Ankle Circles/Pumps: AROM;Both;15 reps;Supine Quad Sets: AROM;Both;Supine;15 reps Heel Slides: AAROM;Left;15 reps;Supine Straight Leg Raises: AAROM;Left;Supine;15 reps Goniometric ROM: AAROM R knee -10 - 50    General Comments        Pertinent Vitals/Pain Pain Assessment: No/denies pain Pain Score: 5  Pain Location: L knee Pain Descriptors / Indicators: Aching;Sore Pain Intervention(s): Limited activity within patient's tolerance;Monitored during session;Premedicated before session;Ice applied    Home Living                      Prior Function            PT Goals (current goals can now be found in the care plan section) Acute Rehab PT Goals Patient Stated Goal: Home PT Goal Formulation: With patient Time For Goal Achievement: 03/29/14 Potential to Achieve Goals: Good Progress towards PT goals: Progressing toward goals    Frequency  7X/week    PT Plan Current plan remains appropriate    Co-evaluation             End of Session Equipment Utilized During Treatment: Gait belt;Left knee immobilizer Activity Tolerance: Patient tolerated treatment well;Patient limited by fatigue Patient left: in chair;with call bell/phone within reach     Time: 0998-3382 PT Time Calculation (min) (ACUTE ONLY): 29 min  Charges:  $Gait Training: 8-22 mins $Therapeutic Exercise: 8-22 mins  G Codes:      Becky Duran 04-07-2014, 12:24 PM

## 2014-03-24 NOTE — Progress Notes (Signed)
Physical Therapy Treatment Patient Details Name: Becky Duran MRN: [id number] DOB: 03-11-1933 Today's Date: 03/24/2014    History of Present Illness L TKR    PT Comments    Pt progressing well.  Spouse assisting with don/doff KI and stair training.    Follow Up Recommendations  Home health PT     Equipment Recommendations  None recommended by PT    Recommendations for Other Services OT consult     Precautions / Restrictions Precautions Precautions: Knee;Fall Required Braces or Orthoses: Knee Immobilizer - Left Knee Immobilizer - Left: Discontinue once straight leg raise with < 10 degree lag Restrictions Weight Bearing Restrictions: No Other Position/Activity Restrictions: WBAT    Mobility  Bed Mobility Overal bed mobility: Needs Assistance Bed Mobility: Supine to Sit     Supine to sit: Min guard     General bed mobility comments: cues for sequence and use of R LE to self assist.   Transfers Overall transfer level: Needs assistance Equipment used: Rolling walker (2 wheeled) Transfers: Sit to/from Stand Sit to Stand: Supervision         General transfer comment: verbal cues for hand placement and LE management. Increased assist 2* height of chair  Ambulation/Gait Ambulation/Gait assistance: Min guard;Supervision Ambulation Distance (Feet): 28 Feet Assistive device: Rolling walker (2 wheeled) Gait Pattern/deviations: Step-to pattern;Decreased step length - right;Decreased step length - left;Shuffle;Trunk flexed Gait velocity: decr   General Gait Details: cues for posture, sequence and position from RW.    Stairs Stairs: Yes Stairs assistance: Min assist Stair Management: No rails;Step to pattern;Backwards;With walker Number of Stairs: 1 General stair comments: Pt spouse assisting with ltd VC   Wheelchair Mobility    Modified Rankin (Stroke Patients Only)       Balance                                    Cognition  Arousal/Alertness: Awake/alert Behavior During Therapy: WFL for tasks assessed/performed Overall Cognitive Status: Within Functional Limits for tasks assessed                      Exercises Total Joint Exercises Ankle Circles/Pumps: AROM;Both;15 reps;Supine Quad Sets: AROM;Both;Supine;15 reps Heel Slides: AAROM;Left;15 reps;Supine Straight Leg Raises: AAROM;Left;Supine;15 reps Goniometric ROM: AAROM R knee -10 - 50    General Comments        Pertinent Vitals/Pain Pain Assessment: 0-10 Pain Score: 4  Pain Location: L knee Pain Descriptors / Indicators: Aching;Sore Pain Intervention(s): Limited activity within patient's tolerance;Monitored during session;Ice applied;Premedicated before session    Home Living                      Prior Function            PT Goals (current goals can now be found in the care plan section) Acute Rehab PT Goals Patient Stated Goal: Home PT Goal Formulation: With patient Time For Goal Achievement: 03/29/14 Potential to Achieve Goals: Good Progress towards PT goals: Progressing toward goals    Frequency  7X/week    PT Plan Current plan remains appropriate    Co-evaluation             End of Session Equipment Utilized During Treatment: Gait belt;Left knee immobilizer Activity Tolerance: Patient tolerated treatment well;Patient limited by fatigue Patient left: in chair;with call bell/phone within reach;with family/visitor present     Time: 7829-5621 PT  Time Calculation (min) (ACUTE ONLY): 23 min  Charges:  $Gait Training: 8-22 mins $Therapeutic Exercise: 8-22 mins $Therapeutic Activity: 8-22 mins                    G Codes:      Cassi Jenne 03-30-2014, 12:31 PM

## 2014-03-24 NOTE — Progress Notes (Signed)
     Subjective: 3 Days Post-Op Procedure(s) (LRB): TOTAL KNEE ARTHROPLASTY (Left)   Patient reports pain as mild, pain controlled. Continuing to improve with PT. Plan for discharge home after working with PT this morning.  Objective:   VITALS:   Filed Vitals:   03/24/14  BP: 128/69  Pulse: 96  Temp: 98.6 F (36.6 C)   Resp: 18    Dorsiflexion/Plantar flexion intact Incision: dressing C/D/I No cellulitis present Compartment soft  LABS  Recent Labs  03/22/14 0443 03/23/14 0438  HGB 9.8* 9.4*  HCT 30.4* 29.4*  WBC 6.2 5.9  PLT 200 212     Recent Labs  03/22/14 0443 03/23/14 0438  NA 139 140  K 4.6 4.0  BUN 12 12  CREATININE 0.92 0.91  GLUCOSE 136* 108*     Assessment/Plan: 3 Days Post-Op Procedure(s) (LRB): TOTAL KNEE ARTHROPLASTY (Left) Up with therapy Discharge home with home health  Follow up in 2 weeks at [facility]. Follow up with OLIN,Amiyrah Lamere D in 2 weeks.  Contact information:  [facility] [address], [address] [place] Becky Duran   PAC  03/24/2014, 8:14 AM

## 2014-03-24 NOTE — Progress Notes (Signed)
[facility]   [facility] is providing the following services: RW and [employer]  If patient discharges after hours, please call [phone].   Becky Duran 03/24/2014, 10:35 AM

## 2014-03-24 NOTE — Progress Notes (Signed)
Pt to d/c home with [facility] home [facility]. DME delivered to room before d/c. AVS reviewed and "My Chart" discussed with pt. Pt capable of verbalizing medications, signs and symptoms of infection, and follow-up appointments. Remains hemodynamically stable. No signs and symptoms of distress. Educated pt to return to ER in the case of SOB, dizziness, or chest pain.

## 2014-03-25 DIAGNOSIS — F329 Major depressive disorder, single episode, unspecified: Secondary | ICD-10-CM | POA: Diagnosis not present

## 2014-03-25 DIAGNOSIS — I10 Essential (primary) hypertension: Secondary | ICD-10-CM | POA: Diagnosis not present

## 2014-03-25 DIAGNOSIS — Z96652 Presence of left artificial knee joint: Secondary | ICD-10-CM | POA: Diagnosis not present

## 2014-03-25 DIAGNOSIS — K219 Gastro-esophageal reflux disease without esophagitis: Secondary | ICD-10-CM | POA: Diagnosis not present

## 2014-03-25 DIAGNOSIS — E785 Hyperlipidemia, unspecified: Secondary | ICD-10-CM | POA: Diagnosis not present

## 2014-03-25 DIAGNOSIS — Z471 Aftercare following joint replacement surgery: Secondary | ICD-10-CM | POA: Diagnosis not present

## 2014-04-13 DIAGNOSIS — M179 Osteoarthritis of knee, unspecified: Secondary | ICD-10-CM | POA: Diagnosis not present

## 2014-04-13 NOTE — Discharge Summary (Signed)
Physician Discharge Summary  Patient ID: CHIZUKO TRINE MRN: [id number] DOB/AGE: [birth year] 80 y.o.  Admit date: 03/21/2014 Discharge date: 03/24/2014   Procedures:  Procedure(s) (LRB): TOTAL KNEE ARTHROPLASTY (Left)  Attending Physician:  Dr. Paralee Cancel   Admission Diagnoses:   Left knee primary OA / pain  Discharge Diagnoses:  Principal Problem:   S/P left TKA Active Problems:   S/P knee replacement   Obese  Past Medical History  Diagnosis Date  . GERD (gastroesophageal reflux disease)   . Thyroid disease     hypothyroidism  . Hyperlipidemia   . Endometriosis   . Osteoarthritis   . Osteopenia   . Scoliosis   . Hypothyroidism   . Hypertension   . H/O hiatal hernia     small Hital Hernia  . Psoriasis     Chest, Back and face  . Constipation   . Heel fracture   . Psoriasis     dx'd @10yrs . ago.  . Complication of anesthesia     pt has difficulty at night with BP spiking will experience headache occurs sporactically   . Heart murmur     related to past childhood dx pt not sure of what the disease was  . Pneumonia     hx of   . Depression     began prozac with menopause   . Kidney stones     lithotripsy 03/09/2014  . Headache     when BP spikes  . Anemia     hx of     HPI: Becky Duran, 80 y.o. female, has a history of pain and functional disability in the left knee due to arthritis and has failed non-surgical conservative treatments for greater than 12 weeks to includeNSAID's and/or analgesics, corticosteriod injections, use of assistive devices and activity modification. Onset of symptoms was gradual, starting 1.5-2 years ago with gradually worsening course since that time. The patient noted no past surgery on the left knee(s). Patient currently rates pain in the left knee(s) at 8 out of 10 with activity. Patient has worsening of pain with activity and weight bearing, pain that interferes with activities of daily living, pain with passive range of  motion, crepitus and joint swelling. Patient has evidence of periarticular osteophytes and joint space narrowing by imaging studies. There is no active infection. Risks, benefits and expectations were discussed with the patient. Risks including but not limited to the risk of anesthesia, blood clots, nerve damage, blood vessel damage, failure of the prosthesis, infection and up to and including death. Patient understand the risks, benefits and expectations and wishes to proceed with surgery.  PCP: [name], MD   Discharged Condition: good  Hospital Course:  Patient underwent the above stated procedure on 03/21/2014. Patient tolerated the procedure well and brought to the recovery room in good condition and subsequently to the floor.  POD #1 BP: 143/65 ; Pulse: 75 ; Temp: 98.1 F (36.7 C) ; Resp: 16 Patient reports pain as mild, pain controlled. No events throughout the night.  Dorsiflexion/plantar flexion intact, incision: dressing C/D/I, no cellulitis present and compartment soft.   LABS  Basename    HGB  9.8  HCT  30.4   POD #2  BP: 145/69 ; Pulse: 80 ; Temp: 98.2 F (36.8 C) ; Resp: 20 Patient reports pain as moderate, pain controlled with the medicine. She feels that she did a lot yesterday and that's why she's in a little more pain this morning. Dorsiflexion/plantar flexion intact, incision: dressing  C/D/I, no cellulitis present and compartment soft.   LABS  Basename    HGB  9.4  HCT  29.4   POD #3  BP: 128/69 ; Pulse: 96 ; Temp: 98.6 F (36.6 C) ; Resp: 18 Patient reports pain as mild, pain controlled. Continuing to improve with PT. Plan for discharge home after working with PT this morning. Dorsiflexion/plantar flexion intact, incision: dressing C/D/I, no cellulitis present and compartment soft.   LABS   No new labs  Discharge Exam: General appearance: alert, cooperative and no distress Extremities: Homans sign is negative, no sign of DVT, no edema, redness or  tenderness in the calves or thighs and no ulcers, gangrene or trophic changes  Disposition: Home with follow up in 2 weeks   Follow-up Information    Follow up with [facility].   Why:  home health physical therapy   Contact information:   [address] [place] [address] [phone]       Follow up with Mauri Pole, MD. Schedule an appointment as soon as possible for a visit in 2 weeks.   Specialty:  Orthopedic Surgery   Contact information:   [address] [place] [address] [phone]       Discharge Instructions    Call MD / Call 911    Complete by:  As directed   If you experience chest pain or shortness of breath, CALL 911 and be transported to the hospital emergency room.  If you develope a fever above 101 F, pus (white drainage) or increased drainage or redness at the wound, or calf pain, call your surgeon's office.     Change dressing    Complete by:  As directed   Maintain surgical dressing for 10-14 days, or until follow up in the clinic.     Constipation Prevention    Complete by:  As directed   Drink plenty of fluids.  Prune juice may be helpful.  You may use a stool softener, such as Colace (over the counter) 100 mg twice a day.  Use MiraLax (over the counter) for constipation as needed.     Diet - low sodium heart healthy    Complete by:  As directed      Discharge instructions    Complete by:  As directed   Maintain surgical dressing for 10-14 days, or until follow up in the clinic. Follow up in 2 weeks at [facility]. Call with any questions or concerns.     Increase activity slowly as tolerated    Complete by:  As directed      TED hose    Complete by:  As directed   Use stockings (TED hose) for 2 weeks on both leg(s).  You may remove them at night for sleeping.     Weight bearing as tolerated    Complete by:  As directed   Laterality:  left  Extremity:  Lower             Medication  List    STOP taking these medications        aspirin 81 MG tablet  Replaced by:  aspirin 325 MG EC tablet     oxyCODONE-acetaminophen 10-325 MG per tablet  Commonly known as:  PERCOCET     PRESCRIPTION MEDICATION      TAKE these medications        acetaminophen 325 MG tablet  Commonly known as:  TYLENOL  Take 2 tablets (650 mg  total) by mouth every 6 (six) hours as needed for mild pain (or Fever >/= 101).     amLODipine 5 MG tablet  Commonly known as:  NORVASC  Take 5 mg by mouth every morning.     aspirin 325 MG EC tablet  Take 1 tablet (325 mg total) by mouth 2 (two) times daily.     atorvastatin 20 MG tablet  Commonly known as:  LIPITOR  Take 20 mg by mouth every evening.     b complex vitamins tablet  Take 1 tablet by mouth every morning.     CALCIUM + D PO  Take 1 tablet by mouth daily.     esomeprazole 20 MG capsule  Commonly known as:  NEXIUM  Take 20 mg by mouth daily at 12 noon.     ferrous sulfate 325 (65 FE) MG tablet  Take 1 tablet (325 mg total) by mouth 3 (three) times daily after meals.     FLUoxetine 10 MG tablet  Commonly known as:  PROZAC  Take 10 mg by mouth every morning.     levothyroxine 75 MCG tablet  Commonly known as:  SYNTHROID, LEVOTHROID  Take 75 mcg by mouth daily before breakfast.     losartan 50 MG tablet  Commonly known as:  COZAAR  Take 50 mg by mouth every evening.     multivitamin with minerals Tabs tablet  Take 1 tablet by mouth every morning.     Oxycodone HCl 10 MG Tabs  Take 1-2 tablets (10-20 mg total) by mouth every 4 (four) hours as needed for severe pain.     polyethylene glycol packet  Commonly known as:  MIRALAX / GLYCOLAX  Take 17 g by mouth 2 (two) times daily.     PROBIOTIC DAILY PO  Take 1 tablet by mouth every morning.     tiZANidine 4 MG tablet  Commonly known as:  ZANAFLEX  Take 1 tablet (4 mg total) by mouth every 6 (six) hours as needed.     vitamin B-12 1000 MCG tablet  Commonly known as:   CYANOCOBALAMIN  Take 1,000 mcg by mouth every morning.     vitamin C 500 MG tablet  Commonly known as:  ASCORBIC ACID  Take 500 mg by mouth 2 (two) times daily.         Signed: West Pugh. Tomorrow Dehaas   PA-C  04/13/2014, 3:46 PM

## 2014-04-17 DIAGNOSIS — M179 Osteoarthritis of knee, unspecified: Secondary | ICD-10-CM | POA: Diagnosis not present

## 2014-04-19 DIAGNOSIS — M1712 Unilateral primary osteoarthritis, left knee: Secondary | ICD-10-CM | POA: Diagnosis not present

## 2014-04-20 DIAGNOSIS — M179 Osteoarthritis of knee, unspecified: Secondary | ICD-10-CM | POA: Diagnosis not present

## 2014-04-24 DIAGNOSIS — M1712 Unilateral primary osteoarthritis, left knee: Secondary | ICD-10-CM | POA: Diagnosis not present

## 2014-05-01 DIAGNOSIS — M1712 Unilateral primary osteoarthritis, left knee: Secondary | ICD-10-CM | POA: Diagnosis not present

## 2014-05-03 DIAGNOSIS — M1712 Unilateral primary osteoarthritis, left knee: Secondary | ICD-10-CM | POA: Diagnosis not present

## 2014-05-09 DIAGNOSIS — M179 Osteoarthritis of knee, unspecified: Secondary | ICD-10-CM | POA: Diagnosis not present

## 2014-05-11 DIAGNOSIS — Z471 Aftercare following joint replacement surgery: Secondary | ICD-10-CM | POA: Diagnosis not present

## 2014-05-11 DIAGNOSIS — Z96652 Presence of left artificial knee joint: Secondary | ICD-10-CM | POA: Diagnosis not present

## 2014-05-12 DIAGNOSIS — M5136 Other intervertebral disc degeneration, lumbar region: Secondary | ICD-10-CM | POA: Diagnosis not present

## 2014-05-12 DIAGNOSIS — M47816 Spondylosis without myelopathy or radiculopathy, lumbar region: Secondary | ICD-10-CM | POA: Diagnosis not present

## 2014-05-12 DIAGNOSIS — M545 Low back pain: Secondary | ICD-10-CM | POA: Diagnosis not present

## 2014-05-15 DIAGNOSIS — M1712 Unilateral primary osteoarthritis, left knee: Secondary | ICD-10-CM | POA: Diagnosis not present

## 2014-05-19 DIAGNOSIS — M1712 Unilateral primary osteoarthritis, left knee: Secondary | ICD-10-CM | POA: Diagnosis not present

## 2014-05-19 DIAGNOSIS — M25562 Pain in left knee: Secondary | ICD-10-CM | POA: Diagnosis not present

## 2014-05-19 DIAGNOSIS — G894 Chronic pain syndrome: Secondary | ICD-10-CM | POA: Diagnosis not present

## 2014-05-19 DIAGNOSIS — M5136 Other intervertebral disc degeneration, lumbar region: Secondary | ICD-10-CM | POA: Diagnosis not present

## 2014-05-29 DIAGNOSIS — M5136 Other intervertebral disc degeneration, lumbar region: Secondary | ICD-10-CM | POA: Diagnosis not present

## 2014-05-31 DIAGNOSIS — S32040S Wedge compression fracture of fourth lumbar vertebra, sequela: Secondary | ICD-10-CM | POA: Diagnosis not present

## 2014-05-31 DIAGNOSIS — M5136 Other intervertebral disc degeneration, lumbar region: Secondary | ICD-10-CM | POA: Diagnosis not present

## 2014-06-01 DIAGNOSIS — F419 Anxiety disorder, unspecified: Secondary | ICD-10-CM | POA: Diagnosis not present

## 2014-06-01 DIAGNOSIS — K219 Gastro-esophageal reflux disease without esophagitis: Secondary | ICD-10-CM | POA: Diagnosis not present

## 2014-06-01 DIAGNOSIS — E039 Hypothyroidism, unspecified: Secondary | ICD-10-CM | POA: Diagnosis not present

## 2014-06-01 DIAGNOSIS — M1990 Unspecified osteoarthritis, unspecified site: Secondary | ICD-10-CM | POA: Diagnosis not present

## 2014-06-01 DIAGNOSIS — I10 Essential (primary) hypertension: Secondary | ICD-10-CM | POA: Diagnosis not present

## 2014-06-01 DIAGNOSIS — E780 Pure hypercholesterolemia: Secondary | ICD-10-CM | POA: Diagnosis not present

## 2014-06-01 DIAGNOSIS — M4850XD Collapsed vertebra, not elsewhere classified, site unspecified, subsequent encounter for fracture with routine healing: Secondary | ICD-10-CM | POA: Diagnosis not present

## 2014-06-01 DIAGNOSIS — F329 Major depressive disorder, single episode, unspecified: Secondary | ICD-10-CM | POA: Diagnosis not present

## 2014-06-06 ENCOUNTER — Other Ambulatory Visit (HOSPITAL_COMMUNITY): Payer: Self-pay | Admitting: Interventional Radiology

## 2014-06-06 DIAGNOSIS — M549 Dorsalgia, unspecified: Secondary | ICD-10-CM

## 2014-06-06 DIAGNOSIS — S32040A Wedge compression fracture of fourth lumbar vertebra, initial encounter for closed fracture: Secondary | ICD-10-CM

## 2014-06-06 DIAGNOSIS — IMO0002 Reserved for concepts with insufficient information to code with codable children: Secondary | ICD-10-CM

## 2014-06-06 DIAGNOSIS — S32000A Wedge compression fracture of unspecified lumbar vertebra, initial encounter for closed fracture: Secondary | ICD-10-CM

## 2014-06-07 ENCOUNTER — Other Ambulatory Visit: Payer: Self-pay | Admitting: Radiology

## 2014-06-08 ENCOUNTER — Other Ambulatory Visit: Payer: Self-pay | Admitting: Radiology

## 2014-06-09 ENCOUNTER — Ambulatory Visit (HOSPITAL_COMMUNITY)
Admission: RE | Admit: 2014-06-09 | Discharge: 2014-06-09 | Disposition: A | Discharge status: Home / Self Care | Payer: Medicare Other | Source: Ambulatory Visit | Attending: Interventional Radiology | Admitting: Interventional Radiology

## 2014-06-09 ENCOUNTER — Encounter (HOSPITAL_COMMUNITY): Payer: Self-pay

## 2014-06-09 DIAGNOSIS — K219 Gastro-esophageal reflux disease without esophagitis: Secondary | ICD-10-CM | POA: Diagnosis not present

## 2014-06-09 DIAGNOSIS — E785 Hyperlipidemia, unspecified: Secondary | ICD-10-CM | POA: Insufficient documentation

## 2014-06-09 DIAGNOSIS — M549 Dorsalgia, unspecified: Secondary | ICD-10-CM

## 2014-06-09 DIAGNOSIS — S32040A Wedge compression fracture of fourth lumbar vertebra, initial encounter for closed fracture: Secondary | ICD-10-CM

## 2014-06-09 DIAGNOSIS — M4856XA Collapsed vertebra, not elsewhere classified, lumbar region, initial encounter for fracture: Secondary | ICD-10-CM | POA: Diagnosis present

## 2014-06-09 DIAGNOSIS — I10 Essential (primary) hypertension: Secondary | ICD-10-CM | POA: Diagnosis not present

## 2014-06-09 DIAGNOSIS — M545 Low back pain: Secondary | ICD-10-CM | POA: Diagnosis not present

## 2014-06-09 DIAGNOSIS — E039 Hypothyroidism, unspecified: Secondary | ICD-10-CM | POA: Diagnosis not present

## 2014-06-09 LAB — CBC WITH DIFFERENTIAL/PLATELET
Basophils Absolute: 0 10*3/uL (ref 0.0–0.1)
Basophils Relative: 1 % (ref 0–1)
Eosinophils Absolute: 0.1 10*3/uL (ref 0.0–0.7)
Eosinophils Relative: 3 % (ref 0–5)
HCT: 38.9 % (ref 36.0–46.0)
HEMOGLOBIN: 12.3 g/dL (ref 12.0–15.0)
LYMPHS ABS: 1.3 10*3/uL (ref 0.7–4.0)
Lymphocytes Relative: 31 % (ref 12–46)
MCH: 29.7 pg (ref 26.0–34.0)
MCHC: 31.6 g/dL (ref 30.0–36.0)
MCV: 94 fL (ref 78.0–100.0)
Monocytes Absolute: 0.4 10*3/uL (ref 0.1–1.0)
Monocytes Relative: 9 % (ref 3–12)
NEUTROS PCT: 56 % (ref 43–77)
Neutro Abs: 2.4 10*3/uL (ref 1.7–7.7)
PLATELETS: 198 10*3/uL (ref 150–400)
RBC: 4.14 MIL/uL (ref 3.87–5.11)
RDW: 13.4 % (ref 11.5–15.5)
WBC: 4.3 10*3/uL (ref 4.0–10.5)

## 2014-06-09 LAB — BASIC METABOLIC PANEL
Anion gap: 6 (ref 5–15)
BUN: 21 mg/dL (ref 6–23)
CHLORIDE: 104 mmol/L (ref 96–112)
CO2: 30 mmol/L (ref 19–32)
CREATININE: 0.9 mg/dL (ref 0.50–1.10)
Calcium: 9 mg/dL (ref 8.4–10.5)
GFR calc Af Amer: 69 mL/min — ABNORMAL LOW (ref >90)
GFR calc non Af Amer: 59 mL/min — ABNORMAL LOW (ref >90)
Glucose, Bld: 79 mg/dL (ref 70–99)
Potassium: 4.1 mmol/L (ref 3.5–5.1)
SODIUM: 140 mmol/L (ref 135–145)

## 2014-06-09 LAB — APTT: aPTT: 28 seconds (ref 24–37)

## 2014-06-09 LAB — PROTIME-INR
INR: 1.06 (ref 0.00–1.49)
PROTHROMBIN TIME: 13.9 s (ref 11.6–15.2)

## 2014-06-09 MED ORDER — MIDAZOLAM HCL 2 MG/2ML IJ SOLN
INTRAMUSCULAR | Status: AC
  Filled 2014-06-09: qty 2

## 2014-06-09 MED ORDER — VANCOMYCIN HCL IN DEXTROSE 1-5 GM/200ML-% IV SOLN
1000.0000 mg | Freq: Once | INTRAVENOUS | Status: AC
  Administered 2014-06-09: 1000 mg via INTRAVENOUS
  Filled 2014-06-09 (×2): qty 200

## 2014-06-09 MED ORDER — HYDROMORPHONE HCL 1 MG/ML IJ SOLN
INTRAMUSCULAR | Status: AC | PRN
  Administered 2014-06-09: 1 mg via INTRAVENOUS

## 2014-06-09 MED ORDER — HYDROMORPHONE HCL 1 MG/ML IJ SOLN
INTRAMUSCULAR | Status: AC
  Filled 2014-06-09: qty 1

## 2014-06-09 MED ORDER — FENTANYL CITRATE 0.05 MG/ML IJ SOLN
INTRAMUSCULAR | Status: AC
  Filled 2014-06-09: qty 4

## 2014-06-09 MED ORDER — GELATIN ABSORBABLE 12-7 MM EX MISC
CUTANEOUS | Status: AC
  Filled 2014-06-09: qty 1

## 2014-06-09 MED ORDER — SODIUM CHLORIDE 0.9 % IV SOLN: INTRAVENOUS | Status: DC

## 2014-06-09 MED ORDER — OXYCODONE HCL 5 MG PO TABS
ORAL_TABLET | ORAL | Status: AC
  Filled 2014-06-09: qty 3

## 2014-06-09 MED ORDER — BUPIVACAINE HCL (PF) 0.25 % IJ SOLN
INTRAMUSCULAR | Status: AC
  Filled 2014-06-09: qty 30

## 2014-06-09 MED ORDER — MIDAZOLAM HCL 2 MG/2ML IJ SOLN
INTRAMUSCULAR | Status: AC
Start: 2014-06-09 — End: 2014-06-09
  Filled 2014-06-09: qty 4

## 2014-06-09 MED ORDER — FENTANYL CITRATE 0.05 MG/ML IJ SOLN
INTRAMUSCULAR | Status: AC
  Filled 2014-06-09: qty 2

## 2014-06-09 MED ORDER — FENTANYL CITRATE 0.05 MG/ML IJ SOLN
INTRAMUSCULAR | Status: AC | PRN
  Administered 2014-06-09 (×7): 25 ug via INTRAVENOUS

## 2014-06-09 MED ORDER — TOBRAMYCIN SULFATE 1.2 G IJ SOLR
INTRAMUSCULAR | Status: AC
  Filled 2014-06-09: qty 1.2

## 2014-06-09 MED ORDER — OXYCODONE HCL 5 MG PO TABS
15.0000 mg | ORAL_TABLET | ORAL | Status: DC | PRN
  Administered 2014-06-09: 15 mg via ORAL

## 2014-06-09 MED ORDER — SODIUM CHLORIDE 0.9 % IV SOLN
INTRAVENOUS | Status: AC
  Administered 2014-06-09: 12:00:00 via INTRAVENOUS

## 2014-06-09 MED ORDER — MIDAZOLAM HCL 2 MG/2ML IJ SOLN
INTRAMUSCULAR | Status: AC | PRN
  Administered 2014-06-09 (×5): 1 mg via INTRAVENOUS

## 2014-06-09 NOTE — H&P (Signed)
Chief Complaint: "Back pain."  Referring Physician(s): [facility]   History of Present Illness: Becky Duran is a 81 y.o. female with acute lower back pain since 05/17/14 when she stood up and felt a "pop." She denies any loss of sensation in her lower extremities. She denies any loss of bowel or bladder function. She denies any pain radiating down her legs, she currently rates her pain 10/10 and has been taking percocet and oxycodone which bring her pain to 5/10 for approximately 2 hours. She has had recent imaging which reveal acute L4 compression fracture and she is here today for a VP/KP. She denies any chest pain, shortness of breath or palpitations. She denies any active signs of bleeding or excessive bruising. She denies any recent fever or chills. The patient denies any history of sleep apnea or chronic oxygen use. She has previously tolerated sedation without complications.    Past Medical History  Diagnosis Date  . GERD (gastroesophageal reflux disease)   . Thyroid disease     hypothyroidism  . Hyperlipidemia   . Endometriosis   . Osteoarthritis   . Osteopenia   . Scoliosis   . Hypothyroidism   . Hypertension   . H/O hiatal hernia     small Hital Hernia  . Psoriasis     Chest, Back and face  . Constipation   . Heel fracture   . Psoriasis     dx'd @10yrs . ago.  . Complication of anesthesia     pt has difficulty at night with BP spiking will experience headache occurs sporactically   . Heart murmur     related to past childhood dx pt not sure of what the disease was  . Pneumonia     hx of   . Depression     began prozac with menopause   . Kidney stones     lithotripsy 03/09/2014  . Headache     when BP spikes  . Anemia     hx of     Past Surgical History  Procedure Laterality Date  . Lipoma excision  2012     Back  . Abdominal hysterectomy    . Cardiac catheterization  2011  . Eye surgery  4 years ago    cataract with lens- bil  . Breast  lumpectomy      20 years ago- bengin  . Nephrolithotomy      for kidney stone- > 2 years.  . Lumbar laminectomy/decompression microdiscectomy  06/04/2011    Procedure: LUMBAR LAMINECTOMY/DECOMPRESSION MICRODISCECTOMY;  Surgeon: Floyce Stakes, MD;  Location: [facility];  Service: Neurosurgery;  Laterality: N/A;  Lumbar Four-Five partial Lumbar Three Laminectomy  . Lithotripsy      03/09/2014  . Colonscopy    . Total knee arthroplasty Left 03/21/2014    Procedure: TOTAL KNEE ARTHROPLASTY;  Surgeon: Mauri Pole, MD;  Location: [facility];  Service: Orthopedics;  Laterality: Left;  left femoral nerve block    Allergies: Colace; Ciprofloxacin; Diclofenac sodium; Etodolac; Penicillins; Relafen; Sulfa drugs cross reactors; and Valacyclovir hcl  Medications: Prior to Admission medications   Medication Sig Start Date End Date Taking? Authorizing Provider  amLODipine (NORVASC) 5 MG tablet Take 5 mg by mouth every morning.    Yes Historical Provider, MD  atorvastatin (LIPITOR) 20 MG tablet Take 20 mg by mouth every evening.    Yes Historical Provider, MD  b complex vitamins tablet Take 1 tablet by mouth every morning.   Yes Historical  Provider, MD  Calcium Carbonate-Vitamin D (CALCIUM + D PO) Take 1 tablet by mouth daily.   Yes Historical Provider, MD  esomeprazole (NEXIUM) 20 MG capsule Take 20 mg by mouth daily at 12 noon.   Yes Historical Provider, MD  FLUoxetine (PROZAC) 10 MG tablet Take 10 mg by mouth every morning.    Yes Historical Provider, MD  levothyroxine (SYNTHROID, LEVOTHROID) 75 MCG tablet Take 75 mcg by mouth daily before breakfast.    Yes Historical Provider, MD  losartan (COZAAR) 50 MG tablet Take 50 mg by mouth every evening.    Yes Historical Provider, MD  Multiple Vitamin (MULTIVITAMIN WITH MINERALS) TABS tablet Take 1 tablet by mouth every morning.   Yes Historical Provider, MD  oxyCODONE (ROXICODONE) 15 MG immediate release tablet Take 15 mg by mouth every 4 (four) hours as  needed for pain.   Yes Historical Provider, MD  polyethylene glycol (MIRALAX / GLYCOLAX) packet Take 17 g by mouth 2 (two) times daily. 03/22/14  Yes Lucille Passy Babish, PA-C  Probiotic Product (PROBIOTIC DAILY PO) Take 1 tablet by mouth every morning.   Yes Historical Provider, MD  vitamin B-12 (CYANOCOBALAMIN) 1000 MCG tablet Take 1,000 mcg by mouth every morning.   Yes Historical Provider, MD  vitamin C (ASCORBIC ACID) 500 MG tablet Take 500 mg by mouth 2 (two) times daily.   Yes Historical Provider, MD  acetaminophen (TYLENOL) 325 MG tablet Take 2 tablets (650 mg total) by mouth every 6 (six) hours as needed for mild pain (or Fever >/= 101). Patient not taking: Reported on 06/06/2014 03/22/14   Lucille Passy Babish, PA-C  ferrous sulfate 325 (65 FE) MG tablet Take 1 tablet (325 mg total) by mouth 3 (three) times daily after meals. Patient not taking: Reported on 06/06/2014 03/22/14   Lucille Passy Babish, PA-C  oxyCODONE 10 MG TABS Take 1-2 tablets (10-20 mg total) by mouth every 4 (four) hours as needed for severe pain. Patient not taking: Reported on 06/06/2014 03/22/14   Lucille Passy Babish, PA-C  tiZANidine (ZANAFLEX) 4 MG tablet Take 1 tablet (4 mg total) by mouth every 6 (six) hours as needed. Patient not taking: Reported on 06/06/2014 03/22/14   Lucille Passy Babish, PA-C    Family History  Problem Relation Age of Onset  . Anesthesia problems Mother     History   Social History  . Marital Status: Married    Spouse Name: N/A    Number of Children: N/A  . Years of Education: N/A   Social History Main Topics  . Smoking status: Never Smoker   . Smokeless tobacco: Never Used  . Alcohol Use: No  . Drug Use: No  . Sexual Activity: None   Other Topics Concern  . None   Social History Narrative    Review of Systems: A 12 point ROS discussed and pertinent positives are indicated in the HPI above.  All other systems are negative.  Review of Systems  Vital Signs: BP 159/83  mmHg  Pulse 68  Temp(Src) 98.2 F (36.8 C) (Oral)  Resp 18  Ht 5\' 3"  (1.6 m)  Wt 155 lb (70.308 kg)  BMI 27.46 kg/m2  SpO2 100%  Physical Exam  Constitutional: She is oriented to person, place, and time. No distress.  HENT:  Head: Normocephalic and atraumatic.  Neck: No tracheal deviation present.  Cardiovascular: Normal rate and regular rhythm.  Exam reveals no gallop and no friction rub.   No murmur heard. Pulmonary/Chest: Effort normal and  breath sounds normal. No respiratory distress. She has no wheezes. She has no rales.  Abdominal: Soft. Bowel sounds are normal. She exhibits no distension. There is no tenderness.  Neurological: She is alert and oriented to person, place, and time.  Skin: She is not diaphoretic.  Back: Lower L4 region TTP, no skin changes  Psychiatric: She has a normal mood and affect. Her behavior is normal. Thought content normal.    Imaging: No results found.  Labs:  CBC:  Recent Labs  12/09/13 0954 03/14/14 1340 03/22/14 0443 03/23/14 0438  WBC 4.8 4.9 6.2 5.9  HGB 11.8* 11.5* 9.8* 9.4*  HCT 36.1 37.3 30.4* 29.4*  PLT 308 244 200 212    COAGS:  Recent Labs  03/14/14 1340  INR 0.99  APTT 29    BMP:  Recent Labs  12/09/13 0954 03/14/14 1340 03/22/14 0443 03/23/14 0438  NA 136* 140 139 140  K 4.0 4.5 4.6 4.0  CL 99 101 104 105  CO2 25 28 25 27   GLUCOSE 110* 141* 136* 108*  BUN 11 24* 12 12  CALCIUM 9.3 9.6 8.9 9.1  CREATININE 0.85 0.94 0.92 0.91  GFRNONAA 63* 56* 58* 58*  GFRAA 74* 65* 67* 68*    LIVER FUNCTION TESTS:  Recent Labs  11/28/13 1220  BILITOT 0.5  AST 30  ALT 39*  ALKPHOS 67  PROT 6.9  ALBUMIN 2.9*   Assessment and Plan: Lumbar Back pain uncontrolled with maximum pain medication Recent imaging revealed acute L4 compression fracture after injury on 05/17/14 Scheduled today for image guided L4 VP/KP with moderate sedation Patient has been NPO, no blood thinners taken, afebrile, labs  reviewed History of laminectomy in 2005 Risks and Benefits discussed with the patient. All of the patient's questions were answered, patient is agreeable to proceed. Consent signed and in chart.   SignedHedy Jacob 06/09/2014, 8:29 AM

## 2014-06-09 NOTE — Discharge Instructions (Signed)
No stooping ,bending or lifting more than 10lbs for 2 weeks. Use walker to ambulate foe 2 weeks. RTC in 2 weeks  KYPHOPLASTY/VERTEBROPLASTY DISCHARGE INSTRUCTIONS  Medications: (check all that apply)     Resume all home medications as before procedure.                        Continue your pain medications as prescribed as needed.  Over the next 3-5 days, decrease your pain medication as tolerated.  Over the counter medications (i.e. Tylenol, ibuprofen, and aleve) may be substituted once severe/moderate pain symptoms have subsided.   Wound Care: - Bandages may be removed the day following your procedure.  You may get your incision wet once bandages are removed.  Bandaids may be used to cover the incisions until scab formation.  Topical ointments are optional.  - If you develop a fever greater than 101 degrees, have increased skin redness at the incision sites or pus-like oozing from incisions occurring within 1 week of the procedure, contact radiology at [phone] or [phone].  - Ice pack to back for 15-20 minutes 2-3 time per day for first 2-3 days post procedure.  The ice will expedite muscle healing and help with the pain from the incisions.   Activity: - Bedrest today with limited activity for 24 hours post procedure.  - No driving for 48 hours.  - Increase your activity as tolerated after bedrest (with assistance if necessary).  - Refrain from any strenuous activity or heavy lifting (greater than 10 lbs.).   Follow up: - Contact radiology at [phone] or [phone] if any questions/concerns.  - A physician assistant from radiology will contact you in approximately 1 week.  - If a biopsy was performed at the time of your procedure, your referring physician should receive the results in usually 2-3 days.

## 2014-06-09 NOTE — Procedures (Signed)
S/P L4 KP with biopsy

## 2014-06-15 ENCOUNTER — Other Ambulatory Visit (HOSPITAL_COMMUNITY): Payer: Self-pay | Admitting: Interventional Radiology

## 2014-06-15 DIAGNOSIS — IMO0002 Reserved for concepts with insufficient information to code with codable children: Secondary | ICD-10-CM

## 2014-06-15 DIAGNOSIS — M549 Dorsalgia, unspecified: Secondary | ICD-10-CM

## 2014-06-23 ENCOUNTER — Ambulatory Visit (HOSPITAL_COMMUNITY)
Admission: RE | Admit: 2014-06-23 | Discharge: 2014-06-23 | Disposition: A | Discharge status: Home / Self Care | Payer: Medicare Other | Source: Ambulatory Visit | Attending: Interventional Radiology | Admitting: Interventional Radiology

## 2014-06-23 DIAGNOSIS — IMO0002 Reserved for concepts with insufficient information to code with codable children: Secondary | ICD-10-CM

## 2014-06-23 DIAGNOSIS — M4856XA Collapsed vertebra, not elsewhere classified, lumbar region, initial encounter for fracture: Secondary | ICD-10-CM | POA: Diagnosis not present

## 2014-06-23 DIAGNOSIS — M549 Dorsalgia, unspecified: Secondary | ICD-10-CM

## 2014-06-28 DIAGNOSIS — M7061 Trochanteric bursitis, right hip: Secondary | ICD-10-CM | POA: Diagnosis not present

## 2014-06-28 DIAGNOSIS — Z4789 Encounter for other orthopedic aftercare: Secondary | ICD-10-CM | POA: Diagnosis not present

## 2014-06-28 DIAGNOSIS — S32040A Wedge compression fracture of fourth lumbar vertebra, initial encounter for closed fracture: Secondary | ICD-10-CM | POA: Diagnosis not present

## 2014-06-28 DIAGNOSIS — M5136 Other intervertebral disc degeneration, lumbar region: Secondary | ICD-10-CM | POA: Diagnosis not present

## 2014-06-28 DIAGNOSIS — Z79891 Long term (current) use of opiate analgesic: Secondary | ICD-10-CM | POA: Diagnosis not present

## 2014-07-06 ENCOUNTER — Telehealth (HOSPITAL_COMMUNITY): Payer: Self-pay | Admitting: Interventional Radiology

## 2014-07-06 NOTE — Telephone Encounter (Signed)
Pt called and stated that she was going to wait on the MRI scan. States she is getting a spinal injection and will call back if that does not help. JM

## 2014-07-14 DIAGNOSIS — M5136 Other intervertebral disc degeneration, lumbar region: Secondary | ICD-10-CM | POA: Diagnosis not present

## 2014-07-19 DIAGNOSIS — Z471 Aftercare following joint replacement surgery: Secondary | ICD-10-CM | POA: Diagnosis not present

## 2014-07-19 DIAGNOSIS — M25561 Pain in right knee: Secondary | ICD-10-CM | POA: Diagnosis not present

## 2014-07-19 DIAGNOSIS — Z96652 Presence of left artificial knee joint: Secondary | ICD-10-CM | POA: Diagnosis not present

## 2014-08-14 ENCOUNTER — Other Ambulatory Visit: Payer: Self-pay | Admitting: Physician Assistant

## 2014-08-14 DIAGNOSIS — Z85828 Personal history of other malignant neoplasm of skin: Secondary | ICD-10-CM | POA: Diagnosis not present

## 2014-08-14 DIAGNOSIS — D485 Neoplasm of uncertain behavior of skin: Secondary | ICD-10-CM | POA: Diagnosis not present

## 2014-08-14 DIAGNOSIS — L739 Follicular disorder, unspecified: Secondary | ICD-10-CM | POA: Diagnosis not present

## 2014-08-14 DIAGNOSIS — Z08 Encounter for follow-up examination after completed treatment for malignant neoplasm: Secondary | ICD-10-CM | POA: Diagnosis not present

## 2014-08-25 DIAGNOSIS — M47816 Spondylosis without myelopathy or radiculopathy, lumbar region: Secondary | ICD-10-CM | POA: Diagnosis not present

## 2014-08-25 DIAGNOSIS — M5136 Other intervertebral disc degeneration, lumbar region: Secondary | ICD-10-CM | POA: Diagnosis not present

## 2014-09-01 DIAGNOSIS — M961 Postlaminectomy syndrome, not elsewhere classified: Secondary | ICD-10-CM | POA: Diagnosis not present

## 2014-09-01 DIAGNOSIS — M47816 Spondylosis without myelopathy or radiculopathy, lumbar region: Secondary | ICD-10-CM | POA: Diagnosis not present

## 2014-10-04 DIAGNOSIS — N200 Calculus of kidney: Secondary | ICD-10-CM | POA: Diagnosis not present

## 2014-10-17 DIAGNOSIS — H43813 Vitreous degeneration, bilateral: Secondary | ICD-10-CM | POA: Diagnosis not present

## 2014-10-17 DIAGNOSIS — H524 Presbyopia: Secondary | ICD-10-CM | POA: Diagnosis not present

## 2014-10-18 DIAGNOSIS — M47816 Spondylosis without myelopathy or radiculopathy, lumbar region: Secondary | ICD-10-CM | POA: Diagnosis not present

## 2014-10-20 DIAGNOSIS — M47816 Spondylosis without myelopathy or radiculopathy, lumbar region: Secondary | ICD-10-CM | POA: Diagnosis not present

## 2014-10-20 DIAGNOSIS — M961 Postlaminectomy syndrome, not elsewhere classified: Secondary | ICD-10-CM | POA: Diagnosis not present

## 2014-11-10 DIAGNOSIS — Z471 Aftercare following joint replacement surgery: Secondary | ICD-10-CM | POA: Diagnosis not present

## 2014-11-10 DIAGNOSIS — Z96652 Presence of left artificial knee joint: Secondary | ICD-10-CM | POA: Diagnosis not present

## 2014-11-21 DIAGNOSIS — Z1231 Encounter for screening mammogram for malignant neoplasm of breast: Secondary | ICD-10-CM | POA: Diagnosis not present

## 2014-11-27 DIAGNOSIS — G894 Chronic pain syndrome: Secondary | ICD-10-CM | POA: Diagnosis not present

## 2014-11-27 DIAGNOSIS — Z79899 Other long term (current) drug therapy: Secondary | ICD-10-CM | POA: Diagnosis not present

## 2014-11-27 DIAGNOSIS — M5126 Other intervertebral disc displacement, lumbar region: Secondary | ICD-10-CM | POA: Diagnosis not present

## 2014-11-27 DIAGNOSIS — Z5181 Encounter for therapeutic drug level monitoring: Secondary | ICD-10-CM | POA: Diagnosis not present

## 2014-11-27 DIAGNOSIS — M4806 Spinal stenosis, lumbar region: Secondary | ICD-10-CM | POA: Diagnosis not present

## 2014-11-27 DIAGNOSIS — M961 Postlaminectomy syndrome, not elsewhere classified: Secondary | ICD-10-CM | POA: Diagnosis not present

## 2014-11-30 DIAGNOSIS — I10 Essential (primary) hypertension: Secondary | ICD-10-CM | POA: Diagnosis not present

## 2014-11-30 DIAGNOSIS — E785 Hyperlipidemia, unspecified: Secondary | ICD-10-CM | POA: Diagnosis not present

## 2014-11-30 DIAGNOSIS — E039 Hypothyroidism, unspecified: Secondary | ICD-10-CM | POA: Diagnosis not present

## 2014-11-30 DIAGNOSIS — M179 Osteoarthritis of knee, unspecified: Secondary | ICD-10-CM | POA: Diagnosis not present

## 2014-11-30 DIAGNOSIS — F329 Major depressive disorder, single episode, unspecified: Secondary | ICD-10-CM | POA: Diagnosis not present

## 2014-12-21 ENCOUNTER — Encounter: Payer: Self-pay | Admitting: Interventional Cardiology

## 2014-12-21 DIAGNOSIS — M5416 Radiculopathy, lumbar region: Secondary | ICD-10-CM | POA: Diagnosis not present

## 2014-12-21 DIAGNOSIS — M5136 Other intervertebral disc degeneration, lumbar region: Secondary | ICD-10-CM | POA: Diagnosis not present

## 2014-12-21 DIAGNOSIS — M961 Postlaminectomy syndrome, not elsewhere classified: Secondary | ICD-10-CM | POA: Diagnosis not present

## 2015-01-25 DIAGNOSIS — F4542 Pain disorder with related psychological factors: Secondary | ICD-10-CM | POA: Diagnosis not present

## 2015-02-22 DIAGNOSIS — Z23 Encounter for immunization: Secondary | ICD-10-CM | POA: Diagnosis not present

## 2015-03-01 ENCOUNTER — Encounter: Payer: Self-pay | Admitting: Interventional Cardiology

## 2015-03-01 ENCOUNTER — Ambulatory Visit (INDEPENDENT_AMBULATORY_CARE_PROVIDER_SITE_OTHER): Payer: Medicare Other | Admitting: Interventional Cardiology

## 2015-03-01 VITALS — BP 140/78 | HR 65 | Ht 63.0 in | Wt 172.0 lb

## 2015-03-01 DIAGNOSIS — E785 Hyperlipidemia, unspecified: Secondary | ICD-10-CM | POA: Diagnosis not present

## 2015-03-01 DIAGNOSIS — I10 Essential (primary) hypertension: Secondary | ICD-10-CM

## 2015-03-01 NOTE — Patient Instructions (Signed)
**Note De-identified  Obfuscation** Medication Instructions:  Same-no changes  Labwork: None  Testing/Procedures: None  Follow-Up: Your physician wants you to follow-up in: 1 year. You will receive a reminder letter in the mail two months in advance. If you don't receive a letter, please call our office to schedule the follow-up appointment.      If you need a refill on your cardiac medications before your next appointment, please call your pharmacy.   

## 2015-03-01 NOTE — Progress Notes (Signed)
Patient ID: Becky Duran, female   DOB: [birth year], 81 y.o.   MRN: [id number]      Cardiology Office Note   Date:  03/01/2015   ID:  Becky Duran, DOB 10/27/1932, MRN [id number]  PCP:  [name], MD    No chief complaint on file. f/u HTN   Wt Readings from Last 3 Encounters:  03/01/15 172 lb (78.019 kg)  06/09/14 155 lb (70.308 kg)  03/21/14 174 lb 9 oz (79.181 kg)       History of Present Illness: Becky Duran is a 81 y.o. female who has had difficult to control BP.BP readings are now controlled well. Walking limited by back pain. Only walking in the grocery store with a cart. She received steroid injections as well at one point but they seemed to stop working.  Now taking Percocet for pain from Oxycontin over the past 8 months.  She will be getting a temporary stimulator for the back, tomorrow.  When she gets a headache, BP tends to be high.    Past Medical History  Diagnosis Date  . GERD (gastroesophageal reflux disease)   . Thyroid disease     hypothyroidism  . Hyperlipidemia   . Endometriosis   . Osteoarthritis   . Osteopenia   . Scoliosis   . Hypothyroidism   . Hypertension   . H/O hiatal hernia     small Hital Hernia  . Psoriasis     Chest, Back and face  . Constipation   . Heel fracture   . Psoriasis     dx'd @10yrs . ago.  . Complication of anesthesia     pt has difficulty at night with BP spiking will experience headache occurs sporactically   . Heart murmur     related to past childhood dx pt not sure of what the disease was  . Pneumonia     hx of   . Depression     began prozac with menopause   . Kidney stones     lithotripsy 03/09/2014  . Headache     when BP spikes  . Anemia     hx of     Past Surgical History  Procedure Laterality Date  . Lipoma excision  2012     Back  . Abdominal hysterectomy    . Cardiac catheterization  2011  . Eye surgery  4 years ago    cataract with lens- bil  . Breast lumpectomy      20 years  ago- bengin  . Nephrolithotomy      for kidney stone- > 2 years.  . Lumbar laminectomy/decompression microdiscectomy  06/04/2011    Procedure: LUMBAR LAMINECTOMY/DECOMPRESSION MICRODISCECTOMY;  Surgeon: Floyce Stakes, MD;  Location: [facility];  Service: Neurosurgery;  Laterality: N/A;  Lumbar Four-Five partial Lumbar Three Laminectomy  . Lithotripsy      03/09/2014  . Colonscopy    . Total knee arthroplasty Left 03/21/2014    Procedure: TOTAL KNEE ARTHROPLASTY;  Surgeon: Mauri Pole, MD;  Location: [facility];  Service: Orthopedics;  Laterality: Left;  left femoral nerve block     Current Outpatient Prescriptions  Medication Sig Dispense Refill  . acetaminophen (TYLENOL) 325 MG tablet Take 2 tablets (650 mg total) by mouth every 6 (six) hours as needed for mild pain (or Fever >/= 101).    Marland Kitchen amLODipine (NORVASC) 5 MG tablet Take 5 mg by mouth every morning.     Marland Kitchen aspirin 81 MG chewable tablet Chew  81 mg by mouth daily.     Marland Kitchen atorvastatin (LIPITOR) 20 MG tablet Take 20 mg by mouth every evening.     Marland Kitchen b complex vitamins tablet Take 1 tablet by mouth every morning.    . Calcium Carbonate-Vitamin D (CALCIUM + D PO) Take 1 tablet by mouth daily.    . clarithromycin (BIAXIN) 500 MG tablet TK 1 T PO 1 HOUR PRIOR TO DENTAL WORK  4  . FLUoxetine (PROZAC) 10 MG tablet Take 10 mg by mouth every morning.     Marland Kitchen levothyroxine (SYNTHROID, LEVOTHROID) 75 MCG tablet Take 75 mcg by mouth daily before breakfast.     . Liniments (BLUE-EMU SUPER STRENGTH) CREA Apply topically.    Marland Kitchen loratadine (CLARITIN) 10 MG tablet Take 10 mg by mouth daily as needed for allergies.     Marland Kitchen losartan (COZAAR) 50 MG tablet Take 50 mg by mouth every evening.     . Multiple Vitamin (MULTIVITAMIN WITH MINERALS) TABS tablet Take 1 tablet by mouth every morning.    Marland Kitchen oxyCODONE 10 MG TABS Take 1-2 tablets (10-20 mg total) by mouth every 4 (four) hours as needed for severe pain. 100 tablet 0  . polyethylene glycol (MIRALAX /  GLYCOLAX) packet Take 17 g by mouth 2 (two) times daily. 14 each 0  . Probiotic Product (PROBIOTIC DAILY PO) Take 1 tablet by mouth every morning.    . ranitidine (ZANTAC) 150 MG tablet Take 150 mg by mouth daily. MORNING    . vitamin B-12 (CYANOCOBALAMIN) 1000 MCG tablet Take 1,000 mcg by mouth every morning.    . vitamin C (ASCORBIC ACID) 500 MG tablet Take 500 mg by mouth 2 (two) times daily.     No current facility-administered medications for this visit.    Allergies:   Ciprofloxacin; Colace; Diclofenac sodium; Etodolac; Penicillins; Relafen; Valacyclovir hcl; Diclofenac sodium; Sulfa drugs cross reactors; and Sulfa antibiotics    Social History:  The patient  reports that she has never smoked. She has never used smokeless tobacco. She reports that she does not drink alcohol or use illicit drugs.   Family History:  The patient's family history includes Anesthesia problems in her mother; Heart attack in her brother; Hypertension in her brother, mother, and sister; Stroke (age of onset: 100) in her maternal grandmother; Stroke (age of onset: 60) in her maternal grandfather.    ROS:  Please see the history of present illness.   Otherwise, review of systems are positive for back pain.  Home BP readings in the 120/80 range.   All other systems are reviewed and negative.    PHYSICAL EXAM: VS:  BP 140/78 mmHg  Pulse 65  Ht 5\' 3"  (1.6 m)  Wt 172 lb (78.019 kg)  BMI 30.48 kg/m2 , BMI Body mass index is 30.48 kg/(m^2). GEN: Well nourished, well developed, in no acute distress HEENT: normal Neck: no JVD, carotid bruits, or masses Cardiac: RRR; 2/6 early murmur,  norubs, or gallops,no edema  Respiratory:  clear to auscultation bilaterally, normal work of breathing GI: soft, nontender, nondistended, + BS MS: no deformity or atrophy Skin: warm and dry, no rash Neuro:  Strength and sensation are intact Psych: euthymic mood, full affect   EKG:   The ekg ordered today demonstrates NSR,  PRWP, no ST segment changes   Recent Labs: 06/09/2014: BUN 21; Creatinine, Ser 0.90; Hemoglobin 12.3; Platelets 198; Potassium 4.1; Sodium 140   Lipid Panel    Component Value Date/Time   CHOL  12/26/2009  0335    167        ATP III CLASSIFICATION:  <200     mg/dL   Desirable  200-239  mg/dL   Borderline High  >=240    mg/dL   High          TRIG 68 12/26/2009 0335   HDL 56 12/26/2009 0335   CHOLHDL 3.0 12/26/2009 0335   VLDL 14 12/26/2009 0335   LDLCALC  12/26/2009 0335    97        Total Cholesterol/HDL:CHD Risk Coronary Heart Disease Risk Table                     Men   Women  1/2 Average Risk   3.4   3.3  Average Risk       5.0   4.4  2 X Average Risk   9.6   7.1  3 X Average Risk  23.4   11.0        Use the calculated Patient Ratio above and the CHD Risk Table to determine the patient's CHD Risk.        ATP III CLASSIFICATION (LDL):  <100     mg/dL   Optimal  100-129  mg/dL   Near or Above                    Optimal  130-159  mg/dL   Borderline  160-189  mg/dL   High  >190     mg/dL   Very High     Other studies Reviewed: Additional studies/ records that were reviewed today with results demonstrating: prior med recommendations. Bystolic caused nightmares.   ASSESSMENT AND PLAN:  1. HTN: COntrolled. COntinue current meds.  Tolerating lower dose of Norvasc with adequate BP control.  When it decreased to 2.5 mg daily, BP increased.  2. Back pain: planned for temporary stimulator tomorrow.  Hopefully, this will alow her to increase exercise. 3. Hyperlipidemia: Continue atorvastatin   Current medicines are reviewed at length with the patient today.  The patient concerns regarding her medicines were addressed.  The following changes have been made:  No change  Labs/ tests ordered today include:  No orders of the defined types were placed in this encounter.    Recommend 150 minutes/week of aerobic exercise Low fat, low carb, high fiber diet  recommended  Disposition:   FU in  1 year   Teresita Madura., MD  03/01/2015 12:01 PM    [facility] [address], [place], [place]  [address] Phone: [phone]; Fax: [phone]

## 2015-03-02 DIAGNOSIS — M4806 Spinal stenosis, lumbar region: Secondary | ICD-10-CM | POA: Diagnosis not present

## 2015-03-02 DIAGNOSIS — M5416 Radiculopathy, lumbar region: Secondary | ICD-10-CM | POA: Diagnosis not present

## 2015-03-02 DIAGNOSIS — M961 Postlaminectomy syndrome, not elsewhere classified: Secondary | ICD-10-CM | POA: Diagnosis not present

## 2015-03-02 DIAGNOSIS — G894 Chronic pain syndrome: Secondary | ICD-10-CM | POA: Diagnosis not present

## 2015-03-07 DIAGNOSIS — Z4789 Encounter for other orthopedic aftercare: Secondary | ICD-10-CM | POA: Diagnosis not present

## 2015-03-26 DIAGNOSIS — M4806 Spinal stenosis, lumbar region: Secondary | ICD-10-CM | POA: Diagnosis not present

## 2015-03-26 DIAGNOSIS — M545 Low back pain: Secondary | ICD-10-CM | POA: Diagnosis not present

## 2015-03-26 DIAGNOSIS — G894 Chronic pain syndrome: Secondary | ICD-10-CM | POA: Diagnosis not present

## 2015-03-26 DIAGNOSIS — M47816 Spondylosis without myelopathy or radiculopathy, lumbar region: Secondary | ICD-10-CM | POA: Diagnosis not present

## 2015-04-18 DIAGNOSIS — E039 Hypothyroidism, unspecified: Secondary | ICD-10-CM | POA: Diagnosis not present

## 2015-04-18 DIAGNOSIS — M5417 Radiculopathy, lumbosacral region: Secondary | ICD-10-CM | POA: Diagnosis not present

## 2015-04-18 DIAGNOSIS — Z87442 Personal history of urinary calculi: Secondary | ICD-10-CM | POA: Diagnosis not present

## 2015-04-18 DIAGNOSIS — Z96659 Presence of unspecified artificial knee joint: Secondary | ICD-10-CM | POA: Diagnosis not present

## 2015-04-18 DIAGNOSIS — E669 Obesity, unspecified: Secondary | ICD-10-CM | POA: Diagnosis not present

## 2015-04-18 DIAGNOSIS — I10 Essential (primary) hypertension: Secondary | ICD-10-CM | POA: Diagnosis not present

## 2015-04-18 DIAGNOSIS — M8580 Other specified disorders of bone density and structure, unspecified site: Secondary | ICD-10-CM | POA: Diagnosis not present

## 2015-04-18 DIAGNOSIS — K219 Gastro-esophageal reflux disease without esophagitis: Secondary | ICD-10-CM | POA: Diagnosis not present

## 2015-04-18 DIAGNOSIS — E785 Hyperlipidemia, unspecified: Secondary | ICD-10-CM | POA: Diagnosis not present

## 2015-04-18 DIAGNOSIS — M961 Postlaminectomy syndrome, not elsewhere classified: Secondary | ICD-10-CM | POA: Diagnosis not present

## 2015-05-29 DIAGNOSIS — M549 Dorsalgia, unspecified: Secondary | ICD-10-CM | POA: Diagnosis present

## 2015-05-29 DIAGNOSIS — K2970 Gastritis, unspecified, without bleeding: Secondary | ICD-10-CM | POA: Diagnosis not present

## 2015-05-29 DIAGNOSIS — N200 Calculus of kidney: Secondary | ICD-10-CM | POA: Diagnosis not present

## 2015-05-29 DIAGNOSIS — N201 Calculus of ureter: Secondary | ICD-10-CM | POA: Diagnosis not present

## 2015-05-29 DIAGNOSIS — Z882 Allergy status to sulfonamides status: Secondary | ICD-10-CM | POA: Diagnosis not present

## 2015-05-29 DIAGNOSIS — Z7982 Long term (current) use of aspirin: Secondary | ICD-10-CM | POA: Diagnosis not present

## 2015-05-29 DIAGNOSIS — R0602 Shortness of breath: Secondary | ICD-10-CM | POA: Diagnosis not present

## 2015-05-29 DIAGNOSIS — F329 Major depressive disorder, single episode, unspecified: Secondary | ICD-10-CM | POA: Diagnosis not present

## 2015-05-29 DIAGNOSIS — N390 Urinary tract infection, site not specified: Secondary | ICD-10-CM | POA: Diagnosis not present

## 2015-05-29 DIAGNOSIS — R0789 Other chest pain: Secondary | ICD-10-CM | POA: Diagnosis not present

## 2015-05-29 DIAGNOSIS — N136 Pyonephrosis: Secondary | ICD-10-CM | POA: Diagnosis not present

## 2015-05-29 DIAGNOSIS — Z79899 Other long term (current) drug therapy: Secondary | ICD-10-CM | POA: Diagnosis not present

## 2015-05-29 DIAGNOSIS — N139 Obstructive and reflux uropathy, unspecified: Secondary | ICD-10-CM | POA: Diagnosis not present

## 2015-05-29 DIAGNOSIS — N3281 Overactive bladder: Secondary | ICD-10-CM | POA: Diagnosis present

## 2015-05-29 DIAGNOSIS — Z880 Allergy status to penicillin: Secondary | ICD-10-CM | POA: Diagnosis not present

## 2015-05-29 DIAGNOSIS — R1013 Epigastric pain: Secondary | ICD-10-CM | POA: Diagnosis not present

## 2015-05-29 DIAGNOSIS — N1330 Unspecified hydronephrosis: Secondary | ICD-10-CM | POA: Diagnosis not present

## 2015-05-29 DIAGNOSIS — N132 Hydronephrosis with renal and ureteral calculous obstruction: Secondary | ICD-10-CM | POA: Diagnosis not present

## 2015-05-29 DIAGNOSIS — E039 Hypothyroidism, unspecified: Secondary | ICD-10-CM | POA: Diagnosis present

## 2015-05-29 DIAGNOSIS — J189 Pneumonia, unspecified organism: Secondary | ICD-10-CM | POA: Diagnosis not present

## 2015-05-29 DIAGNOSIS — N23 Unspecified renal colic: Secondary | ICD-10-CM | POA: Diagnosis not present

## 2015-05-29 DIAGNOSIS — G8929 Other chronic pain: Secondary | ICD-10-CM | POA: Diagnosis not present

## 2015-05-29 DIAGNOSIS — R1032 Left lower quadrant pain: Secondary | ICD-10-CM | POA: Diagnosis not present

## 2015-05-29 DIAGNOSIS — I10 Essential (primary) hypertension: Secondary | ICD-10-CM | POA: Diagnosis not present

## 2015-05-29 DIAGNOSIS — R109 Unspecified abdominal pain: Secondary | ICD-10-CM | POA: Diagnosis not present

## 2015-05-29 DIAGNOSIS — J9811 Atelectasis: Secondary | ICD-10-CM | POA: Diagnosis not present

## 2015-05-29 DIAGNOSIS — N3000 Acute cystitis without hematuria: Secondary | ICD-10-CM | POA: Diagnosis not present

## 2015-05-29 DIAGNOSIS — R110 Nausea: Secondary | ICD-10-CM | POA: Diagnosis not present

## 2015-05-29 DIAGNOSIS — E785 Hyperlipidemia, unspecified: Secondary | ICD-10-CM | POA: Diagnosis present

## 2015-05-29 DIAGNOSIS — Z9889 Other specified postprocedural states: Secondary | ICD-10-CM | POA: Diagnosis not present

## 2015-06-01 DIAGNOSIS — M179 Osteoarthritis of knee, unspecified: Secondary | ICD-10-CM | POA: Diagnosis not present

## 2015-06-01 DIAGNOSIS — I10 Essential (primary) hypertension: Secondary | ICD-10-CM | POA: Diagnosis not present

## 2015-06-01 DIAGNOSIS — E039 Hypothyroidism, unspecified: Secondary | ICD-10-CM | POA: Diagnosis not present

## 2015-06-01 DIAGNOSIS — E785 Hyperlipidemia, unspecified: Secondary | ICD-10-CM | POA: Diagnosis not present

## 2015-06-01 DIAGNOSIS — F329 Major depressive disorder, single episode, unspecified: Secondary | ICD-10-CM | POA: Diagnosis not present

## 2015-06-04 ENCOUNTER — Other Ambulatory Visit: Payer: Self-pay | Admitting: Urology

## 2015-06-04 DIAGNOSIS — N200 Calculus of kidney: Secondary | ICD-10-CM | POA: Diagnosis not present

## 2015-06-04 DIAGNOSIS — Z0000 Encounter for general adult medical examination without abnormal findings: Secondary | ICD-10-CM | POA: Diagnosis not present

## 2015-06-18 ENCOUNTER — Encounter (HOSPITAL_BASED_OUTPATIENT_CLINIC_OR_DEPARTMENT_OTHER): Payer: Self-pay | Admitting: *Deleted

## 2015-06-18 NOTE — Progress Notes (Signed)
NPO AFTER MN.  ARRIVE AT 0730.  NEEDS ISTAT 8  AND EKG .  WILL TAKE NORVASC, SYNTHROID, PROZAC, ZANTAC, AND OXYCODONE AM DOS W/ SIPS OF WATER.

## 2015-06-21 ENCOUNTER — Other Ambulatory Visit: Payer: Self-pay

## 2015-06-21 ENCOUNTER — Ambulatory Visit (HOSPITAL_BASED_OUTPATIENT_CLINIC_OR_DEPARTMENT_OTHER)
Admission: RE | Admit: 2015-06-21 | Discharge: 2015-06-21 | Disposition: A | Discharge status: Home / Self Care | Payer: Medicare Other | Source: Ambulatory Visit | Attending: Urology | Admitting: Urology

## 2015-06-21 ENCOUNTER — Ambulatory Visit (HOSPITAL_BASED_OUTPATIENT_CLINIC_OR_DEPARTMENT_OTHER): Payer: Medicare Other | Admitting: Anesthesiology

## 2015-06-21 ENCOUNTER — Encounter (HOSPITAL_BASED_OUTPATIENT_CLINIC_OR_DEPARTMENT_OTHER): Payer: Self-pay | Admitting: *Deleted

## 2015-06-21 ENCOUNTER — Encounter (HOSPITAL_BASED_OUTPATIENT_CLINIC_OR_DEPARTMENT_OTHER)
Admission: RE | Disposition: A | Discharge status: Home / Self Care | Payer: Self-pay | Source: Ambulatory Visit | Attending: Urology

## 2015-06-21 DIAGNOSIS — M1990 Unspecified osteoarthritis, unspecified site: Secondary | ICD-10-CM | POA: Insufficient documentation

## 2015-06-21 DIAGNOSIS — M8580 Other specified disorders of bone density and structure, unspecified site: Secondary | ICD-10-CM | POA: Diagnosis not present

## 2015-06-21 DIAGNOSIS — I129 Hypertensive chronic kidney disease with stage 1 through stage 4 chronic kidney disease, or unspecified chronic kidney disease: Secondary | ICD-10-CM | POA: Insufficient documentation

## 2015-06-21 DIAGNOSIS — N182 Chronic kidney disease, stage 2 (mild): Secondary | ICD-10-CM | POA: Diagnosis not present

## 2015-06-21 DIAGNOSIS — Z96652 Presence of left artificial knee joint: Secondary | ICD-10-CM | POA: Insufficient documentation

## 2015-06-21 DIAGNOSIS — N202 Calculus of kidney with calculus of ureter: Secondary | ICD-10-CM | POA: Diagnosis not present

## 2015-06-21 DIAGNOSIS — R9431 Abnormal electrocardiogram [ECG] [EKG]: Secondary | ICD-10-CM | POA: Diagnosis not present

## 2015-06-21 DIAGNOSIS — N200 Calculus of kidney: Secondary | ICD-10-CM | POA: Diagnosis not present

## 2015-06-21 DIAGNOSIS — Z90710 Acquired absence of both cervix and uterus: Secondary | ICD-10-CM | POA: Insufficient documentation

## 2015-06-21 DIAGNOSIS — K219 Gastro-esophageal reflux disease without esophagitis: Secondary | ICD-10-CM | POA: Diagnosis not present

## 2015-06-21 DIAGNOSIS — E039 Hypothyroidism, unspecified: Secondary | ICD-10-CM | POA: Insufficient documentation

## 2015-06-21 DIAGNOSIS — K449 Diaphragmatic hernia without obstruction or gangrene: Secondary | ICD-10-CM | POA: Insufficient documentation

## 2015-06-21 DIAGNOSIS — E785 Hyperlipidemia, unspecified: Secondary | ICD-10-CM | POA: Insufficient documentation

## 2015-06-21 DIAGNOSIS — Z79899 Other long term (current) drug therapy: Secondary | ICD-10-CM | POA: Diagnosis not present

## 2015-06-21 HISTORY — PX: HOLMIUM LASER APPLICATION: SHX5852

## 2015-06-21 HISTORY — DX: Chronic kidney disease, stage 2 (mild): N18.2

## 2015-06-21 HISTORY — DX: Low back pain, unspecified: M54.50

## 2015-06-21 HISTORY — PX: CYSTOSCOPY WITH URETEROSCOPY AND STENT PLACEMENT: SHX6377

## 2015-06-21 HISTORY — DX: Calculus of kidney: N20.0

## 2015-06-21 HISTORY — PX: CYSTOSCOPY W/ URETERAL STENT REMOVAL: SHX1430

## 2015-06-21 HISTORY — DX: Presence of spectacles and contact lenses: Z97.3

## 2015-06-21 HISTORY — DX: Opioid use, unspecified, uncomplicated: F11.90

## 2015-06-21 HISTORY — DX: Diaphragmatic hernia without obstruction or gangrene: K44.9

## 2015-06-21 HISTORY — DX: Nocturia: R35.1

## 2015-06-21 HISTORY — DX: Low back pain: M54.5

## 2015-06-21 HISTORY — DX: Personal history of other diseases of the female genital tract: Z87.42

## 2015-06-21 HISTORY — DX: Other chronic pain: G89.29

## 2015-06-21 HISTORY — DX: Personal history of urinary calculi: Z87.442

## 2015-06-21 LAB — POCT I-STAT, CHEM 8
BUN: 14 mg/dL (ref 6–20)
CALCIUM ION: 1.14 mmol/L (ref 1.13–1.30)
CHLORIDE: 107 mmol/L (ref 101–111)
Creatinine, Ser: 0.9 mg/dL (ref 0.44–1.00)
GLUCOSE: 86 mg/dL (ref 65–99)
HCT: 38 % (ref 36.0–46.0)
Hemoglobin: 12.9 g/dL (ref 12.0–15.0)
POTASSIUM: 3.9 mmol/L (ref 3.5–5.1)
Sodium: 142 mmol/L (ref 135–145)
TCO2: 25 mmol/L (ref 0–100)

## 2015-06-21 SURGERY — CYSTOURETEROSCOPY, WITH STENT INSERTION
Anesthesia: General

## 2015-06-21 MED ORDER — IOHEXOL 350 MG/ML SOLN
INTRAVENOUS | Status: DC | PRN
  Administered 2015-06-21: 50 mL via URETHRAL

## 2015-06-21 MED ORDER — OXYCODONE HCL 5 MG PO TABS
10.0000 mg | ORAL_TABLET | Freq: Once | ORAL | Status: AC
  Administered 2015-06-21: 10 mg via ORAL
  Filled 2015-06-21: qty 2

## 2015-06-21 MED ORDER — ONDANSETRON HCL 4 MG/2ML IJ SOLN
INTRAMUSCULAR | Status: DC | PRN
  Administered 2015-06-21: 4 mg via INTRAVENOUS

## 2015-06-21 MED ORDER — FENTANYL CITRATE (PF) 100 MCG/2ML IJ SOLN
INTRAMUSCULAR | Status: DC | PRN
  Administered 2015-06-21 (×2): 25 ug via INTRAVENOUS
  Administered 2015-06-21 (×3): 50 ug via INTRAVENOUS

## 2015-06-21 MED ORDER — ONDANSETRON HCL 4 MG/2ML IJ SOLN
INTRAMUSCULAR | Status: AC
  Filled 2015-06-21: qty 2

## 2015-06-21 MED ORDER — LACTATED RINGERS IV SOLN
INTRAVENOUS | Status: DC
  Administered 2015-06-21: 09:00:00 via INTRAVENOUS
  Filled 2015-06-21: qty 1000

## 2015-06-21 MED ORDER — GENTAMICIN SULFATE 40 MG/ML IJ SOLN
360.0000 mg | INTRAVENOUS | Status: AC
  Administered 2015-06-21: 360 mg via INTRAVENOUS
  Filled 2015-06-21: qty 9

## 2015-06-21 MED ORDER — PROPOFOL 10 MG/ML IV BOLUS
INTRAVENOUS | Status: DC | PRN
  Administered 2015-06-21: 130 mg via INTRAVENOUS

## 2015-06-21 MED ORDER — OXYCODONE HCL 5 MG PO TABS
ORAL_TABLET | ORAL | Status: AC
  Filled 2015-06-21: qty 2

## 2015-06-21 MED ORDER — KETOROLAC TROMETHAMINE 30 MG/ML IJ SOLN
INTRAMUSCULAR | Status: AC
  Filled 2015-06-21: qty 1

## 2015-06-21 MED ORDER — DOXYCYCLINE HYCLATE 100 MG PO TABS: 100.0000 mg | ORAL_TABLET | Freq: Two times a day (BID) | ORAL | Status: DC

## 2015-06-21 MED ORDER — ONDANSETRON HCL 4 MG/2ML IJ SOLN
4.0000 mg | Freq: Once | INTRAMUSCULAR | Status: AC | PRN
  Administered 2015-06-21: 4 mg via INTRAVENOUS
  Filled 2015-06-21: qty 2

## 2015-06-21 MED ORDER — FENTANYL CITRATE (PF) 100 MCG/2ML IJ SOLN
INTRAMUSCULAR | Status: AC
  Filled 2015-06-21: qty 2

## 2015-06-21 MED ORDER — FENTANYL CITRATE (PF) 100 MCG/2ML IJ SOLN
25.0000 ug | INTRAMUSCULAR | Status: DC | PRN
  Administered 2015-06-21 (×2): 50 ug via INTRAVENOUS
  Filled 2015-06-21: qty 1

## 2015-06-21 MED ORDER — LIDOCAINE HCL (CARDIAC) 20 MG/ML IV SOLN
INTRAVENOUS | Status: AC
  Filled 2015-06-21: qty 5

## 2015-06-21 MED ORDER — KETOROLAC TROMETHAMINE 15 MG/ML IJ SOLN
15.0000 mg | Freq: Four times a day (QID) | INTRAMUSCULAR | Status: DC | PRN
  Filled 2015-06-21: qty 1

## 2015-06-21 MED ORDER — EPHEDRINE SULFATE 50 MG/ML IJ SOLN
INTRAMUSCULAR | Status: DC | PRN
  Administered 2015-06-21: 10 mg via INTRAVENOUS

## 2015-06-21 MED ORDER — PROPOFOL 10 MG/ML IV BOLUS
INTRAVENOUS | Status: AC
  Filled 2015-06-21: qty 40

## 2015-06-21 MED ORDER — FENTANYL CITRATE (PF) 100 MCG/2ML IJ SOLN
INTRAMUSCULAR | Status: AC
  Filled 2015-06-21: qty 4

## 2015-06-21 MED ORDER — DEXAMETHASONE SODIUM PHOSPHATE 4 MG/ML IJ SOLN
INTRAMUSCULAR | Status: DC | PRN
  Administered 2015-06-21: 10 mg via INTRAVENOUS

## 2015-06-21 MED ORDER — DEXAMETHASONE SODIUM PHOSPHATE 10 MG/ML IJ SOLN
INTRAMUSCULAR | Status: AC
  Filled 2015-06-21: qty 2

## 2015-06-21 MED ORDER — LIDOCAINE HCL (CARDIAC) 20 MG/ML IV SOLN
INTRAVENOUS | Status: DC | PRN
  Administered 2015-06-21: 50 mg via INTRAVENOUS

## 2015-06-21 MED ORDER — GENTAMICIN SULFATE 40 MG/ML IJ SOLN
5.0000 mg/kg | INTRAVENOUS | Status: DC
  Filled 2015-06-21: qty 9.75

## 2015-06-21 MED ORDER — OXYBUTYNIN CHLORIDE 5 MG PO TABS
5.0000 mg | ORAL_TABLET | Freq: Once | ORAL | Status: AC
  Administered 2015-06-21: 5 mg via ORAL
  Filled 2015-06-21: qty 1

## 2015-06-21 MED ORDER — OXYBUTYNIN CHLORIDE 5 MG PO TABS
ORAL_TABLET | ORAL | Status: AC
  Filled 2015-06-21: qty 1

## 2015-06-21 SURGICAL SUPPLY — 41 items
ADAPTER CATH URET PLST 4-6FR (CATHETERS) IMPLANT
ADPR CATH URET STRL DISP 4-6FR (CATHETERS)
BAG DRAIN URO-CYSTO SKYTR STRL (DRAIN) ×4 IMPLANT
BAG DRN UROCATH (DRAIN) ×2
BASKET DAKOTA 1.9FR 11X120 (BASKET) ×12 IMPLANT
BASKET LASER NITINOL 1.9FR (BASKET) IMPLANT
BASKET STNLS GEMINI 4WIRE 3FR (BASKET) IMPLANT
BASKET ZERO TIP NITINOL 2.4FR (BASKET) IMPLANT
BSKT STON RTRVL 120 1.9FR (BASKET)
BSKT STON RTRVL GEM 120X11 3FR (BASKET)
BSKT STON RTRVL ZERO TP 2.4FR (BASKET)
CATH INTERMIT  6FR 70CM (CATHETERS) ×4 IMPLANT
CATH URET 5FR 28IN CONE TIP (BALLOONS)
CATH URET 5FR 28IN OPEN ENDED (CATHETERS) IMPLANT
CATH URET 5FR 70CM CONE TIP (BALLOONS) IMPLANT
CLOTH BEACON ORANGE TIMEOUT ST (SAFETY) ×4 IMPLANT
ELECT REM PT RETURN 9FT ADLT (ELECTROSURGICAL)
ELECTRODE REM PT RTRN 9FT ADLT (ELECTROSURGICAL) IMPLANT
FIBER LASER FLEXIVA 200 (UROLOGICAL SUPPLIES) ×4 IMPLANT
GLOVE BIO SURGEON STRL SZ8 (GLOVE) ×4 IMPLANT
GOWN STRL REUS W/ TWL LRG LVL3 (GOWN DISPOSABLE) ×2 IMPLANT
GOWN STRL REUS W/ TWL XL LVL3 (GOWN DISPOSABLE) ×2 IMPLANT
GOWN STRL REUS W/TWL LRG LVL3 (GOWN DISPOSABLE) ×4
GOWN STRL REUS W/TWL XL LVL3 (GOWN DISPOSABLE) ×4
GUIDEWIRE 0.038 PTFE COATED (WIRE) IMPLANT
GUIDEWIRE ANG ZIPWIRE 038X150 (WIRE) IMPLANT
GUIDEWIRE STR DUAL SENSOR (WIRE) ×4 IMPLANT
IV NS IRRIG 3000ML ARTHROMATIC (IV SOLUTION) ×8 IMPLANT
KIT BALLIN UROMAX 15FX10 (LABEL) IMPLANT
KIT BALLN UROMAX 15FX4 (MISCELLANEOUS) IMPLANT
KIT BALLN UROMAX 26 75X4 (MISCELLANEOUS)
KIT ROOM TURNOVER WOR (KITS) ×4 IMPLANT
LASER FIBER DISP (UROLOGICAL SUPPLIES) IMPLANT
MANIFOLD NEPTUNE II (INSTRUMENTS) IMPLANT
NS IRRIG 500ML POUR BTL (IV SOLUTION) IMPLANT
PACK CYSTO (CUSTOM PROCEDURE TRAY) ×4 IMPLANT
SET HIGH PRES BAL DIL (LABEL)
SHEATH ACCESS URETERAL 38CM (SHEATH) ×4 IMPLANT
STENT URET 6FRX24 CONTOUR (STENTS) ×8 IMPLANT
TUBE CONNECTING 12'X1/4 (SUCTIONS)
TUBE CONNECTING 12X1/4 (SUCTIONS) IMPLANT

## 2015-06-21 NOTE — Discharge Instructions (Signed)
POSTOPERATIVE CARE AFTER URETEROSCOPY  Stent management  *Stents are often left in after ureteroscopy and stone treatment. If left in, they often cause urinary frequency, urgency, occasional blood in the urine, as well as flank discomfort with urination. These are all expected issues, and should resolve after the stent is removed. *Often times, a small thread is left on the end of the stent, and brought out through the urethra. If so, this is used to remove the stent, making it unnecessary to look in the bladder with a scope in the office to remove the stent. If a thread is left on, did not pull on it until instructed. It is okay to pull the thread to remove both stents on Monday morning.  Diet  Once you have adequately recovered from anesthesia, you may gradually advance your diet, as tolerated, to your regular diet.  Activities  You may gradually increase your activities to your normal unrestricted level the day following your procedure.  Medications  You should resume all preoperative medications. If you are on aspirin-like compounds, you should not resume these until the blood clears from your urine. If given an antibiotic by the surgeon, take these until they are completed. You may also be given, if you have a stent, medications to decrease the urinary frequency and urgency.  Pain  After ureteroscopy, there may be some pain on the side of the scope. Take your pain medicine for this. Usually, this pain resolves within a day or 2.  Fever  Please report any fever over 100 to the doctor.  Post Anesthesia Home Care Instructions  Activity: Get plenty of rest for the remainder of the day. A responsible adult should stay with you for 24 hours following the procedure.  For the next 24 hours, DO NOT: -Drive a car -Paediatric nurse -Drink alcoholic beverages -Take any medication unless instructed by your physician -Make any legal decisions or sign important papers.  Meals: Start  with liquid foods such as gelatin or soup. Progress to regular foods as tolerated. Avoid greasy, spicy, heavy foods. If nausea and/or vomiting occur, drink only clear liquids until the nausea and/or vomiting subsides. Call your physician if vomiting continues.  Special Instructions/Symptoms: Your throat may feel dry or sore from the anesthesia or the breathing tube placed in your throat during surgery. If this causes discomfort, gargle with warm salt water. The discomfort should disappear within 24 hours.

## 2015-06-21 NOTE — H&P (Signed)
H&P  Chief Complaint: Kidney stones  History of Present Illness: Becky Duran is a 82 y.o. year old female who presents for ureteroscopic management of bilateral ureteral/renal calculi. She presented to the emergency room at [place] on January 24 with bilateral flank pain and was found to have a urinary tract infection. She was also found to have bilateral obstructing UPJ/ureteral stones. Dr. Barnie Del place stents bilaterally urgently, and she was treated with IV antibody swelling the hospital. She has had her infection properly treated with Levaquin, and is now on suppressive nitrofurantoin. She presents for management of her stones with ureteroscopy, holmium laser and extraction.  Her only complaint recently is stent pain/urgency/frequency.  Past Medical History  Diagnosis Date  . GERD (gastroesophageal reflux disease)   . Hyperlipidemia   . Osteoarthritis   . Osteopenia   . Scoliosis   . Hypothyroidism   . Psoriasis   . Depression   . Hiatal hernia   . Renal calculus, bilateral   . History of kidney stones   . CKD (chronic kidney disease), stage II   . History of endometriosis   . Heart murmur   . Wears contact lenses     right eye only  . Chronic low back pain   . Narcotic drug use   . Nocturia   . Hypertension cardiologist-  dr Irish Lack    pt states intermittant bp spike at night cause headache    Past Surgical History  Procedure Laterality Date  . Lipoma excision  02-05-2011    Back  . Lumbar laminectomy/decompression microdiscectomy  06/04/2011    Procedure: LUMBAR LAMINECTOMY/DECOMPRESSION MICRODISCECTOMY;  Surgeon: Floyce Stakes, MD;  Location: [facility];  Service: Neurosurgery;  Laterality: N/A;  Lumbar Four-Five partial Lumbar Three Laminectomy  . Colonscopy    . Total knee arthroplasty Left 03/21/2014    Procedure: TOTAL KNEE ARTHROPLASTY;  Surgeon: Mauri Pole, MD;  Location: [facility];  Service: Orthopedics;  Laterality: Left;  left femoral nerve  block  . Extracorporeal shock wave lithotripsy  03-09-2014  . Cataract extraction w/ intraocular lens  implant, bilateral  2001  . Percutaneous nephrostolithotomy Left 09-19-2009  . Cardiac catheterization  01-23-2010  dr Irish Lack    no hemodynamically significant CAD/  normal LVF  . Transthoracic echocardiogram  12-28-2009    normal LVF, ef 60-65%/  mild AV sclerosis without stenosis/  mild AR, MR and TR/  trace PR  . Cardiovascular stress test  08-16-2008  dr Irish Lack    normal nuclear perfusion study/  no ischemia/  normal LV function and wall motion, ef 94%  . Breast lumpectomy  1990's     bengin  . Abdominal hysterectomy  age 6    Home Medications:  No prescriptions prior to admission    Allergies:  Allergies  Allergen Reactions  . Ciprofloxacin Other (See Comments)    Metallic taste   . Colace [Docusate Sodium] Rash  . Etodolac Other (See Comments)    Spiked blood pressure   . Penicillins Hives  . Relafen [Nabumetone] Other (See Comments)    Unknown   . Valacyclovir Hcl Other (See Comments)    Possible confusion   . Diclofenac Sodium Other (See Comments)    Afraid bp would spike  . Sulfa Antibiotics Diarrhea    Family History  Problem Relation Age of Onset  . Anesthesia problems Mother   . Heart attack Brother     76  . Hypertension Sister     X87  .  Hypertension Brother     X2  . Hypertension Mother   . Stroke Maternal Grandmother 72  . Stroke Maternal Grandfather 68    Social History:  reports that she has never smoked. She has never used smokeless tobacco. She reports that she does not drink alcohol or use illicit drugs.  ROS: A complete review of systems was performed.  All systems are negative except for pertinent findings as noted.  Physical Exam:  Vital signs in last 24 hours:   General:  Alert and oriented, No acute distress HEENT: Normocephalic, atraumatic Neck: No JVD or lymphadenopathy Cardiovascular: Regular rate and rhythm Lungs:  Clear bilaterally Abdomen: Soft, nontender, nondistended, no abdominal masses Back: No CVA tenderness Extremities: No edema Neurologic: Grossly intact  Laboratory Data:  No results found for this or any previous visit (from the past 24 hour(s)). No results found for this or any previous visit (from the past 240 hour(s)). Creatinine: No results for input(s): CREATININE in the last 168 hours.  Radiologic Imaging: No results found.  Impression/Assessment:  Bilateral UPJ/ureteral calculi, the largest of which is 10 mm. She has double-J stents in bilaterally.  Plan:  Cystoscopy, bilateral double-J stent extractions, bilateral retrograde ureteropyelograms, bilateral ureteroscopy, holmium laser and extraction of ureteral stones, possible bilateral double-J stents.  Jorja Loa 06/21/2015, 7:29 AM  Lillette Boxer. Tenesia Escudero MD

## 2015-06-21 NOTE — Anesthesia Preprocedure Evaluation (Addendum)
Anesthesia Evaluation  Patient identified by MRN, date of birth, ID band Patient awake    Reviewed: Allergy & Precautions, NPO status , Patient's Chart, lab work & pertinent test results  History of Anesthesia Complications (+) Emergence Delirium and history of anesthetic complications  Airway Mallampati: II  TM Distance: >3 FB Neck ROM: Full    Dental  (+) Teeth Intact, Dental Advisory Given, Caps,    Pulmonary neg pulmonary ROS,    Pulmonary exam normal breath sounds clear to auscultation       Cardiovascular hypertension, Pt. on medications Normal cardiovascular exam Rhythm:Regular Rate:Normal     Neuro/Psych PSYCHIATRIC DISORDERS Depression negative neurological ROS     GI/Hepatic Neg liver ROS, hiatal hernia, GERD (controlled with OTC meds)  Controlled and Medicated,  Endo/Other  negative endocrine ROSHypothyroidism   Renal/GU Renal InsufficiencyRenal disease     Musculoskeletal  (+) Arthritis , Osteoarthritis,    Abdominal   Peds  Hematology negative hematology ROS (+)   Anesthesia Other Findings Day of surgery medications reviewed with the patient.  Reproductive/Obstetrics                           Anesthesia Physical Anesthesia Plan  ASA: II  Anesthesia Plan: General   Post-op Pain Management:    Induction: Intravenous  Airway Management Planned: LMA  Additional Equipment:   Intra-op Plan:   Post-operative Plan: Extubation in OR  Informed Consent: I have reviewed the patients History and Physical, chart, labs and discussed the procedure including the risks, benefits and alternatives for the proposed anesthesia with the patient or authorized representative who has indicated his/her understanding and acceptance.   Dental advisory given  Plan Discussed with: CRNA  Anesthesia Plan Comments: (Risks/benefits of general anesthesia discussed with patient including risk of  damage to teeth, lips, gum, and tongue, nausea/vomiting, allergic reactions to medications, and the possibility of heart attack, stroke and death.  All patient questions answered.  Patient wishes to proceed.)       Anesthesia Quick Evaluation

## 2015-06-21 NOTE — Transfer of Care (Signed)
Immediate Anesthesia Transfer of Care Note  Patient: Becky Duran  Procedure(s) Performed: Procedure(s): CYSTOSCOPY WITH URETEROSCOPY AND RIGHT STENT PLACEMENT (N/A) HOLMIUM LASER APPLICATION (N/A) CYSTOSCOPY WITH STENT REMOVAL (N/A)  Patient Location: PACU  Anesthesia Type:General  Level of Consciousness: awake, alert , oriented and patient cooperative  Airway & Oxygen Therapy: Patient Spontanous Breathing and Patient connected to nasal cannula oxygen  Post-op Assessment: Report given to RN and Post -op Vital signs reviewed and stable  Post vital signs: Reviewed and stable  Last Vitals:  Filed Vitals:   06/21/15 0750  BP: 110/67  Pulse: 79  Temp: 36.4 C  Resp: 14    Complications: No apparent anesthesia complications

## 2015-06-21 NOTE — Anesthesia Procedure Notes (Signed)
Procedure Name: LMA Insertion Date/Time: 06/21/2015 9:16 AM Performed by: Wanita Chamberlain Pre-anesthesia Checklist: Patient identified, Timeout performed, Emergency Drugs available, Suction available and Patient being monitored Patient Re-evaluated:Patient Re-evaluated prior to inductionOxygen Delivery Method: Circle system utilized Preoxygenation: Pre-oxygenation with 100% oxygen Intubation Type: IV induction Ventilation: Mask ventilation without difficulty LMA: LMA inserted LMA Size: 4.0 Number of attempts: 1 Placement Confirmation: positive ETCO2 and breath sounds checked- equal and bilateral Tube secured with: Tape Dental Injury: Teeth and Oropharynx as per pre-operative assessment

## 2015-06-21 NOTE — Progress Notes (Signed)
15mg  Toradol given IV as per order, unable to chart on [facility]

## 2015-06-21 NOTE — Anesthesia Postprocedure Evaluation (Signed)
Anesthesia Post Note  Patient: Becky Duran  Procedure(s) Performed: Procedure(s) (LRB): CYSTOSCOPY WITH URETEROSCOPY AND RIGHT STENT PLACEMENT (N/A) HOLMIUM LASER APPLICATION (N/A) CYSTOSCOPY WITH STENT REMOVAL (N/A)  Patient location during evaluation: PACU Anesthesia Type: General Level of consciousness: awake and alert Pain management: pain level controlled Vital Signs Assessment: post-procedure vital signs reviewed and stable Respiratory status: spontaneous breathing, nonlabored ventilation, respiratory function stable and patient connected to nasal cannula oxygen Cardiovascular status: blood pressure returned to baseline and stable Postop Assessment: no signs of nausea or vomiting Anesthetic complications: no    Last Vitals:  Filed Vitals:   06/21/15 1100 06/21/15 1335  BP: 137/64 117/60  Pulse: 95 89  Temp:  36.8 C  Resp: 18 16    Last Pain:  Filed Vitals:   06/21/15 1340  PainSc: 7                  Catalina Gravel

## 2015-06-21 NOTE — Op Note (Signed)
Preoperative diagnosis: Bilateral UPJ/renal stones  Postoperative diagnosis: Same  Principal procedure cystoscopy, bilateral double-J stent extractions, bilateral retrograde ureteropyelograms with interpretive fluoroscopy, bilateral ureteroscopy, holmium laser and extraction of bilateral renal calculi  Surgeon: Terrin Imparato  Anesthesia: Gen. with LMA  Complications: None  Specimens: Stones, to the patient's family  Drains:  Indications: 82 year old female with history of recent urinary tract infection and bilateral obstructing ureteral/UPJ stones. She was stented urgently in [place] by Dr. Noemi Chapel. She recently saw me for follow-up. The patient has had stents remaining, has had her infection adequately treated, and returns now for definitive management of her ureteral calculi.  Description of procedure: The patient was properly identified in the holding area and received preoperative IV antibiotics/gentamicin. She was taken to the operating room where general anesthetic was administered. She was placed in the dorsolithotomy position. Genitalia and perineum were prepped and draped. Proper timeout was performed.  The 29 French scope was admitted into her bladder.  The bladder was normal except for presence of bilateral double-J stents.  Both of these were easily extracted.  Bilateral retrograde ureteropyelograms were then performed.  A 6 French open-ended catheter was used to perform these.  Omnipaque was the contrast agent injected.  Both ureters were normal.  There was no evidence of filling defects in either ureter.  There was no stricture or hydroureter.  Mild filling defects in the renal pelves bilaterally consistent with stones.  A ureteral access sheath was admitted in the right ureter, over a sensor tip guidewire.  The core was removed.  The 6 French flexible scope was used to traverse the access sheath into the right renal pelvis or a small stone was identified, grasped, and  extracted through the access sheath.  Careful identification/inspection of each calyx revealed no other stones present.  The ureteroscope and access sheath were then removed.  The access sheath was similarly placed in the left ureter over the sensor tip guidewire, and once the core was removed, the 6 French ureteroscope was advanced into the proximal ureter and renal pelvis.  Careful inspection of the calyces and the pelvis was performed.  A larger stone was seen, which was fragmented using the 200  fiber and the holmium laser to apply energy to the stone, rendering it it into smaller fragments which were then extracted easily with the [place] basket.  No further stones were seen.  At this point, the ureteroscope and access sheath were removed.  Because of the patient's procedure, bilateral 6 [place] by 24 cm contour double-J stents were placed, with the string left on the end of each one.  The strings were tied together.  The bladder was drained and the scope removed.  Procedure was then completed, the patient having tolerated it well.

## 2015-06-23 ENCOUNTER — Encounter (HOSPITAL_COMMUNITY): Payer: Self-pay | Admitting: Emergency Medicine

## 2015-06-23 ENCOUNTER — Emergency Department (HOSPITAL_COMMUNITY)
Admission: EM | Admit: 2015-06-23 | Discharge: 2015-06-23 | Disposition: A | Discharge status: Home / Self Care | Payer: Medicare Other | Attending: Emergency Medicine | Admitting: Emergency Medicine

## 2015-06-23 ENCOUNTER — Emergency Department (HOSPITAL_COMMUNITY)
Admission: EM | Admit: 2015-06-23 | Discharge: 2015-06-23 | Disposition: A | Discharge status: Home / Self Care | Payer: Medicare Other | Source: Home / Self Care | Attending: Emergency Medicine | Admitting: Emergency Medicine

## 2015-06-23 ENCOUNTER — Emergency Department (HOSPITAL_COMMUNITY): Payer: Medicare Other

## 2015-06-23 DIAGNOSIS — M1990 Unspecified osteoarthritis, unspecified site: Secondary | ICD-10-CM

## 2015-06-23 DIAGNOSIS — N200 Calculus of kidney: Secondary | ICD-10-CM

## 2015-06-23 DIAGNOSIS — N182 Chronic kidney disease, stage 2 (mild): Secondary | ICD-10-CM

## 2015-06-23 DIAGNOSIS — K219 Gastro-esophageal reflux disease without esophagitis: Secondary | ICD-10-CM

## 2015-06-23 DIAGNOSIS — Y658 Other specified misadventures during surgical and medical care: Secondary | ICD-10-CM | POA: Diagnosis not present

## 2015-06-23 DIAGNOSIS — N132 Hydronephrosis with renal and ureteral calculous obstruction: Secondary | ICD-10-CM | POA: Diagnosis not present

## 2015-06-23 DIAGNOSIS — E039 Hypothyroidism, unspecified: Secondary | ICD-10-CM

## 2015-06-23 DIAGNOSIS — Z7982 Long term (current) use of aspirin: Secondary | ICD-10-CM | POA: Insufficient documentation

## 2015-06-23 DIAGNOSIS — E785 Hyperlipidemia, unspecified: Secondary | ICD-10-CM | POA: Insufficient documentation

## 2015-06-23 DIAGNOSIS — M419 Scoliosis, unspecified: Secondary | ICD-10-CM | POA: Diagnosis not present

## 2015-06-23 DIAGNOSIS — G8929 Other chronic pain: Secondary | ICD-10-CM | POA: Insufficient documentation

## 2015-06-23 DIAGNOSIS — R1031 Right lower quadrant pain: Secondary | ICD-10-CM | POA: Diagnosis not present

## 2015-06-23 DIAGNOSIS — R109 Unspecified abdominal pain: Secondary | ICD-10-CM

## 2015-06-23 DIAGNOSIS — Z880 Allergy status to penicillin: Secondary | ICD-10-CM | POA: Insufficient documentation

## 2015-06-23 DIAGNOSIS — T83122A Displacement of urinary stent, initial encounter: Secondary | ICD-10-CM | POA: Insufficient documentation

## 2015-06-23 DIAGNOSIS — R011 Cardiac murmur, unspecified: Secondary | ICD-10-CM | POA: Insufficient documentation

## 2015-06-23 DIAGNOSIS — Z872 Personal history of diseases of the skin and subcutaneous tissue: Secondary | ICD-10-CM

## 2015-06-23 DIAGNOSIS — I129 Hypertensive chronic kidney disease with stage 1 through stage 4 chronic kidney disease, or unspecified chronic kidney disease: Secondary | ICD-10-CM | POA: Insufficient documentation

## 2015-06-23 DIAGNOSIS — Z79899 Other long term (current) drug therapy: Secondary | ICD-10-CM

## 2015-06-23 DIAGNOSIS — Z9889 Other specified postprocedural states: Secondary | ICD-10-CM | POA: Diagnosis not present

## 2015-06-23 DIAGNOSIS — Z792 Long term (current) use of antibiotics: Secondary | ICD-10-CM | POA: Insufficient documentation

## 2015-06-23 DIAGNOSIS — R10A0 Flank pain, unspecified side: Secondary | ICD-10-CM

## 2015-06-23 DIAGNOSIS — Z973 Presence of spectacles and contact lenses: Secondary | ICD-10-CM | POA: Insufficient documentation

## 2015-06-23 DIAGNOSIS — M8580 Other specified disorders of bone density and structure, unspecified site: Secondary | ICD-10-CM

## 2015-06-23 DIAGNOSIS — Z8742 Personal history of other diseases of the female genital tract: Secondary | ICD-10-CM | POA: Diagnosis not present

## 2015-06-23 DIAGNOSIS — Z466 Encounter for fitting and adjustment of urinary device: Secondary | ICD-10-CM | POA: Diagnosis not present

## 2015-06-23 DIAGNOSIS — F329 Major depressive disorder, single episode, unspecified: Secondary | ICD-10-CM | POA: Insufficient documentation

## 2015-06-23 DIAGNOSIS — R1032 Left lower quadrant pain: Secondary | ICD-10-CM | POA: Diagnosis not present

## 2015-06-23 DIAGNOSIS — Z87442 Personal history of urinary calculi: Secondary | ICD-10-CM | POA: Insufficient documentation

## 2015-06-23 DIAGNOSIS — R339 Retention of urine, unspecified: Secondary | ICD-10-CM | POA: Diagnosis present

## 2015-06-23 LAB — URINALYSIS, ROUTINE W REFLEX MICROSCOPIC
Glucose, UA: 100 mg/dL — AB
Ketones, ur: 15 mg/dL — AB
NITRITE: NEGATIVE
PH: 6.5 (ref 5.0–8.0)
Protein, ur: >300 mg/dL — AB
SPECIFIC GRAVITY, URINE: 1.016 (ref 1.005–1.030)

## 2015-06-23 LAB — CBC WITH DIFFERENTIAL/PLATELET
Basophils Absolute: 0 10*3/uL (ref 0.0–0.1)
Basophils Relative: 0 %
EOS ABS: 0.3 10*3/uL (ref 0.0–0.7)
Eosinophils Relative: 3 %
HEMATOCRIT: 39 % (ref 36.0–46.0)
HEMOGLOBIN: 12.6 g/dL (ref 12.0–15.0)
LYMPHS ABS: 2.1 10*3/uL (ref 0.7–4.0)
LYMPHS PCT: 18 %
MCH: 30.5 pg (ref 26.0–34.0)
MCHC: 32.3 g/dL (ref 30.0–36.0)
MCV: 94.4 fL (ref 78.0–100.0)
Monocytes Absolute: 0.9 10*3/uL (ref 0.1–1.0)
Monocytes Relative: 8 %
NEUTROS ABS: 8.5 10*3/uL — AB (ref 1.7–7.7)
NEUTROS PCT: 71 %
Platelets: 290 10*3/uL (ref 150–400)
RBC: 4.13 MIL/uL (ref 3.87–5.11)
RDW: 14 % (ref 11.5–15.5)
WBC: 11.8 10*3/uL — AB (ref 4.0–10.5)

## 2015-06-23 LAB — BASIC METABOLIC PANEL
ANION GAP: 18 — AB (ref 5–15)
BUN: 19 mg/dL (ref 6–20)
CHLORIDE: 103 mmol/L (ref 101–111)
CO2: 19 mmol/L — AB (ref 22–32)
Calcium: 9.8 mg/dL (ref 8.9–10.3)
Creatinine, Ser: 1.18 mg/dL — ABNORMAL HIGH (ref 0.44–1.00)
GFR calc non Af Amer: 42 mL/min — ABNORMAL LOW (ref >60)
GFR, EST AFRICAN AMERICAN: 49 mL/min — AB (ref >60)
Glucose, Bld: 137 mg/dL — ABNORMAL HIGH (ref 65–99)
POTASSIUM: 3.8 mmol/L (ref 3.5–5.1)
SODIUM: 140 mmol/L (ref 135–145)

## 2015-06-23 LAB — URINE MICROSCOPIC-ADD ON: SQUAMOUS EPITHELIAL / LPF: NONE SEEN

## 2015-06-23 MED ORDER — ONDANSETRON HCL 4 MG/2ML IJ SOLN: 4.0000 mg | Freq: Once | INTRAMUSCULAR | Status: DC

## 2015-06-23 MED ORDER — MORPHINE SULFATE (PF) 4 MG/ML IV SOLN: 4.0000 mg | Freq: Once | INTRAVENOUS | Status: DC

## 2015-06-23 MED ORDER — FENTANYL CITRATE (PF) 100 MCG/2ML IJ SOLN
INTRAMUSCULAR | Status: AC
  Filled 2015-06-23: qty 2

## 2015-06-23 MED ORDER — ONDANSETRON HCL 4 MG/2ML IJ SOLN
4.0000 mg | Freq: Once | INTRAMUSCULAR | Status: AC
  Administered 2015-06-23: 4 mg via INTRAVENOUS

## 2015-06-23 MED ORDER — ACETAMINOPHEN 10 MG/ML IV SOLN
1000.0000 mg | Freq: Four times a day (QID) | INTRAVENOUS | Status: DC
  Administered 2015-06-23: 1000 mg via INTRAVENOUS
  Filled 2015-06-23 (×4): qty 100

## 2015-06-23 MED ORDER — ACETAMINOPHEN 10 MG/ML IV SOLN
1000.0000 mg | Freq: Once | INTRAVENOUS | Status: DC
  Filled 2015-06-23: qty 100

## 2015-06-23 MED ORDER — FENTANYL CITRATE (PF) 100 MCG/2ML IJ SOLN
50.0000 ug | Freq: Once | INTRAMUSCULAR | Status: AC
  Administered 2015-06-23: 50 ug via INTRAVENOUS

## 2015-06-23 MED ORDER — ONDANSETRON HCL 4 MG/2ML IJ SOLN
INTRAMUSCULAR | Status: AC
  Filled 2015-06-23: qty 2

## 2015-06-23 MED ORDER — FENTANYL CITRATE (PF) 100 MCG/2ML IJ SOLN
50.0000 ug | Freq: Once | INTRAMUSCULAR | Status: AC
  Administered 2015-06-23: 50 ug via INTRAVENOUS
  Filled 2015-06-23: qty 2

## 2015-06-23 MED ORDER — ACETAMINOPHEN 10 MG/ML IV SOLN: 1000.0000 mg | Freq: Four times a day (QID) | INTRAVENOUS | Status: DC

## 2015-06-23 MED ORDER — SODIUM CHLORIDE 0.9 % IV SOLN
INTRAVENOUS | Status: DC
  Administered 2015-06-23: 12:00:00 via INTRAVENOUS

## 2015-06-23 NOTE — ED Provider Notes (Signed)
CSN: [id number]     Arrival date & time 06/23/15  1726 History   First MD Initiated Contact with Patient 06/23/15 1815     Chief Complaint  Patient presents with  . Flank Pain    right     (Consider location/radiation/quality/duration/timing/severity/associated sxs/prior Treatment) HPI 82 year old female who presents with bilateral flank pain. Was recently seen in the emergency department this morning for bilateral flank pain after pulling out her ureteral stents today. She has a recent history of urinary tract infection complicated by bilateral obstructing renal stones requiring urgent ureteral stenting. 3 days ago underwent stent replacement by Dr. Diona Fanti, and noted that she was having persistent pain in her bilateral flank. This morning he had pulled out her stents herself, and presented to the ED due to worsening pain. She had a CT renal stone study that showed no new obstructing stones and some mild residual hydronephrosis from her stents. Her UA was not suggestive of infection and she had stable kidney function. Previous physician had spoken to urologist on-call who recommended outpatient follow-up as she did not require restenting. States that her pain was well controlled in the ED with IV medications. Upon returning home she had recurrence of her pain. At around 5 PM took 10 mg of oxycodone, without significant relief to return to the ED for further management. Past Medical History  Diagnosis Date  . GERD (gastroesophageal reflux disease)   . Hyperlipidemia   . Osteoarthritis   . Osteopenia   . Scoliosis   . Hypothyroidism   . Psoriasis   . Depression   . Hiatal hernia   . Renal calculus, bilateral   . History of kidney stones   . CKD (chronic kidney disease), stage II   . History of endometriosis   . Heart murmur   . Wears contact lenses     right eye only  . Chronic low back pain   . Narcotic drug use   . Nocturia   . Hypertension cardiologist-  dr Irish Lack    pt  states intermittant bp spike at night cause headache   Past Surgical History  Procedure Laterality Date  . Lipoma excision  02-05-2011    Back  . Lumbar laminectomy/decompression microdiscectomy  06/04/2011    Procedure: LUMBAR LAMINECTOMY/DECOMPRESSION MICRODISCECTOMY;  Surgeon: Floyce Stakes, MD;  Location: [facility];  Service: Neurosurgery;  Laterality: N/A;  Lumbar Four-Five partial Lumbar Three Laminectomy  . Colonscopy    . Total knee arthroplasty Left 03/21/2014    Procedure: TOTAL KNEE ARTHROPLASTY;  Surgeon: Mauri Pole, MD;  Location: [facility];  Service: Orthopedics;  Laterality: Left;  left femoral nerve block  . Extracorporeal shock wave lithotripsy  03-09-2014  . Cataract extraction w/ intraocular lens  implant, bilateral  2001  . Percutaneous nephrostolithotomy Left 09-19-2009  . Cardiac catheterization  01-23-2010  dr Irish Lack    no hemodynamically significant CAD/  normal LVF  . Transthoracic echocardiogram  12-28-2009    normal LVF, ef 60-65%/  mild AV sclerosis without stenosis/  mild AR, MR and TR/  trace PR  . Cardiovascular stress test  08-16-2008  dr Irish Lack    normal nuclear perfusion study/  no ischemia/  normal LV function and wall motion, ef 94%  . Breast lumpectomy  1990's     bengin  . Abdominal hysterectomy  age 43   Family History  Problem Relation Age of Onset  . Anesthesia problems Mother   . Heart attack Brother  60  . Hypertension Sister     X2  . Hypertension Brother     X2  . Hypertension Mother   . Stroke Maternal Grandmother 54  . Stroke Maternal Grandfather 68   Social History  Substance Use Topics  . Smoking status: Never Smoker   . Smokeless tobacco: Never Used  . Alcohol Use: No   OB History    No data available     Review of Systems 10/14 systems reviewed and are negative other than those stated in the HPI    Allergies  Colace; Etodolac; Penicillins; Relafen; Valacyclovir hcl; Diclofenac sodium; Ciprofloxacin;  and Sulfa antibiotics  Home Medications   Prior to Admission medications   Medication Sig Start Date End Date Taking? Authorizing Provider  acetaminophen (TYLENOL ARTHRITIS PAIN) 650 MG CR tablet Take 650 mg by mouth 2 (two) times daily.   Yes Historical Provider, MD  amLODipine (NORVASC) 5 MG tablet Take 5 mg by mouth every morning.    Yes Historical Provider, MD  aspirin 81 MG chewable tablet Chew 81 mg by mouth daily.    Yes Historical Provider, MD  atorvastatin (LIPITOR) 20 MG tablet Take 20 mg by mouth every evening.    Yes Historical Provider, MD  b complex vitamins tablet Take 1 tablet by mouth every morning.   Yes Historical Provider, MD  clarithromycin (BIAXIN) 500 MG tablet TK 1 T PO 1 HOUR PRIOR TO DENTAL WORK 11/30/14  Yes Historical Provider, MD  doxycycline (VIBRA-TABS) 100 MG tablet Take 1 tablet (100 mg total) by mouth 2 (two) times daily. 06/21/15  Yes Franchot Gallo, MD  FLUoxetine (PROZAC) 10 MG tablet Take 10 mg by mouth every morning.    Yes Historical Provider, MD  levothyroxine (SYNTHROID, LEVOTHROID) 75 MCG tablet Take 75 mcg by mouth daily before breakfast.    Yes Historical Provider, MD  Liniments (BLUE-EMU SUPER STRENGTH) CREA Apply to back as needed for pain.   Yes Historical Provider, MD  loratadine (CLARITIN) 10 MG tablet Take 10 mg by mouth daily as needed for allergies.    Yes Historical Provider, MD  losartan (COZAAR) 50 MG tablet Take 50 mg by mouth every evening.    Yes Historical Provider, MD  Multiple Vitamin (MULTIVITAMIN WITH MINERALS) TABS tablet Take 1 tablet by mouth every morning.   Yes Historical Provider, MD  Oxycodone HCl 10 MG TABS Take 10 mg by mouth every 5 (five) hours as needed (pain).    Yes Historical Provider, MD  Probiotic Product (PROBIOTIC DAILY PO) Take 1 tablet by mouth every morning.   Yes Historical Provider, MD  ranitidine (ZANTAC) 150 MG tablet Take 150 mg by mouth daily. MORNING   Yes Historical Provider, MD  vitamin B-12  (CYANOCOBALAMIN) 1000 MCG tablet Take 1,000 mcg by mouth every morning.   Yes Historical Provider, MD   BP 122/72 mmHg  Pulse 86  Temp(Src) 98.1 F (36.7 C) (Oral)  Resp 20  SpO2 100% Physical Exam Physical Exam  Nursing note and vitals reviewed. Constitutional: Well developed, well nourished, non-toxic, and in no acute distress Head: Normocephalic and atraumatic.  Mouth/Throat: Oropharynx is clear and moist.  Neck: Normal range of motion. Neck supple.  Cardiovascular: Normal rate and regular rhythm.   Pulmonary/Chest: Effort normal and breath sounds normal.  Abdominal: Soft. There is no tenderness. There is no rebound and no guarding. bilateral CVA tenderness. Musculoskeletal: Normal range of motion.  Neurological: Alert, no facial droop, fluent speech, moves all extremities symmetrically Skin: Skin is  warm and dry.  Psychiatric: Cooperative  ED Course  Procedures (including critical care time) Labs Review Labs Reviewed - No data to display  Imaging Review Ct Renal Stone Study  06/23/2015  CLINICAL DATA:  Kidney stone surgery on Thursday, had stents placed. Pt states she woke up this morning with extreme flank pain, she took 2 10 mg oxycodones, then she removed bilateral stents trying to improve the pain - only to make it worse. Has not urinated since EXAM: CT ABDOMEN AND PELVIS WITHOUT CONTRAST TECHNIQUE: Multidetector CT imaging of the abdomen and pelvis was performed following the standard protocol without IV contrast. COMPARISON:  05/29/2015 FINDINGS: Kidneys, ureters, bladder: Moderate bilateral hydronephrosis. There is perinephric and periureteral stranding. Ureters are dilated all the way to the bladder. No ureteral stone is seen. A tiny density projects in the posterior bladder consistent with a small stone. There is a stone in the dependent aspect the dilated left renal pelvis. Nonobstructing stones are noted in the mid and lower pole the left kidney. The bladder is partly  decompressed with a Foley catheter. No bladder mass or wall thickening. Lung bases: Minor subsegmental atelectasis mostly in the left lower lobe adjacent to the hiatal hernia. Heart normal in size. Liver: Multiple low-density masses consistent with cysts. These are stable. Spleen, gallbladder, pancreas, adrenal glands:  Unremarkable. Uterus and adnexa:  Uterus surgically absent.  No pelvic masses. Lymph nodes:  No pathologically enlarged lymph nodes. Ascites:  None. Gastrointestinal: There is a moderate hiatal hernia with a paraesophageal component, stable from the prior study. Small bowel and colon are unremarkable. Musculoskeletal: Degenerative changes throughout the visualized spine. Compression fracture of L4 treated with previous vertebroplasty. No osteoblastic or osteolytic lesions. IMPRESSION: 1. Moderate bilateral hydronephrosis with periureteral perinephric stranding. Nonobstructing stones in the left renal collecting system. No ureteral stones. 2. No other acute findings. Electronically Signed   By: Lajean Manes M.D.   On: 06/23/2015 12:38   I have personally reviewed and evaluated these images and lab results as part of my medical decision-making.   EKG Interpretation None      MDM   Final diagnoses:  Flank pain   82 year old female with history of recent UTI complicated by bilateral obstructing stones status post ureteral stenting who presents with recurrent bilateral flank pain in the setting of pulling out of her stents this morning. She is well-appearing and in mild distress secondary to pain when I first evaluate this patient. She has stable vital signs and is afebrile. With a soft and benign abdomen and bilateral CVA tenderness which she says had been persistent since stent replacement 3 days ago. Blood work and urinalysis reviewed from earlier today, and overall unremarkable. She is urinating without difficulty and is not retaining. CT from earlier today also reviewed, showing mild  residual hydronephrosis due to recent stents. Plan was for IV pain control, but she states that since she has been in the emergency Department her home oxycodone is now starting to kick in and her pain is currently 1-2 out of 10 in severity. At this time she feels that her pain is well controlled for discharge home. She will schedule her home pain medications for the first 24-48 hours. She will continue to finish her antibiotics. She will have close follow-up with her PCP and urologist. Strict return and follow-up instructions are all reviewed. She expressed understanding of all discharge instructions and felt comfortable with the plan of care.  Forde Dandy, MD 06/23/15 [phone]

## 2015-06-23 NOTE — Discharge Instructions (Signed)
Return without fail for worsening symptoms, including uncontrolled pain, vomiting and unable to keep down food/fluids, fever, confusion, or any other symptoms concerning to you. At least for next 24 hours schedule your home oxycodone every 5 hours, and take tylenol 650 mg every 6 hours. Then take as needed. Follow-up very closely with your primary care provider and urologist.  Flank Pain Flank pain refers to pain that is located on the side of the body between the upper abdomen and the back. The pain may occur over a short period of time (acute) or may be long-term or reoccurring (chronic). It may be mild or severe. Flank pain can be caused by many things. CAUSES  Some of the more common causes of flank pain include:  Muscle strains.   Muscle spasms.   A disease of your spine (vertebral disk disease).   A lung infection (pneumonia).   Fluid around your lungs (pulmonary edema).   A kidney infection.   Kidney stones.   A very painful skin rash caused by the chickenpox virus (shingles).   Gallbladder disease.  [place] care will depend on the cause of your pain. In general,  Rest as directed by your caregiver.  Drink enough fluids to keep your urine clear or pale yellow.  Only take over-the-counter or prescription medicines as directed by your caregiver. Some medicines may help relieve the pain.  Tell your caregiver about any changes in your pain.  Follow up with your caregiver as directed. SEEK IMMEDIATE MEDICAL CARE IF:   Your pain is not controlled with medicine.   You have new or worsening symptoms.  Your pain increases.   You have abdominal pain.   You have shortness of breath.   You have persistent nausea or vomiting.   You have swelling in your abdomen.   You feel faint or pass out.   You have blood in your urine.  You have a fever or persistent symptoms for more than 2-3 days.  You have a fever and your symptoms  suddenly get worse. MAKE SURE YOU:   Understand these instructions.  Will watch your condition.  Will get help right away if you are not doing well or get worse.   This information is not intended to replace advice given to you by your health care provider. Make sure you discuss any questions you have with your health care provider.   Document Released: 06/12/2005 Document Revised: 01/14/2012 Document Reviewed: 12/04/2011 Elsevier Interactive Patient Education [employer].

## 2015-06-23 NOTE — ED Notes (Signed)
Pt reports right flank pain, was seen earlier today and dx with left flank stone. Pt reports severe pain despiote taking oxycodone every 4 hours. Also reports dysuria and hematuria. sts nausea yet no emesis today.

## 2015-06-23 NOTE — Consult Note (Signed)
Urology Consult  Referring physician:   Dr. Fransico Michael Reason for referral:  Stent removal  Chief Complaint: flank pain  History of Present Illness: Becky Duran is a 82 y.o. year old female who is s/p ureteroscopic management of bilateral ureteral/renal calculi.    She presented to the emergency room at [place] on January 24,  with bilateral flank pain,  and was found to have a urinary tract infection.    She was also found to have bilateral obstructing UPJ/ureteral stones. Dr. Barnie Del place stents bilaterally urgently, and she was treated with IV antibiotics.  After treatment with Levaquin, and suppressive nitrofurantoin, she presents for management of her stones with ureteroscopy, holmium laser and extraction.   However, the stents bother her, and she removed them and now presents to [facility] for bilateral flank pain.    Past Medical History  Diagnosis Date  . GERD (gastroesophageal reflux disease)   . Hyperlipidemia   . Osteoarthritis   . Osteopenia   . Scoliosis   . Hypothyroidism   . Psoriasis   . Depression   . Hiatal hernia   . Renal calculus, bilateral   . History of kidney stones   . CKD (chronic kidney disease), stage II   . History of endometriosis   . Heart murmur   . Wears contact lenses     right eye only  . Chronic low back pain   . Narcotic drug use   . Nocturia   . Hypertension cardiologist-  dr Irish Lack    pt states intermittant bp spike at night cause headache   Past Surgical History  Procedure Laterality Date  . Lipoma excision  02-05-2011    Back  . Lumbar laminectomy/decompression microdiscectomy  06/04/2011    Procedure: LUMBAR LAMINECTOMY/DECOMPRESSION MICRODISCECTOMY;  Surgeon: Floyce Stakes, MD;  Location: [place] [facility];  Service: Neurosurgery;  Laterality: N/A;  Lumbar Four-Five partial Lumbar Three Laminectomy  . Colonscopy    . Total knee arthroplasty Left 03/21/2014    Procedure: TOTAL KNEE ARTHROPLASTY;  Surgeon: Mauri Pole, MD;   Location: [facility];  Service: Orthopedics;  Laterality: Left;  left femoral nerve block  . Extracorporeal shock wave lithotripsy  03-09-2014  . Cataract extraction w/ intraocular lens  implant, bilateral  2001  . Percutaneous nephrostolithotomy Left 09-19-2009  . Cardiac catheterization  01-23-2010  dr Irish Lack    no hemodynamically significant CAD/  normal LVF  . Transthoracic echocardiogram  12-28-2009    normal LVF, ef 60-65%/  mild AV sclerosis without stenosis/  mild AR, MR and TR/  trace PR  . Cardiovascular stress test  08-16-2008  dr Irish Lack    normal nuclear perfusion study/  no ischemia/  normal LV function and wall motion, ef 94%  . Breast lumpectomy  1990's     b[place]  . Abdominal hysterectomy  age 32    Medications: I have reviewed the patient's current medications. Allergies:  Allergies  Allergen Reactions  . Colace [Docusate Sodium] Rash  . Etodolac Other (See Comments)    Spiked blood pressure   . Penicillins Hives and Rash    Has patient had a PCN reaction causing immediate rash, facial/tongue/throat swelling, SOB or lightheadedness with hypotension: No Has patient had a PCN reaction causing severe rash involving mucus membranes or skin necrosis: Hives/rash Has patient had a PCN reaction that required hospitalization No Has patient had a PCN reaction occurring within the last 10 years: No If all of the above answers are "NO", then may  proceed with Cephalosporin use.   . Relafen [Nabumetone] Other (See Comments)    Unknown Not sure if this med was the cause  . Valacyclovir Hcl Other (See Comments)    Possible confusion   . Diclofenac Sodium Other (See Comments)    Afraid bp would spike  . Ciprofloxacin Other (See Comments)    Metallic taste   . Sulfa Antibiotics Diarrhea    Family History  Problem Relation Age of Onset  . Anesthesia problems Mother   . Heart attack Brother     48  . Hypertension Sister     X8  . Hypertension Brother     X2  .  Hypertension Mother   . Stroke Maternal Grandmother 63  . Stroke Maternal Grandfather 68   Social History:  reports that she has never smoked. She has never used smokeless tobacco. She reports that she does not drink alcohol or use illicit drugs.  ROS: All systems are reviewed and negative except as noted. Bilateral flank pain.   Physical Exam:  Vital signs in last 24 hours: Temp:  [97.4 F (36.3 C)] 97.4 F (36.3 C) (02/18 1018) Pulse Rate:  [118] 118 (02/18 1018) Resp:  [26] 26 (02/18 1018) BP: (179)/(96) 179/96 mmHg (02/18 1018) SpO2:  [88 %-98 %] 98 % (02/18 1125)  Cardiovascular: Skin warm; not flushed Respiratory: Breaths quiet; no shortness of breath Abdomen: No masses Neurological: Normal sensation to touch Musculoskeletal: Normal motor function arms and legs Lymphatics: No inguinal adenopathy Skin: No rashes Genitourinary:normal s-p area. No flank pain.   Laboratory Data:  Results for orders placed or performed during the hospital encounter of 06/23/15 (from the past 72 hour(s))  Urinalysis, Routine w reflex microscopic (not at [facility])     Status: Abnormal   Collection Time: 06/23/15 10:40 AM  Result Value Ref Range   Color, Urine RED (A) YELLOW    Comment: BIOCHEMICALS MAY BE AFFECTED BY COLOR   APPearance TURBID (A) CLEAR   Specific Gravity, Urine 1.016 1.005 - 1.030   pH 6.5 5.0 - 8.0   Glucose, UA 100 (A) NEGATIVE mg/dL   Hgb urine dipstick LARGE (A) NEGATIVE   Bilirubin Urine SMALL (A) NEGATIVE   Ketones, ur 15 (A) NEGATIVE mg/dL   Protein, ur >300 (A) NEGATIVE mg/dL   Nitrite NEGATIVE NEGATIVE   Leukocytes, UA MODERATE (A) NEGATIVE  Urine microscopic-add on     Status: Abnormal   Collection Time: 06/23/15 10:40 AM  Result Value Ref Range   Squamous Epithelial / LPF NONE SEEN NONE SEEN   WBC, UA 6-30 0 - 5 WBC/hpf   RBC / HPF TOO NUMEROUS TO COUNT 0 - 5 RBC/hpf   Bacteria, UA FEW (A) NONE SEEN  Basic metabolic panel     Status: Abnormal   Collection  Time: 06/23/15 11:05 AM  Result Value Ref Range   Sodium 140 135 - 145 mmol/L    Comment: RESULT REPEATED AND VERIFIED   Potassium 3.8 3.5 - 5.1 mmol/L    Comment: RESULT REPEATED AND VERIFIED   Chloride 103 101 - 111 mmol/L    Comment: RESULT REPEATED AND VERIFIED   CO2 19 (L) 22 - 32 mmol/L    Comment: RESULT REPEATED AND VERIFIED   Glucose, Bld 137 (H) 65 - 99 mg/dL   BUN 19 6 - 20 mg/dL   Creatinine, Ser 1.18 (H) 0.44 - 1.00 mg/dL   Calcium 9.8 8.9 - 10.3 mg/dL    Comment: RESULT REPEATED AND VERIFIED  GFR calc non Af Amer 42 (L) >60 mL/min   GFR calc Af Amer 49 (L) >60 mL/min    Comment: (NOTE) The eGFR has been calculated using the CKD EPI equation. This calculation has not been validated in all clinical situations. eGFR's persistently <60 mL/min signify possible Chronic Kidney Disease.    Anion gap 18 (H) 5 - 15    Comment: REPEATED TO VERIFY  CBC with Differential     Status: Abnormal   Collection Time: 06/23/15 11:05 AM  Result Value Ref Range   WBC 11.8 (H) 4.0 - 10.5 K/uL   RBC 4.13 3.87 - 5.11 MIL/uL   Hemoglobin 12.6 12.0 - 15.0 g/dL   HCT 39.0 36.0 - 46.0 %   MCV 94.4 78.0 - 100.0 fL   MCH 30.5 26.0 - 34.0 pg   MCHC 32.3 30.0 - 36.0 g/dL   RDW 14.0 11.5 - 15.5 %   Platelets 290 150 - 400 K/uL   Neutrophils Relative % 71 %   Neutro Abs 8.5 (H) 1.7 - 7.7 K/uL   Lymphocytes Relative 18 %   Lymphs Abs 2.1 0.7 - 4.0 K/uL   Monocytes Relative 8 %   Monocytes Absolute 0.9 0.1 - 1.0 K/uL   Eosinophils Relative 3 %   Eosinophils Absolute 0.3 0.0 - 0.7 K/uL   Basophils Relative 0 %   Basophils Absolute 0.0 0.0 - 0.1 K/uL   No results found for this or any previous visit (from the past 240 hour(s)). Creatinine:  Recent Labs  06/21/15 0840 06/23/15 1105  CREATININE 0.90 1.18*    Xrays: Pending/   Impression/Assessment:    Much improved since 1 gram IV Tylenol IV. Note increase Cr.   Plan:  [place] for d/c but will need to f/u with Dr. Dorina Hoyer.    Daion Ginsberg I Malasia Torain 06/23/2015, 12:44 PM

## 2015-06-23 NOTE — ED Notes (Signed)
Kidney stone surgery on Thursday, had stents placed. Pt states she woke up this morning with extreme flank pain, she took 2 10 mg oxycodones, then she removed bilateral stents trying to improve the pain - only to make it worse. Has not urinated since. Presents hyperventilating and vomiting c/o pain.

## 2015-06-23 NOTE — Discharge Instructions (Signed)
Kidney Stones °Kidney stones (urolithiasis) are deposits that form inside your kidneys. The intense pain is caused by the stone moving through the urinary tract. When the stone moves, the ureter goes into spasm around the stone. The stone is usually passed in the urine.  °CAUSES  °· A disorder that makes certain neck glands produce too much parathyroid hormone (primary hyperparathyroidism). °· A buildup of uric acid crystals, similar to gout in your joints. °· Narrowing (stricture) of the ureter. °· A kidney obstruction present at birth (congenital obstruction). °· Previous surgery on the kidney or ureters. °· Numerous kidney infections. °SYMPTOMS  °· Feeling sick to your stomach (nauseous). °· Throwing up (vomiting). °· Blood in the urine (hematuria). °· Pain that usually spreads (radiates) to the groin. °· Frequency or urgency of urination. °DIAGNOSIS  °· Taking a history and physical exam. °· Blood or urine tests. °· CT scan. °· Occasionally, an examination of the inside of the urinary bladder (cystoscopy) is performed. °TREATMENT  °· Observation. °· Increasing your fluid intake. °· Extracorporeal shock wave lithotripsy--This is a noninvasive procedure that uses shock waves to break up kidney stones. °· Surgery may be needed if you have severe pain or persistent obstruction. There are various surgical procedures. Most of the procedures are performed with the use of small instruments. Only small incisions are needed to accommodate these instruments, so recovery time is minimized. °The size, location, and chemical composition are all important variables that will determine the proper choice of action for you. Talk to your health care provider to better understand your situation so that you will minimize the risk of injury to yourself and your kidney.  °HOME CARE INSTRUCTIONS  °· Drink enough water and fluids to keep your urine clear or pale yellow. This will help you to pass the stone or stone fragments. °· Strain  all urine through the provided strainer. Keep all particulate matter and stones for your health care provider to see. The stone causing the pain may be as small as a grain of salt. It is very important to use the strainer each and every time you pass your urine. The collection of your stone will allow your health care provider to analyze it and verify that a stone has actually passed. The stone analysis will often identify what you can do to reduce the incidence of recurrences. °· Only take over-the-counter or prescription medicines for pain, discomfort, or fever as directed by your health care provider. °· Keep all follow-up visits as told by your health care provider. This is important. °· Get follow-up X-rays if required. The absence of pain does not always mean that the stone has passed. It may have only stopped moving. If the urine remains completely obstructed, it can cause loss of kidney function or even complete destruction of the kidney. It is your responsibility to make sure X-rays and follow-ups are completed. Ultrasounds of the kidney can show blockages and the status of the kidney. Ultrasounds are not associated with any radiation and can be performed easily in a matter of minutes. °· Make changes to your daily diet as told by your health care provider. You may be told to: °¨ Limit the amount of salt that you eat. °¨ Eat 5 or more servings of fruits and vegetables each day. °¨ Limit the amount of meat, poultry, fish, and eggs that you eat. °· Collect a 24-hour urine sample as told by your health care provider. You may need to collect another urine sample every 6-12   months. °SEEK MEDICAL CARE IF: °· You experience pain that is progressive and unresponsive to any pain medicine you have been prescribed. °SEEK IMMEDIATE MEDICAL CARE IF:  °· Pain cannot be controlled with the prescribed medicine. °· You have a fever or shaking chills. °· The severity or intensity of pain increases over 18 hours and is not  relieved by pain medicine. °· You develop a new onset of abdominal pain. °· You feel faint or pass out. °· You are unable to urinate. °  °This information is not intended to replace advice given to you by your health care provider. Make sure you discuss any questions you have with your health care provider. °  °Document Released: 04/21/2005 Document Revised: 01/10/2015 Document Reviewed: 09/22/2012 °Elsevier Interactive Patient Education ©2016 [employer] ° °

## 2015-06-23 NOTE — ED Notes (Signed)
Pt is stating she is no longer in pain, reports 2/10 adding that is her baseline for back pain, requesting to see he EDP so that she can request her to leave. I have requested the EDP to come talk to her and there seems to be a consensus to let her go and follow up with the Urologist.

## 2015-06-23 NOTE — ED Notes (Signed)
Patient transported to CT 

## 2015-06-23 NOTE — ED Provider Notes (Signed)
CSN: [id number]     Arrival date & time 06/23/15  1007 History   First MD Initiated Contact with Patient 06/23/15 1039     Chief Complaint  Patient presents with  . Post-op Problem  . Urinary Retention     (Consider location/radiation/quality/duration/timing/severity/associated sxs/prior Treatment) HPI Patient prior history of UTI and retained kidney stones. Stents had been placed in UTI was treated. 2 days ago Dr. Beatrix Fetters removed and replaced the stents. The patient was to keep him in until Monday. She reports that the pain became much worse and she was very uncomfortable in a sitting position. She self extricated the stents this morning. Since then the pain has been even worse. Pain is worst in her left flank. She was having dry heaves due to pain. Since she took the stents out she reports she's not been able to urinate. Past Medical History  Diagnosis Date  . GERD (gastroesophageal reflux disease)   . Hyperlipidemia   . Osteoarthritis   . Osteopenia   . Scoliosis   . Hypothyroidism   . Psoriasis   . Depression   . Hiatal hernia   . Renal calculus, bilateral   . History of kidney stones   . CKD (chronic kidney disease), stage II   . History of endometriosis   . Heart murmur   . Wears contact lenses     right eye only  . Chronic low back pain   . Narcotic drug use   . Nocturia   . Hypertension cardiologist-  dr Irish Lack    pt states intermittant bp spike at night cause headache   Past Surgical History  Procedure Laterality Date  . Lipoma excision  02-05-2011    Back  . Lumbar laminectomy/decompression microdiscectomy  06/04/2011    Procedure: LUMBAR LAMINECTOMY/DECOMPRESSION MICRODISCECTOMY;  Surgeon: Floyce Stakes, MD;  Location: [facility];  Service: Neurosurgery;  Laterality: N/A;  Lumbar Four-Five partial Lumbar Three Laminectomy  . Colonscopy    . Total knee arthroplasty Left 03/21/2014    Procedure: TOTAL KNEE ARTHROPLASTY;  Surgeon: Mauri Pole, MD;   Location: [facility];  Service: Orthopedics;  Laterality: Left;  left femoral nerve block  . Extracorporeal shock wave lithotripsy  03-09-2014  . Cataract extraction w/ intraocular lens  implant, bilateral  2001  . Percutaneous nephrostolithotomy Left 09-19-2009  . Cardiac catheterization  01-23-2010  dr Irish Lack    no hemodynamically significant CAD/  normal LVF  . Transthoracic echocardiogram  12-28-2009    normal LVF, ef 60-65%/  mild AV sclerosis without stenosis/  mild AR, MR and TR/  trace PR  . Cardiovascular stress test  08-16-2008  dr Irish Lack    normal nuclear perfusion study/  no ischemia/  normal LV function and wall motion, ef 94%  . Breast lumpectomy  1990's     bengin  . Abdominal hysterectomy  age 54   Family History  Problem Relation Age of Onset  . Anesthesia problems Mother   . Heart attack Brother     66  . Hypertension Sister     X22  . Hypertension Brother     X2  . Hypertension Mother   . Stroke Maternal Grandmother 48  . Stroke Maternal Grandfather 68   Social History  Substance Use Topics  . Smoking status: Never Smoker   . Smokeless tobacco: Never Used  . Alcohol Use: No   OB History    No data available     Review of Systems  10 Systems  reviewed and are negative for acute change except as noted in the HPI.   Allergies  Colace; Etodolac; Penicillins; Relafen; Valacyclovir hcl; Diclofenac sodium; Ciprofloxacin; and Sulfa antibiotics  Home Medications   Prior to Admission medications   Medication Sig Start Date End Date Taking? Authorizing Provider  acetaminophen (TYLENOL ARTHRITIS PAIN) 650 MG CR tablet Take 650 mg by mouth 2 (two) times daily.   Yes Historical Provider, MD  amLODipine (NORVASC) 5 MG tablet Take 5 mg by mouth every morning.    Yes Historical Provider, MD  aspirin 81 MG chewable tablet Chew 81 mg by mouth daily.    Yes Historical Provider, MD  atorvastatin (LIPITOR) 20 MG tablet Take 20 mg by mouth every evening.    Yes  Historical Provider, MD  b complex vitamins tablet Take 1 tablet by mouth every morning.   Yes Historical Provider, MD  clarithromycin (BIAXIN) 500 MG tablet TK 1 T PO 1 HOUR PRIOR TO DENTAL WORK 11/30/14  Yes Historical Provider, MD  doxycycline (VIBRA-TABS) 100 MG tablet Take 1 tablet (100 mg total) by mouth 2 (two) times daily. 06/21/15  Yes Franchot Gallo, MD  FLUoxetine (PROZAC) 10 MG tablet Take 10 mg by mouth every morning.    Yes Historical Provider, MD  levothyroxine (SYNTHROID, LEVOTHROID) 75 MCG tablet Take 75 mcg by mouth daily before breakfast.    Yes Historical Provider, MD  Liniments (BLUE-EMU SUPER STRENGTH) CREA Apply to back as needed for pain.   Yes Historical Provider, MD  loratadine (CLARITIN) 10 MG tablet Take 10 mg by mouth daily as needed for allergies.    Yes Historical Provider, MD  losartan (COZAAR) 50 MG tablet Take 50 mg by mouth every evening.    Yes Historical Provider, MD  Multiple Vitamin (MULTIVITAMIN WITH MINERALS) TABS tablet Take 1 tablet by mouth every morning.   Yes Historical Provider, MD  Oxycodone HCl 10 MG TABS Take 10 mg by mouth every 5 (five) hours as needed (pain).    Yes Historical Provider, MD  Probiotic Product (PROBIOTIC DAILY PO) Take 1 tablet by mouth every morning.   Yes Historical Provider, MD  ranitidine (ZANTAC) 150 MG tablet Take 150 mg by mouth daily. MORNING   Yes Historical Provider, MD  vitamin B-12 (CYANOCOBALAMIN) 1000 MCG tablet Take 1,000 mcg by mouth every morning.   Yes Historical Provider, MD   BP 129/75 mmHg  Pulse 102  Temp(Src) 97.4 F (36.3 C) (Oral)  Resp 14  SpO2 94% Physical Exam  Constitutional: She is oriented to person, place, and time. She appears well-developed and well-nourished.  Patient appears uncomfortable but she has had one dose of fentanyl reports some improvement. She is nontoxic and alert. No respiratory distress.  HENT:  Head: Normocephalic and atraumatic.  Eyes: EOM are normal. Pupils are equal,  round, and reactive to light.  Neck: Neck supple.  Cardiovascular: Normal rate, regular rhythm, normal heart sounds and intact distal pulses.   Pulmonary/Chest: Effort normal and breath sounds normal.  Abdominal: Soft. Bowel sounds are normal. She exhibits no distension. There is no tenderness.  Musculoskeletal: Normal range of motion. She exhibits no edema or tenderness.  Neurological: She is alert and oriented to person, place, and time. She has normal strength. Coordination normal. GCS eye subscore is 4. GCS verbal subscore is 5. GCS motor subscore is 6.  Skin: Skin is warm, dry and intact.  Psychiatric: She has a normal mood and affect.    ED Course  Procedures (including critical care  time) Labs Review Labs Reviewed  URINALYSIS, ROUTINE W REFLEX MICROSCOPIC (NOT AT [facility]) - Abnormal; Notable for the following:    Color, Urine RED (*)    APPearance TURBID (*)    Glucose, UA 100 (*)    Hgb urine dipstick LARGE (*)    Bilirubin Urine SMALL (*)    Ketones, ur 15 (*)    Protein, ur >300 (*)    Leukocytes, UA MODERATE (*)    All other components within normal limits  BASIC METABOLIC PANEL - Abnormal; Notable for the following:    CO2 19 (*)    Glucose, Bld 137 (*)    Creatinine, Ser 1.18 (*)    GFR calc non Af Amer 42 (*)    GFR calc Af Amer 49 (*)    Anion gap 18 (*)    All other components within normal limits  CBC WITH DIFFERENTIAL/PLATELET - Abnormal; Notable for the following:    WBC 11.8 (*)    Neutro Abs 8.5 (*)    All other components within normal limits  URINE MICROSCOPIC-ADD ON - Abnormal; Notable for the following:    Bacteria, UA FEW (*)    All other components within normal limits  URINE CULTURE    Imaging Review Ct Renal Stone Study  06/23/2015  CLINICAL DATA:  Kidney stone surgery on Thursday, had stents placed. Pt states she woke up this morning with extreme flank pain, she took 2 10 mg oxycodones, then she removed bilateral stents trying to improve the  pain - only to make it worse. Has not urinated since EXAM: CT ABDOMEN AND PELVIS WITHOUT CONTRAST TECHNIQUE: Multidetector CT imaging of the abdomen and pelvis was performed following the standard protocol without IV contrast. COMPARISON:  05/29/2015 FINDINGS: Kidneys, ureters, bladder: Moderate bilateral hydronephrosis. There is perinephric and periureteral stranding. Ureters are dilated all the way to the bladder. No ureteral stone is seen. A tiny density projects in the posterior bladder consistent with a small stone. There is a stone in the dependent aspect the dilated left renal pelvis. Nonobstructing stones are noted in the mid and lower pole the left kidney. The bladder is partly decompressed with a Foley catheter. No bladder mass or wall thickening. Lung bases: Minor subsegmental atelectasis mostly in the left lower lobe adjacent to the hiatal hernia. Heart normal in size. Liver: Multiple low-density masses consistent with cysts. These are stable. Spleen, gallbladder, pancreas, adrenal glands:  Unremarkable. Uterus and adnexa:  Uterus surgically absent.  No pelvic masses. Lymph nodes:  No pathologically enlarged lymph nodes. Ascites:  None. Gastrointestinal: There is a moderate hiatal hernia with a paraesophageal component, stable from the prior study. Small bowel and colon are unremarkable. Musculoskeletal: Degenerative changes throughout the visualized spine. Compression fracture of L4 treated with previous vertebroplasty. No osteoblastic or osteolytic lesions. IMPRESSION: 1. Moderate bilateral hydronephrosis with periureteral perinephric stranding. Nonobstructing stones in the left renal collecting system. No ureteral stones. 2. No other acute findings. Electronically Signed   By: Lajean Manes M.D.   On: 06/23/2015 12:38   I have personally reviewed and evaluated these images and lab results as part of my medical decision-making.   EKG Interpretation None     Consult urology (11:35) advised to  give IV Tylenol and obtain a noncontrast CT stone study. Patient will be assessed in the emergency department by urologist. MDM   Final diagnoses:  Kidney stone  Ureteral stent displacement, initial encounter [facility])    Dr. Gaynelle Arabian has seen the patient in  the emergency department. At this time he feels she is appropriate for discharge. Her pain is now well controlled. Stents will not be replaced. Patient has antibiotics that she is still finishing which will last for another 2 days. Urine culture has been obtained. Patient is instructed to complete her course of antibiotics. She has pain medications to use at home. Return precautions are provided. At this point, the patient is alert and nontoxic. She is comfortable in appearance. Plans are placed for follow-up urology. Patient is counseled to follow-up with her family physician as well this week.   Charlesetta Shanks, MD 06/23/15 1346

## 2015-06-24 LAB — URINE CULTURE: Culture: NO GROWTH

## 2015-06-25 ENCOUNTER — Encounter (HOSPITAL_BASED_OUTPATIENT_CLINIC_OR_DEPARTMENT_OTHER): Payer: Self-pay | Admitting: Urology

## 2015-06-26 DIAGNOSIS — N200 Calculus of kidney: Secondary | ICD-10-CM | POA: Diagnosis not present

## 2015-06-26 DIAGNOSIS — Z0000 Encounter for general adult medical examination without abnormal findings: Secondary | ICD-10-CM | POA: Diagnosis not present

## 2015-06-26 DIAGNOSIS — M549 Dorsalgia, unspecified: Secondary | ICD-10-CM | POA: Diagnosis not present

## 2015-07-02 ENCOUNTER — Encounter (HOSPITAL_BASED_OUTPATIENT_CLINIC_OR_DEPARTMENT_OTHER): Payer: Self-pay | Admitting: Urology

## 2015-07-04 DIAGNOSIS — Z79891 Long term (current) use of opiate analgesic: Secondary | ICD-10-CM | POA: Diagnosis not present

## 2015-07-04 DIAGNOSIS — M961 Postlaminectomy syndrome, not elsewhere classified: Secondary | ICD-10-CM | POA: Diagnosis not present

## 2015-07-04 DIAGNOSIS — M5136 Other intervertebral disc degeneration, lumbar region: Secondary | ICD-10-CM | POA: Diagnosis not present

## 2015-07-12 DIAGNOSIS — N200 Calculus of kidney: Secondary | ICD-10-CM | POA: Diagnosis not present

## 2015-07-12 DIAGNOSIS — Z0000 Encounter for general adult medical examination without abnormal findings: Secondary | ICD-10-CM | POA: Diagnosis not present

## 2015-07-26 DIAGNOSIS — M7552 Bursitis of left shoulder: Secondary | ICD-10-CM | POA: Diagnosis not present

## 2015-10-04 DIAGNOSIS — M5136 Other intervertebral disc degeneration, lumbar region: Secondary | ICD-10-CM | POA: Diagnosis not present

## 2015-10-04 DIAGNOSIS — M7062 Trochanteric bursitis, left hip: Secondary | ICD-10-CM | POA: Diagnosis not present

## 2015-10-04 DIAGNOSIS — G894 Chronic pain syndrome: Secondary | ICD-10-CM | POA: Diagnosis not present

## 2015-10-04 DIAGNOSIS — M47816 Spondylosis without myelopathy or radiculopathy, lumbar region: Secondary | ICD-10-CM | POA: Diagnosis not present

## 2015-10-15 DIAGNOSIS — C44519 Basal cell carcinoma of skin of other part of trunk: Secondary | ICD-10-CM | POA: Diagnosis not present

## 2015-10-15 DIAGNOSIS — L400 Psoriasis vulgaris: Secondary | ICD-10-CM | POA: Diagnosis not present

## 2015-10-25 DIAGNOSIS — R300 Dysuria: Secondary | ICD-10-CM | POA: Diagnosis not present

## 2015-10-25 DIAGNOSIS — N3090 Cystitis, unspecified without hematuria: Secondary | ICD-10-CM | POA: Diagnosis not present

## 2015-11-08 DIAGNOSIS — H43813 Vitreous degeneration, bilateral: Secondary | ICD-10-CM | POA: Diagnosis not present

## 2015-11-08 DIAGNOSIS — H5203 Hypermetropia, bilateral: Secondary | ICD-10-CM | POA: Diagnosis not present

## 2015-11-08 DIAGNOSIS — H353131 Nonexudative age-related macular degeneration, bilateral, early dry stage: Secondary | ICD-10-CM | POA: Diagnosis not present

## 2015-11-08 DIAGNOSIS — Z961 Presence of intraocular lens: Secondary | ICD-10-CM | POA: Diagnosis not present

## 2015-11-08 DIAGNOSIS — H04123 Dry eye syndrome of bilateral lacrimal glands: Secondary | ICD-10-CM | POA: Diagnosis not present

## 2015-11-08 DIAGNOSIS — H52223 Regular astigmatism, bilateral: Secondary | ICD-10-CM | POA: Diagnosis not present

## 2015-11-08 DIAGNOSIS — H524 Presbyopia: Secondary | ICD-10-CM | POA: Diagnosis not present

## 2015-11-21 DIAGNOSIS — E039 Hypothyroidism, unspecified: Secondary | ICD-10-CM | POA: Diagnosis not present

## 2015-11-21 DIAGNOSIS — I10 Essential (primary) hypertension: Secondary | ICD-10-CM | POA: Diagnosis not present

## 2015-11-21 DIAGNOSIS — E785 Hyperlipidemia, unspecified: Secondary | ICD-10-CM | POA: Diagnosis not present

## 2015-11-21 DIAGNOSIS — N3001 Acute cystitis with hematuria: Secondary | ICD-10-CM | POA: Diagnosis not present

## 2015-11-21 DIAGNOSIS — M1990 Unspecified osteoarthritis, unspecified site: Secondary | ICD-10-CM | POA: Diagnosis not present

## 2015-11-21 DIAGNOSIS — R300 Dysuria: Secondary | ICD-10-CM | POA: Diagnosis not present

## 2015-11-30 DIAGNOSIS — I10 Essential (primary) hypertension: Secondary | ICD-10-CM | POA: Diagnosis not present

## 2015-11-30 DIAGNOSIS — E785 Hyperlipidemia, unspecified: Secondary | ICD-10-CM | POA: Diagnosis not present

## 2015-12-06 DIAGNOSIS — C44519 Basal cell carcinoma of skin of other part of trunk: Secondary | ICD-10-CM | POA: Diagnosis not present

## 2015-12-14 DIAGNOSIS — R399 Unspecified symptoms and signs involving the genitourinary system: Secondary | ICD-10-CM | POA: Diagnosis not present

## 2015-12-27 DIAGNOSIS — M961 Postlaminectomy syndrome, not elsewhere classified: Secondary | ICD-10-CM | POA: Diagnosis not present

## 2015-12-27 DIAGNOSIS — M5136 Other intervertebral disc degeneration, lumbar region: Secondary | ICD-10-CM | POA: Diagnosis not present

## 2015-12-27 DIAGNOSIS — M47816 Spondylosis without myelopathy or radiculopathy, lumbar region: Secondary | ICD-10-CM | POA: Diagnosis not present

## 2015-12-27 DIAGNOSIS — M7062 Trochanteric bursitis, left hip: Secondary | ICD-10-CM | POA: Diagnosis not present

## 2016-01-16 DIAGNOSIS — N200 Calculus of kidney: Secondary | ICD-10-CM | POA: Diagnosis not present

## 2016-01-18 DIAGNOSIS — Z1231 Encounter for screening mammogram for malignant neoplasm of breast: Secondary | ICD-10-CM | POA: Diagnosis not present

## 2016-01-24 DIAGNOSIS — Z96652 Presence of left artificial knee joint: Secondary | ICD-10-CM | POA: Diagnosis not present

## 2016-01-24 DIAGNOSIS — Z471 Aftercare following joint replacement surgery: Secondary | ICD-10-CM | POA: Diagnosis not present

## 2016-01-24 DIAGNOSIS — M7632 Iliotibial band syndrome, left leg: Secondary | ICD-10-CM | POA: Diagnosis not present

## 2016-01-24 DIAGNOSIS — M7062 Trochanteric bursitis, left hip: Secondary | ICD-10-CM | POA: Diagnosis not present

## 2016-01-28 DIAGNOSIS — Z23 Encounter for immunization: Secondary | ICD-10-CM | POA: Diagnosis not present

## 2016-02-22 DIAGNOSIS — M5137 Other intervertebral disc degeneration, lumbosacral region: Secondary | ICD-10-CM | POA: Diagnosis not present

## 2016-02-22 DIAGNOSIS — G894 Chronic pain syndrome: Secondary | ICD-10-CM | POA: Diagnosis not present

## 2016-02-22 DIAGNOSIS — M5136 Other intervertebral disc degeneration, lumbar region: Secondary | ICD-10-CM | POA: Diagnosis not present

## 2016-02-22 DIAGNOSIS — M545 Low back pain: Secondary | ICD-10-CM | POA: Diagnosis not present

## 2016-02-26 ENCOUNTER — Encounter: Payer: Self-pay | Admitting: Interventional Cardiology

## 2016-03-05 DIAGNOSIS — M25562 Pain in left knee: Secondary | ICD-10-CM | POA: Diagnosis not present

## 2016-03-10 ENCOUNTER — Encounter: Payer: Self-pay | Admitting: Interventional Cardiology

## 2016-03-10 ENCOUNTER — Ambulatory Visit (INDEPENDENT_AMBULATORY_CARE_PROVIDER_SITE_OTHER): Payer: Medicare Other | Admitting: Interventional Cardiology

## 2016-03-10 VITALS — BP 126/82 | HR 70 | Ht 62.0 in | Wt 162.6 lb

## 2016-03-10 DIAGNOSIS — E782 Mixed hyperlipidemia: Secondary | ICD-10-CM

## 2016-03-10 DIAGNOSIS — I10 Essential (primary) hypertension: Secondary | ICD-10-CM | POA: Diagnosis not present

## 2016-03-10 NOTE — Progress Notes (Signed)
Patient ID: Becky Duran, female   DOB: 04/26/1933, 82 y.o.   MRN: [id number]      Cardiology Office Note   Date:  03/10/2016   ID:  Becky Duran, DOB 1932/08/17, MRN [id number]  PCP:  [name], MD    No chief complaint on file. f/u HTN   Wt Readings from Last 3 Encounters:  03/10/16 73.8 kg (162 lb 9.6 oz)  06/21/15 73.5 kg (162 lb)  03/01/15 78 kg (172 lb)       History of Present Illness: Becky Duran is a 82 y.o. female who has had difficult to control BP.BP readings are now controlled well. Walking limited by back pain. Had temporary relief from stimulator placed in 2016.  Only walking in the grocery store with a cart. She received steroid injections as well at one point but they seemed to stop working.  Now taking oxycodone over the past few months for pain along with acetaminophen.    When she gets a headache, BP tends to be high.  Over the past year, no headaches and BP has ben well controlled when she checks.  Tolerating current meds.     Past Medical History:  Diagnosis Date  . Chronic low back pain   . CKD (chronic kidney disease), stage II   . Depression   . GERD (gastroesophageal reflux disease)   . Heart murmur   . Hiatal hernia   . History of endometriosis   . History of kidney stones   . Hyperlipidemia   . Hypertension cardiologist-  dr Irish Lack   pt states intermittant bp spike at night cause headache  . Hypothyroidism   . Narcotic drug use   . Nocturia   . Osteoarthritis   . Osteopenia   . Psoriasis   . Renal calculus, bilateral   . Scoliosis   . Wears contact lenses    right eye only    Past Surgical History:  Procedure Laterality Date  . ABDOMINAL HYSTERECTOMY  age 57  . BREAST LUMPECTOMY  1990's    bengin  . CARDIAC CATHETERIZATION  01-23-2010  dr Irish Lack   no hemodynamically significant CAD/  normal LVF  . CARDIOVASCULAR STRESS TEST  08-16-2008  dr Irish Lack   normal nuclear perfusion study/  no ischemia/  normal LV  function and wall motion, ef 94%  . CATARACT EXTRACTION W/ INTRAOCULAR LENS  IMPLANT, BILATERAL  2001  . colonscopy    . CYSTOSCOPY W/ URETERAL STENT REMOVAL N/A 06/21/2015   Procedure: CYSTOSCOPY WITH STENT REMOVAL;  Surgeon: Franchot Gallo, MD;  Location: [facility];  Service: Urology;  Laterality: N/A;  . CYSTOSCOPY WITH URETEROSCOPY AND STENT PLACEMENT Bilateral 06/21/2015   Procedure: CYSTOSCOPY WITH URETEROSCOPY AND  STENT PLACEMENT;  Surgeon: Franchot Gallo, MD;  Location: [facility];  Service: Urology;  Laterality: Bilateral;  . EXTRACORPOREAL SHOCK WAVE LITHOTRIPSY  03-09-2014  . HOLMIUM LASER APPLICATION N/A [id number]   Procedure: HOLMIUM LASER APPLICATION;  Surgeon: Franchot Gallo, MD;  Location[facility];  Service: Urology;  Laterality: N/A;  . LIPOMA EXCISION  02-05-2011   Back  . LUMBAR LAMINECTOMY/DECOMPRESSION MICRODISCECTOMY  06/04/2011   Procedure: LUMBAR LAMINECTOMY/DECOMPRESSION MICRODISCECTOMY;  Surgeon: Floyce Stakes, MD;  Location: [place] [facility];  Service: Neurosurgery;  Laterality: N/A;  Lumbar Four-Five partial Lumbar Three Laminectomy  . PERCUTANEOUS NEPHROSTOLITHOTOMY Left 09-19-2009  . TOTAL KNEE ARTHROPLASTY Left 03/21/2014   Procedure: TOTAL KNEE ARTHROPLASTY;  Surgeon: Mauri Pole, MD;  Location:  [facility];  Service: Orthopedics;  Laterality: Left;  left femoral nerve block  . TRANSTHORACIC ECHOCARDIOGRAM  12-28-2009   normal LVF, ef 60-65%/  mild AV sclerosis without stenosis/  mild AR, MR and TR/  trace PR     Current Outpatient Prescriptions  Medication Sig Dispense Refill  . acetaminophen (TYLENOL ARTHRITIS PAIN) 650 MG CR tablet Take 650 mg by mouth 2 (two) times daily.    Marland Kitchen amLODipine (NORVASC) 5 MG tablet Take 5 mg by mouth every morning.     Marland Kitchen aspirin 81 MG chewable tablet Chew 81 mg by mouth daily.     Marland Kitchen atorvastatin (LIPITOR) 20 MG tablet Take 20 mg by mouth every evening.     Marland Kitchen b complex  vitamins tablet Take 1 tablet by mouth every morning.    . clarithromycin (BIAXIN) 500 MG tablet TK 1 T PO 1 HOUR PRIOR TO DENTAL WORK  4  . doxycycline (VIBRA-TABS) 100 MG tablet Take 1 tablet (100 mg total) by mouth 2 (two) times daily. 10 tablet 0  . FLUoxetine (PROZAC) 20 MG capsule Take 20 mg by mouth every morning.     Marland Kitchen levothyroxine (SYNTHROID, LEVOTHROID) 75 MCG tablet Take 75 mcg by mouth daily before breakfast.     . Liniments (BLUE-EMU SUPER STRENGTH) CREA Apply to back as needed for pain.    Marland Kitchen loratadine (CLARITIN) 10 MG tablet Take 10 mg by mouth daily as needed for allergies.     Marland Kitchen losartan (COZAAR) 50 MG tablet Take 50 mg by mouth every evening.     . Multiple Vitamin (MULTIVITAMIN WITH MINERALS) TABS tablet Take 1 tablet by mouth every morning.    . Oxycodone HCl 10 MG TABS Take 10 mg by mouth every 5 (five) hours as needed (pain).     . Probiotic Product (PROBIOTIC DAILY PO) Take 1 tablet by mouth every morning.    . ranitidine (ZANTAC) 150 MG tablet Take 150 mg by mouth daily. MORNING    . vitamin B-12 (CYANOCOBALAMIN) 1000 MCG tablet Take 1,000 mcg by mouth every morning.     No current facility-administered medications for this visit.     Allergies:   Colace [docusate sodium]; Etodolac; Penicillins; Relafen [nabumetone]; Valacyclovir hcl; Diclofenac sodium; Ciprofloxacin; and Sulfa antibiotics    Social History:  The patient  reports that she has never smoked. She has never used smokeless tobacco. She reports that she does not drink alcohol or use drugs.   Family History:  The patient's family history includes Anesthesia problems in her mother; Heart attack in her brother; Hypertension in her brother, mother, and sister; Stroke (age of onset: 7) in her maternal grandmother; Stroke (age of onset: 22) in her maternal grandfather.    ROS:  Please see the history of present illness.   Otherwise, review of systems are positive for back pain.  Home BP readings in the 120/80  range.   All other systems are reviewed and negative.    PHYSICAL EXAM: VS:  BP 126/82   Pulse 70   Ht 5\' 2"  (1.575 m)   Wt 73.8 kg (162 lb 9.6 oz)   SpO2 99%   BMI 29.74 kg/m  , BMI Body mass index is 29.74 kg/m. GEN: Well nourished, well developed, in no acute distress  HEENT: normal  Neck: no JVD, carotid bruits, or masses Cardiac: RRR; 2/6 early murmur,  norubs, or gallops,no edema  Respiratory:  clear to auscultation bilaterally, normal work of breathing GI: soft, nontender, nondistended, +  BS MS: no deformity or atrophy  Skin: warm and dry, no rash Neuro:  Strength and sensation are intact Psych: euthymic mood, full affect   EKG:   The ekg ordered today demonstrates NSR, PRWP, no ST segment changes   Recent Labs: 06/23/2015: BUN 19; Creatinine, Ser 1.18; Hemoglobin 12.6; Platelets 290; Potassium 3.8; Sodium 140   Lipid Panel    Component Value Date/Time   CHOL  12/26/2009 0335    167        ATP III CLASSIFICATION:  <200     mg/dL   Desirable  200-239  mg/dL   Borderline High  >=240    mg/dL   High          TRIG 68 12/26/2009 0335   HDL 56 12/26/2009 0335   CHOLHDL 3.0 12/26/2009 0335   VLDL 14 12/26/2009 0335   LDLCALC  12/26/2009 0335    97        Total Cholesterol/HDL:CHD Risk Coronary Heart Disease Risk Table                     Men   Women  1/2 Average Risk   3.4   3.3  Average Risk       5.0   4.4  2 X Average Risk   9.6   7.1  3 X Average Risk  23.4   11.0        Use the calculated Patient Ratio above and the CHD Risk Table to determine the patient's CHD Risk.        ATP III CLASSIFICATION (LDL):  <100     mg/dL   Optimal  100-129  mg/dL   Near or Above                    Optimal  130-159  mg/dL   Borderline  160-189  mg/dL   High  >190     mg/dL   Very High     Other studies Reviewed: Additional studies/ records that were reviewed today with results demonstrating: prior med recommendations. Bystolic caused nightmares.   ASSESSMENT  AND PLAN:  1. HTN: COntrolled. COntinue current meds.  Tolerating lower dose of Norvasc with adequate BP control.  When it was decreased to 2.5 mg daily, BP increased. COntinue 5 mg daily. 2. Back pain:  temporary stimulator placed in 2016 but stopped working a few months ago.  She is going to see if this can be moved. 3. Hyperlipidemia: Continue atorvastatin.  Checked with Dr. Kenton Kingfisher. 4. Try recumbent bike for exercise if knee problems allow.   Current medicines are reviewed at length with the patient today.  The patient concerns regarding her medicines were addressed.  The following changes have been made:  No change  Labs/ tests ordered today include:  No orders of the defined types were placed in this encounter.   Recommend 150 minutes/week of aerobic exercise Low fat, low carb, high fiber diet recommended  Disposition:   FU in  1 year   Signed, Larae Grooms, MD  03/10/2016 12:24 PM    [facility] [address], [place], [place]  [address] Phone: [phone]; Fax: [phone]

## 2016-03-10 NOTE — Patient Instructions (Signed)
**Note De-identified  Obfuscation** Medication Instructions:  Same-no changes  Labwork: None  Testing/Procedures: None  Follow-Up: Your physician wants you to follow-up in: 1 year. You will receive a reminder letter in the mail two months in advance. If you don't receive a letter, please call our office to schedule the follow-up appointment.      If you need a refill on your cardiac medications before your next appointment, please call your pharmacy.   

## 2016-04-08 DIAGNOSIS — R300 Dysuria: Secondary | ICD-10-CM | POA: Diagnosis not present

## 2016-05-03 DIAGNOSIS — H04123 Dry eye syndrome of bilateral lacrimal glands: Secondary | ICD-10-CM | POA: Diagnosis not present

## 2016-06-30 DIAGNOSIS — M961 Postlaminectomy syndrome, not elsewhere classified: Secondary | ICD-10-CM | POA: Diagnosis not present

## 2016-06-30 DIAGNOSIS — Z79891 Long term (current) use of opiate analgesic: Secondary | ICD-10-CM | POA: Diagnosis not present

## 2016-06-30 DIAGNOSIS — M5416 Radiculopathy, lumbar region: Secondary | ICD-10-CM | POA: Diagnosis not present

## 2016-06-30 DIAGNOSIS — M47816 Spondylosis without myelopathy or radiculopathy, lumbar region: Secondary | ICD-10-CM | POA: Diagnosis not present

## 2016-07-04 DIAGNOSIS — Z471 Aftercare following joint replacement surgery: Secondary | ICD-10-CM | POA: Diagnosis not present

## 2016-07-04 DIAGNOSIS — Z96652 Presence of left artificial knee joint: Secondary | ICD-10-CM | POA: Diagnosis not present

## 2016-07-15 ENCOUNTER — Encounter: Payer: Self-pay | Admitting: Physical Therapy

## 2016-07-15 ENCOUNTER — Ambulatory Visit: Payer: Medicare Other | Attending: Orthopedic Surgery | Admitting: Physical Therapy

## 2016-07-15 DIAGNOSIS — G8929 Other chronic pain: Secondary | ICD-10-CM | POA: Insufficient documentation

## 2016-07-15 DIAGNOSIS — R262 Difficulty in walking, not elsewhere classified: Secondary | ICD-10-CM | POA: Diagnosis not present

## 2016-07-15 DIAGNOSIS — M25562 Pain in left knee: Secondary | ICD-10-CM | POA: Diagnosis not present

## 2016-07-15 NOTE — Therapy (Signed)
[place], [place], [address] Phone: [phone]   Fax:  [phone]  Physical Therapy Evaluation  Patient Details  Name: Becky Duran MRN: [id number] Date of Birth: 10/11/1932 Referring Provider: Gerrit Halls  Encounter Date: 07/15/2016      PT End of Session - 07/15/16 1431    Visit Number 1   Date for PT Re-Evaluation 09/14/16   PT Start Time 1400   PT Stop Time 1450   PT Time Calculation (min) 50 min   Activity Tolerance Patient limited by pain   Behavior During Therapy [facility] for tasks assessed/performed      Past Medical History:  Diagnosis Date  . Chronic low back pain   . CKD (chronic kidney disease), stage II   . Depression   . GERD (gastroesophageal reflux disease)   . Heart murmur   . Hiatal hernia   . History of endometriosis   . History of kidney stones   . Hyperlipidemia   . Hypertension cardiologist-  dr Irish Lack   pt states intermittant bp spike at night cause headache  . Hypothyroidism   . Narcotic drug use   . Nocturia   . Osteoarthritis   . Osteopenia   . Psoriasis   . Renal calculus, bilateral   . Scoliosis   . Wears contact lenses    right eye only    Past Surgical History:  Procedure Laterality Date  . ABDOMINAL HYSTERECTOMY  age 51  . BREAST LUMPECTOMY  1990's    bengin  . CARDIAC CATHETERIZATION  01-23-2010  dr Irish Lack   no hemodynamically significant CAD/  normal LVF  . CARDIOVASCULAR STRESS TEST  08-16-2008  dr Irish Lack   normal nuclear perfusion study/  no ischemia/  normal LV function and wall motion, ef 94%  . CATARACT EXTRACTION W/ INTRAOCULAR LENS  IMPLANT, BILATERAL  2001  . colonscopy    . CYSTOSCOPY W/ URETERAL STENT REMOVAL N/A 06/21/2015   Procedure: CYSTOSCOPY WITH STENT REMOVAL;  Surgeon: Franchot Gallo, MD;  Location: [facility];  Service: Urology;  Laterality: N/A;  . CYSTOSCOPY WITH URETEROSCOPY AND STENT  PLACEMENT Bilateral 06/21/2015   Procedure: CYSTOSCOPY WITH URETEROSCOPY AND  STENT PLACEMENT;  Surgeon: Franchot Gallo, MD;  Location: [facility];  Service: Urology;  Laterality: Bilateral;  . EXTRACORPOREAL SHOCK WAVE LITHOTRIPSY  03-09-2014  . HOLMIUM LASER APPLICATION N/A 08/26/5359   Procedure: HOLMIUM LASER APPLICATION;  Surgeon: Franchot Gallo, MD;  Location: [facility];  Service: Urology;  Laterality: N/A;  . LIPOMA EXCISION  02-05-2011   Back  . LUMBAR LAMINECTOMY/DECOMPRESSION MICRODISCECTOMY  06/04/2011   Procedure: LUMBAR LAMINECTOMY/DECOMPRESSION MICRODISCECTOMY;  Surgeon: Floyce Stakes, MD;  Location: [place] [facility];  Service: Neurosurgery;  Laterality: N/A;  Lumbar Four-Five partial Lumbar Three Laminectomy  . PERCUTANEOUS NEPHROSTOLITHOTOMY Left 09-19-2009  . TOTAL KNEE ARTHROPLASTY Left 03/21/2014   Procedure: TOTAL KNEE ARTHROPLASTY;  Surgeon: Mauri Pole, MD;  Location: [facility];  Service: Orthopedics;  Laterality: Left;  left femoral nerve block  . TRANSTHORACIC ECHOCARDIOGRAM  12-28-2009   normal LVF, ef 60-65%/  mild AV sclerosis without stenosis/  mild AR, MR and TR/  trace PR    There were no vitals filed for this visit.       Subjective Assessment - 07/15/16 1406    Subjective Patient reports left knee pain, she reports that she had a left TKR in 03/2016, she reports that she does  not feel like the pain ever really went away.  She has had x-rays that report all is well and "lined up".     Limitations Standing;Walking;House hold activities   Patient Stated Goals have less pain   Currently in Pain? Yes   Pain Score 6    Pain Location Knee   Pain Orientation Left;Lateral   Pain Descriptors / Indicators Aching;Sore   Pain Type Chronic pain   Pain Onset More than a month ago   Pain Frequency Constant   Aggravating Factors  walking , standing pain will go up to 10/10, weather seems to cause pain   Pain Relieving Factors rest  will ease the pain off as does the pain medicine, 3/10   Effect of Pain on Daily Activities just hurts and has been difficulty walking and resting            [facility] PT Assessment - 07/15/16 0001      Assessment   Medical Diagnosis left knee pain   Referring Provider Gerrit Halls   Onset Date/Surgical Date 07/04/16   Prior Therapy 2 years ago for TKR, also had lumbar surgery in 2013     Precautions   Precautions None   Precaution Comments has a spinal cord stimulator     Balance Screen   Has the patient fallen in the past 6 months No   Has the patient had a decrease in activity level because of a fear of falling?  Yes   Is the patient reluctant to leave their home because of a fear of falling?  No     Home Environment   Additional Comments no stairs, does light housework, she loves to do yardwork     Prior Function   Level of Independence Independent with community mobility with device   Vocation Retired   Leisure likes to garden, does not exercise     Posture/Postural Control   Posture Comments has severe scoliosis     ROM / Strength   AROM / PROM / Strength AROM;PROM;Strength     AROM   Overall AROM Comments left ankel WNLs   AROM Assessment Site Knee   Right/Left Knee Left   Left Knee Extension 5   Left Knee Flexion 100     PROM   PROM Assessment Site Knee   Right/Left Knee Left   Left Knee Extension 0   Left Knee Flexion 107     Strength   Overall Strength Comments knee and ankle 4/5 without increase of pain     Flexibility   Soft Tissue Assessment /Muscle Length --  mild LE tightness of the mms     Palpation   Palpation comment very tender left lateral knee and shin area along the origin of the anterior tibialis     Ambulation/Gait   Gait Comments uses a SPC, stooped and scoliotic posture, slow, antalgic on the left     Standardized Balance Assessment   Standardized Balance Assessment Timed Up and Go Test     Timed Up and Go Test   Normal TUG  (seconds) 22                   [facility] Adult PT Treatment/Exercise - 07/15/16 0001      Modalities   Modalities Electrical Stimulation;Moist Heat;Iontophoresis     Moist Heat Therapy   Number Minutes Moist Heat 15 Minutes   Moist Heat Location Knee     Electrical Stimulation   Electrical Stimulation Location left lateral  and inferior knee area   Electrical Stimulation Action IFC   Electrical Stimulation Parameters sitting   Electrical Stimulation Goals Pain     Iontophoresis   Type of Iontophoresis Dexamethasone   Location left anterior tibialis origin   Dose 51mA   Time 4 hour patch #1                  PT Short Term Goals - 07/15/16 1436      PT SHORT TERM GOAL #1   Title independent with initial HEP   Time 2   Period Weeks   Status New           PT Long Term Goals - 07/15/16 1436      PT LONG TERM GOAL #1   Title decrease pain 25%   Time 8   Period Weeks   Status New     PT LONG TERM GOAL #2   Title report walking 200 feet without rest and without pain >4/10   Time 8   Period Weeks   Status New     PT LONG TERM GOAL #3   Title decrease TUG time to 16 seconds   Period Weeks   Status New               Plan - 07/15/16 1432    Clinical Impression Statement Patient with a history of left TKR in 2015, she reports that the pain never really stopped.  She has good ROM and strength, she has a history of significant back pain with severe scoliosis,  She walks with a SPC antalgic on the left, slow, stooped posture.  TUG time was 22 seconds, she is very tender at the origin of the anterior tibialis mms,  MD felt like there may be some tendonitis here   Rehab Potential Fair   PT Frequency 2x / week   PT Duration 8 weeks   PT Treatment/Interventions ADLs/Self Care Home Management;Cryotherapy;Electrical Stimulation;Iontophoresis 4mg /ml Dexamethasone;Moist Heat;Ultrasound;Gait training;Therapeutic activities;Therapeutic exercise;Manual  techniques;Taping   PT Next Visit Plan see if she noticed any relief of pain, could try [place] and tape, be careful with her back   Consulted and Agree with Plan of Care Patient      Patient will benefit from skilled therapeutic intervention in order to improve the following deficits and impairments:  Abnormal gait, Decreased activity tolerance, Decreased mobility, Decreased balance, Decreased endurance, Decreased strength, Difficulty walking, Impaired flexibility, Postural dysfunction, Improper body mechanics  Visit Diagnosis: Chronic pain of left knee - Plan: PT plan of care cert/re-cert  Difficulty in walking, not elsewhere classified - Plan: PT plan of care cert/re-cert      G-Codes - 16/96/78 1438    Functional Assessment Tool Used (Outpatient Only) foto 70% limitation   Functional Limitation Mobility: Walking and moving around   Mobility: Walking and Moving Around Current Status (L3810) At least 60 percent but less than 80 percent impaired, limited or restricted   Mobility: Walking and Moving Around Goal Status [phone]) At least 40 percent but less than 60 percent impaired, limited or restricted       Problem List Patient Active Problem List   Diagnosis Date Noted  . Obese 03/22/2014  . S/P left TKA 03/21/2014  . S/P knee replacement 03/21/2014  . Depression   . GERD (gastroesophageal reflux disease)   . Thyroid disease   . Hyperlipidemia   . Osteoarthritis   . Osteopenia   . Scoliosis   . Kidney stones   . Hypothyroidism   .  Hypertension   . H/O hiatal hernia   . Psoriasis     Sumner Boast., PT 07/15/2016, 2:41 PM  [place], [place], [address] Phone: [phone]   Fax:  [phone]  Name: Becky Duran MRN: [id number] Date of Birth: 1932/09/27

## 2016-07-21 ENCOUNTER — Ambulatory Visit: Payer: Medicare Other | Admitting: Physical Therapy

## 2016-07-21 ENCOUNTER — Encounter: Payer: Self-pay | Admitting: Physical Therapy

## 2016-07-21 DIAGNOSIS — M25562 Pain in left knee: Principal | ICD-10-CM

## 2016-07-21 DIAGNOSIS — R262 Difficulty in walking, not elsewhere classified: Secondary | ICD-10-CM | POA: Diagnosis not present

## 2016-07-21 DIAGNOSIS — G8929 Other chronic pain: Secondary | ICD-10-CM | POA: Diagnosis not present

## 2016-07-21 NOTE — Therapy (Signed)
[place], [place], [address] Phone: [phone]   Fax:  [phone]  Physical Therapy Treatment  Patient Details  Name: Becky Duran MRN: [id number] Date of Birth: 31-Oct-1932 Referring Provider: Gerrit Halls  Encounter Date: 07/21/2016      PT End of Session - 07/21/16 1419    Visit Number 2   Date for PT Re-Evaluation 09/14/16   PT Start Time 1345   PT Stop Time 1440   PT Time Calculation (min) 55 min   Activity Tolerance Patient tolerated treatment well   Behavior During Therapy [facility] for tasks assessed/performed      Past Medical History:  Diagnosis Date  . Chronic low back pain   . CKD (chronic kidney disease), stage II   . Depression   . GERD (gastroesophageal reflux disease)   . Heart murmur   . Hiatal hernia   . History of endometriosis   . History of kidney stones   . Hyperlipidemia   . Hypertension cardiologist-  dr Irish Lack   pt states intermittant bp spike at night cause headache  . Hypothyroidism   . Narcotic drug use   . Nocturia   . Osteoarthritis   . Osteopenia   . Psoriasis   . Renal calculus, bilateral   . Scoliosis   . Wears contact lenses    right eye only    Past Surgical History:  Procedure Laterality Date  . ABDOMINAL HYSTERECTOMY  age 41  . BREAST LUMPECTOMY  1990's    bengin  . CARDIAC CATHETERIZATION  01-23-2010  dr Irish Lack   no hemodynamically significant CAD/  normal LVF  . CARDIOVASCULAR STRESS TEST  08-16-2008  dr Irish Lack   normal nuclear perfusion study/  no ischemia/  normal LV function and wall motion, ef 94%  . CATARACT EXTRACTION W/ INTRAOCULAR LENS  IMPLANT, BILATERAL  2001  . colonscopy    . CYSTOSCOPY W/ URETERAL STENT REMOVAL N/A 06/21/2015   Procedure: CYSTOSCOPY WITH STENT REMOVAL;  Surgeon: Franchot Gallo, MD;  Location: [facility];  Service: Urology;  Laterality: N/A;  . CYSTOSCOPY WITH URETEROSCOPY AND  STENT PLACEMENT Bilateral 06/21/2015   Procedure: CYSTOSCOPY WITH URETEROSCOPY AND  STENT PLACEMENT;  Surgeon: Franchot Gallo, MD;  Location: [facility];  Service: Urology;  Laterality: Bilateral;  . EXTRACORPOREAL SHOCK WAVE LITHOTRIPSY  03-09-2014  . HOLMIUM LASER APPLICATION N/A 4/58/0998   Procedure: HOLMIUM LASER APPLICATION;  Surgeon: Franchot Gallo, MD;  Location: [facility];  Service: Urology;  Laterality: N/A;  . LIPOMA EXCISION  02-05-2011   Back  . LUMBAR LAMINECTOMY/DECOMPRESSION MICRODISCECTOMY  06/04/2011   Procedure: LUMBAR LAMINECTOMY/DECOMPRESSION MICRODISCECTOMY;  Surgeon: Floyce Stakes, MD;  Location: [place] [facility];  Service: Neurosurgery;  Laterality: N/A;  Lumbar Four-Five partial Lumbar Three Laminectomy  . PERCUTANEOUS NEPHROSTOLITHOTOMY Left 09-19-2009  . TOTAL KNEE ARTHROPLASTY Left 03/21/2014   Procedure: TOTAL KNEE ARTHROPLASTY;  Surgeon: Mauri Pole, MD;  Location[facility];  Service: Orthopedics;  Laterality: Left;  left femoral nerve block  . TRANSTHORACIC ECHOCARDIOGRAM  12-28-2009   normal LVF, ef 60-65%/  mild AV sclerosis without stenosis/  mild AR, MR and TR/  trace PR    There were no vitals filed for this visit.      Subjective Assessment - 07/21/16 1349    Subjective That day I had that pad on it helped, I was able to sleep that night. It hurting in a bigger  place than it was"   Currently in Pain? Yes   Pain Score 6    Pain Location Knee   Pain Orientation Left;Lateral                         [facility] Adult PT Treatment/Exercise - 07/21/16 0001      Exercises   Exercises Knee/Hip     Knee/Hip Exercises: Stretches   Passive Hamstring Stretch 5 reps;Left;10 seconds  Some STM to L ITB     Knee/Hip Exercises: Aerobic   Nustep L2 x 6 min      Knee/Hip Exercises: Seated   Long Arc Quad 2 sets;10 reps;Left   Ball Squeeze 2x10 with 3sec hold   Hamstring Curl 2 sets;Left;Strengthening;15 reps    Hamstring Limitations yellow Tband   Abduction/Adduction  Both;2 sets;15 reps   Abd/Adduction Limitations yellow Tband     Moist Heat Therapy   Number Minutes Moist Heat 15 Minutes   Moist Heat Location Knee     Electrical Stimulation   Electrical Stimulation Location left lateral and inferior knee area   Electrical Stimulation Action IFC   Electrical Stimulation Parameters sitting     Iontophoresis   Type of Iontophoresis Dexamethasone   Location left anterior tibialis origin   Dose 80mA   Time 4 hour patch #1                  PT Short Term Goals - 07/15/16 1436      PT SHORT TERM GOAL #1   Title independent with initial HEP   Time 2   Period Weeks   Status New           PT Long Term Goals - 07/15/16 1436      PT LONG TERM GOAL #1   Title decrease pain 25%   Time 8   Period Weeks   Status New     PT LONG TERM GOAL #2   Title report walking 200 feet without rest and without pain >4/10   Time 8   Period Weeks   Status New     PT LONG TERM GOAL #3   Title decrease TUG time to 16 seconds   Period Weeks   Status New               Plan - 07/21/16 1420    Clinical Impression Statement Pt tolerated seated exercises that isolated her LLE well. After exercises she reports "I think you have worked some of the pain away" Good knee and HS flexibility. Reports ionto helped.   Rehab Potential Fair   PT Frequency 2x / week   PT Duration 8 weeks   PT Treatment/Interventions ADLs/Self Care Home Management;Cryotherapy;Electrical Stimulation;Iontophoresis 4mg /ml Dexamethasone;Moist Heat;Ultrasound;Gait training;Therapeutic activities;Therapeutic exercise;Manual techniques;Taping   PT Next Visit Plan see if she noticed any relief of pain, could try [place] and tape, be careful with her back      Patient will benefit from skilled therapeutic intervention in order to improve the following deficits and impairments:  Abnormal gait, Decreased activity tolerance,  Decreased mobility, Decreased balance, Decreased endurance, Decreased strength, Difficulty walking, Impaired flexibility, Postural dysfunction, Improper body mechanics  Visit Diagnosis: Chronic pain of left knee  Difficulty in walking, not elsewhere classified     Problem List Patient Active Problem List   Diagnosis Date Noted  . Obese 03/22/2014  . S/P left TKA 03/21/2014  . S/P knee replacement 03/21/2014  . Depression   .  GERD (gastroesophageal reflux disease)   . Thyroid disease   . Hyperlipidemia   . Osteoarthritis   . Osteopenia   . Scoliosis   . Kidney stones   . Hypothyroidism   . Hypertension   . H/O hiatal hernia   . Psoriasis     Scot Jun, PTA 07/21/2016, 2:23 PM  [place], [place], [address] Phone: [phone]   Fax:  [phone]  Name: Becky Duran MRN: [id number] Date of Birth: Mar 22, 1933

## 2016-07-24 ENCOUNTER — Ambulatory Visit: Payer: Medicare Other | Admitting: Physical Therapy

## 2016-07-24 ENCOUNTER — Encounter: Payer: Self-pay | Admitting: Physical Therapy

## 2016-07-24 DIAGNOSIS — G8929 Other chronic pain: Secondary | ICD-10-CM

## 2016-07-24 DIAGNOSIS — M25562 Pain in left knee: Secondary | ICD-10-CM | POA: Diagnosis not present

## 2016-07-24 DIAGNOSIS — R262 Difficulty in walking, not elsewhere classified: Secondary | ICD-10-CM

## 2016-07-24 NOTE — Therapy (Signed)
[place], [place], [address] Phone: [phone]   Fax:  [phone]  Physical Therapy Treatment  Patient Details  Name: Becky Duran MRN: [id number] Date of Birth: [birth year] Referring Provider: Gerrit Halls  Encounter Date: 07/24/2016      PT End of Session - 07/24/16 1437    Visit Number 3   Date for PT Re-Evaluation 09/14/16   PT Start Time 1345   PT Stop Time 1449   PT Time Calculation (min) 64 min   Activity Tolerance Patient tolerated treatment well   Behavior During Therapy [facility] for tasks assessed/performed      Past Medical History:  Diagnosis Date  . Chronic low back pain   . CKD (chronic kidney disease), stage II   . Depression   . GERD (gastroesophageal reflux disease)   . Heart murmur   . Hiatal hernia   . History of endometriosis   . History of kidney stones   . Hyperlipidemia   . Hypertension cardiologist-  dr Irish Lack   pt states intermittant bp spike at night cause headache  . Hypothyroidism   . Narcotic drug use   . Nocturia   . Osteoarthritis   . Osteopenia   . Psoriasis   . Renal calculus, bilateral   . Scoliosis   . Wears contact lenses    right eye only    Past Surgical History:  Procedure Laterality Date  . ABDOMINAL HYSTERECTOMY  age 48  . BREAST LUMPECTOMY  1990's    bengin  . CARDIAC CATHETERIZATION  01-23-2010  dr Irish Lack   no hemodynamically significant CAD/  normal LVF  . CARDIOVASCULAR STRESS TEST  08-16-2008  dr Irish Lack   normal nuclear perfusion study/  no ischemia/  normal LV function and wall motion, ef 94%  . CATARACT EXTRACTION W/ INTRAOCULAR LENS  IMPLANT, BILATERAL  2001  . colonscopy    . CYSTOSCOPY W/ URETERAL STENT REMOVAL N/A 06/21/2015   Procedure: CYSTOSCOPY WITH STENT REMOVAL;  Surgeon: Franchot Gallo, MD;  Location: [facility];  Service: Urology;  Laterality: N/A;  . CYSTOSCOPY WITH URETEROSCOPY AND  STENT PLACEMENT Bilateral 06/21/2015   Procedure: CYSTOSCOPY WITH URETEROSCOPY AND  STENT PLACEMENT;  Surgeon: Franchot Gallo, MD;  Location: [facility];  Service: Urology;  Laterality: Bilateral;  . EXTRACORPOREAL SHOCK WAVE LITHOTRIPSY  03-09-2014  . HOLMIUM LASER APPLICATION N/A 3/41/9379   Procedure: HOLMIUM LASER APPLICATION;  Surgeon: Franchot Gallo, MD;  Location: [facility];  Service: Urology;  Laterality: N/A;  . LIPOMA EXCISION  02-05-2011   Back  . LUMBAR LAMINECTOMY/DECOMPRESSION MICRODISCECTOMY  06/04/2011   Procedure: LUMBAR LAMINECTOMY/DECOMPRESSION MICRODISCECTOMY;  Surgeon: Floyce Stakes, MD;  Location: [place] [facility];  Service: Neurosurgery;  Laterality: N/A;  Lumbar Four-Five partial Lumbar Three Laminectomy  . PERCUTANEOUS NEPHROSTOLITHOTOMY Left 09-19-2009  . TOTAL KNEE ARTHROPLASTY Left 03/21/2014   Procedure: TOTAL KNEE ARTHROPLASTY;  Surgeon: Mauri Pole, MD;  Location: [facility];  Service: Orthopedics;  Laterality: Left;  left femoral nerve block  . TRANSTHORACIC ECHOCARDIOGRAM  12-28-2009   normal LVF, ef 60-65%/  mild AV sclerosis without stenosis/  mild AR, MR and TR/  trace PR    There were no vitals filed for this visit.      Subjective Assessment - 07/24/16 1346    Subjective "My knee is doing better, but my back is a fit, I knew it would"   Currently in Pain? Yes  Pain Score 3   "back pain is always 5 or more over 10"   Pain Location Knee                         [facility] Adult PT Treatment/Exercise - 07/24/16 0001      High Level Balance   High Level Balance Activities Side stepping     Knee/Hip Exercises: Aerobic   Nustep L3 x 6 min      Knee/Hip Exercises: Machines for [employer] geologist presses 1 black 2x15 each     Knee/Hip Exercises: Standing   Other Standing Knee Exercises Standing march 2x10      Knee/Hip Exercises: Seated   Long Arc Quad 2 sets;10 reps;Both   Long  Arc Quad Weight 2 lbs.   Ball Squeeze 2x10 with 3 sec hold   Marching Limitations 2x10   Marching Weights 2 lbs.   Hamstring Curl 2 sets;Strengthening;15 reps   Hamstring Limitations yellow Tband   Abduction/Adduction  Both;2 sets;15 reps   Abd/Adduction Limitations yellow Tband     Modalities   Modalities Electrical Stimulation;Moist Heat;Iontophoresis     Moist Heat Therapy   Number Minutes Moist Heat 15 Minutes   Moist Heat Location Knee     Electrical Stimulation   Electrical Stimulation Location left lateral and inferior knee area   Electrical Stimulation Action IFC   Electrical Stimulation Parameters supine    Electrical Stimulation Goals Pain     Iontophoresis   Type of Iontophoresis Dexamethasone   Location left anterior tibialis origin   Dose 45mA   Time 4 hour patch #3                  PT Short Term Goals - 07/15/16 1436      PT SHORT TERM GOAL #1   Title independent with initial HEP   Time 2   Period Weeks   Status New           PT Long Term Goals - 07/15/16 1436      PT LONG TERM GOAL #1   Title decrease pain 25%   Time 8   Period Weeks   Status New     PT LONG TERM GOAL #2   Title report walking 200 feet without rest and without pain >4/10   Time 8   Period Weeks   Status New     PT LONG TERM GOAL #3   Title decrease TUG time to 16 seconds   Period Weeks   Status New               Plan - 07/24/16 1438    Clinical Impression Statement Pt reports a history of spikes in BP since she was 83 years old, and she said that they usually last a few weeks and go away. Pt just voiced concern about the ionto patch and BP. Explained to pt that ionto is a local pain killer and anti inflammatory. Pt able to complete all of today's exercises. Pt does reports some soreness on lateral L knee with seated abduction.   Rehab Potential Fair   PT Frequency 2x / week   PT Duration 8 weeks   PT Treatment/Interventions ADLs/Self Care Home  Management;Cryotherapy;Electrical Stimulation;Iontophoresis 4mg /ml Dexamethasone;Moist Heat;Ultrasound;Gait training;Therapeutic activities;Therapeutic exercise;Manual techniques;Taping   PT Next Visit Plan continue strengthening for LE's. see if she noticed any relief of pain, could try [place] and tape, be careful with her back  Patient will benefit from skilled therapeutic intervention in order to improve the following deficits and impairments:  Abnormal gait, Decreased activity tolerance, Decreased mobility, Decreased balance, Decreased endurance, Decreased strength, Difficulty walking, Impaired flexibility, Postural dysfunction, Improper body mechanics  Visit Diagnosis: Chronic pain of left knee  Difficulty in walking, not elsewhere classified     Problem List Patient Active Problem List   Diagnosis Date Noted  . Obese 03/22/2014  . S/P left TKA 03/21/2014  . S/P knee replacement 03/21/2014  . Depression   . GERD (gastroesophageal reflux disease)   . Thyroid disease   . Hyperlipidemia   . Osteoarthritis   . Osteopenia   . Scoliosis   . Kidney stones   . Hypothyroidism   . Hypertension   . H/O hiatal hernia   . Psoriasis     Scot Jun, PTA 07/24/2016, 2:46 PM  [place], [place], [address] Phone: [phone]   Fax:  [phone]  Name: Becky Duran MRN: [id number] Date of Birth: Oct 10, 1932

## 2016-07-28 ENCOUNTER — Ambulatory Visit: Payer: Medicare Other | Admitting: Physical Therapy

## 2016-07-28 ENCOUNTER — Encounter: Payer: Self-pay | Admitting: Physical Therapy

## 2016-07-28 DIAGNOSIS — R262 Difficulty in walking, not elsewhere classified: Secondary | ICD-10-CM

## 2016-07-28 DIAGNOSIS — M25562 Pain in left knee: Secondary | ICD-10-CM | POA: Diagnosis not present

## 2016-07-28 DIAGNOSIS — G8929 Other chronic pain: Secondary | ICD-10-CM | POA: Diagnosis not present

## 2016-07-28 NOTE — Therapy (Signed)
[place], [place], [address] Phone: [phone]   Fax:  [phone]  Physical Therapy Treatment  Patient Details  Name: Becky Duran MRN: [id number] Date of Birth: [birth year] Referring Provider: Gerrit Halls  Encounter Date: 07/28/2016      PT End of Session - 07/28/16 1429    Visit Number 4   Date for PT Re-Evaluation 09/14/16   PT Start Time 1345   PT Stop Time 1446   PT Time Calculation (min) 61 min   Activity Tolerance Patient tolerated treatment well   Behavior During Therapy [facility] for tasks assessed/performed      Past Medical History:  Diagnosis Date  . Chronic low back pain   . CKD (chronic kidney disease), stage II   . Depression   . GERD (gastroesophageal reflux disease)   . Heart murmur   . Hiatal hernia   . History of endometriosis   . History of kidney stones   . Hyperlipidemia   . Hypertension cardiologist-  dr Irish Lack   pt states intermittant bp spike at night cause headache  . Hypothyroidism   . Narcotic drug use   . Nocturia   . Osteoarthritis   . Osteopenia   . Psoriasis   . Renal calculus, bilateral   . Scoliosis   . Wears contact lenses    right eye only    Past Surgical History:  Procedure Laterality Date  . ABDOMINAL HYSTERECTOMY  age 83  . BREAST LUMPECTOMY  1990's    bengin  . CARDIAC CATHETERIZATION  01-23-2010  dr Irish Lack   no hemodynamically significant CAD/  normal LVF  . CARDIOVASCULAR STRESS TEST  08-16-2008  dr Irish Lack   normal nuclear perfusion study/  no ischemia/  normal LV function and wall motion, ef 94%  . CATARACT EXTRACTION W/ INTRAOCULAR LENS  IMPLANT, BILATERAL  2001  . colonscopy    . CYSTOSCOPY W/ URETERAL STENT REMOVAL N/A 06/21/2015   Procedure: CYSTOSCOPY WITH STENT REMOVAL;  Surgeon: Franchot Gallo, MD;  Location: [facility];  Service: Urology;  Laterality: N/A;  . CYSTOSCOPY WITH URETEROSCOPY AND  STENT PLACEMENT Bilateral 06/21/2015   Procedure: CYSTOSCOPY WITH URETEROSCOPY AND  STENT PLACEMENT;  Surgeon: Franchot Gallo, MD;  Location: [facility];  Service: Urology;  Laterality: Bilateral;  . EXTRACORPOREAL SHOCK WAVE LITHOTRIPSY  03-09-2014  . HOLMIUM LASER APPLICATION N/A 6/59/9357   Procedure: HOLMIUM LASER APPLICATION;  Surgeon: Franchot Gallo, MD;  Location: [facility];  Service: Urology;  Laterality: N/A;  . LIPOMA EXCISION  02-05-2011   Back  . LUMBAR LAMINECTOMY/DECOMPRESSION MICRODISCECTOMY  06/04/2011   Procedure: LUMBAR LAMINECTOMY/DECOMPRESSION MICRODISCECTOMY;  Surgeon: Floyce Stakes, MD;  Location: [facility] [facility];  Service: Neurosurgery;  Laterality: N/A;  Lumbar Four-Five partial Lumbar Three Laminectomy  . PERCUTANEOUS NEPHROSTOLITHOTOMY Left 09-19-2009  . TOTAL KNEE ARTHROPLASTY Left 03/21/2014   Procedure: TOTAL KNEE ARTHROPLASTY;  Surgeon: Mauri Pole, MD;  Location: [facility];  Service: Orthopedics;  Laterality: Left;  left femoral nerve block  . TRANSTHORACIC ECHOCARDIOGRAM  12-28-2009   normal LVF, ef 60-65%/  mild AV sclerosis without stenosis/  mild AR, MR and TR/  trace PR    There were no vitals filed for this visit.      Subjective Assessment - 07/28/16 1344    Subjective "The knee is better"   Currently in Pain? Yes   Pain Score 3    Pain Location Knee  Pain Orientation Left                         [facility] Adult PT Treatment/Exercise - 07/28/16 0001      Knee/Hip Exercises: Aerobic   Nustep L4 x 6 min      Knee/Hip Exercises: Machines for Strengthening   Cybex Knee Extension 5lb 2x10    Cybex Knee Flexion 20lb 2x10   Other Machine Fitter presses 1 black 2x15 each     Knee/Hip Exercises: Seated   Long Arc Quad 2 sets;10 reps;Both   Long Arc Quad Weight 2 lbs.   Ball Squeeze 2x10 with 3sec hold   Marching Limitations 2x10   Marching Weights 2 lbs.   Hamstring Curl 2 sets;Strengthening;15  reps   Hamstring Limitations red tband   Abduction/Adduction  Both;2 sets;15 reps   Abd/Adduction Limitations red Tband     Modalities   Modalities Electrical Stimulation;Moist Heat;Iontophoresis     Moist Heat Therapy   Number Minutes Moist Heat 15 Minutes     Electrical Stimulation   Electrical Stimulation Location left lateral and inferior knee area   Electrical Stimulation Action IFC   Electrical Stimulation Parameters supine   Electrical Stimulation Goals Pain     Iontophoresis   Type of Iontophoresis Dexamethasone   Location left anterior tibialis origin   Dose 23mA   Time 4 hour patch #4                  PT Short Term Goals - 07/15/16 1436      PT SHORT TERM GOAL #1   Title independent with initial HEP   Time 2   Period Weeks   Status New           PT Long Term Goals - 07/28/16 1357      PT LONG TERM GOAL #1   Title decrease pain 25%   Status Achieved     PT LONG TERM GOAL #2   Title report walking 200 feet without rest and without pain >4/10   Status On-going     PT LONG TERM GOAL #3   Title decrease TUG time to 16 seconds   Status On-going               Plan - 07/28/16 1429    Clinical Impression Statement Pt that her knee feel better overall. PT reports less pain in her knee and had progressed towards some goals. Pt able to progress to machine level interventions, does reports LE fatigue   PT Frequency 2x / week   PT Duration 8 weeks   PT Treatment/Interventions ADLs/Self Care Home Management;Cryotherapy;Electrical Stimulation;Iontophoresis 4mg /ml Dexamethasone;Moist Heat;Ultrasound;Gait training;Therapeutic activities;Therapeutic exercise;Manual techniques;Taping   PT Next Visit Plan continue strengthening fro LE's. see if she noticed any relief of pain, could try [place] and tape, be careful with her back, TUG       Patient will benefit from skilled therapeutic intervention in order to improve the following deficits and  impairments:  Abnormal gait, Decreased activity tolerance, Decreased mobility, Decreased balance, Decreased endurance, Decreased strength, Difficulty walking, Impaired flexibility, Postural dysfunction, Improper body mechanics  Visit Diagnosis: Chronic pain of left knee  Difficulty in walking, not elsewhere classified     Problem List Patient Active Problem List   Diagnosis Date Noted  . Obese 03/22/2014  . S/P left TKA 03/21/2014  . S/P knee replacement 03/21/2014  . Depression   . GERD (gastroesophageal reflux disease)   .  Thyroid disease   . Hyperlipidemia   . Osteoarthritis   . Osteopenia   . Scoliosis   . Kidney stones   . Hypothyroidism   . Hypertension   . H/O hiatal hernia   . Psoriasis     Scot Jun 07/28/2016, 2:35 PM  [place], [place], [address] Phone: [phone]   Fax:  [phone]  Name: Becky Duran MRN: [id number] Date of Birth: 1932-07-31

## 2016-07-31 ENCOUNTER — Ambulatory Visit: Payer: Medicare Other | Admitting: Physical Therapy

## 2016-08-04 DIAGNOSIS — L820 Inflamed seborrheic keratosis: Secondary | ICD-10-CM | POA: Diagnosis not present

## 2016-08-04 DIAGNOSIS — Z85828 Personal history of other malignant neoplasm of skin: Secondary | ICD-10-CM | POA: Diagnosis not present

## 2016-08-04 DIAGNOSIS — L304 Erythema intertrigo: Secondary | ICD-10-CM | POA: Diagnosis not present

## 2016-08-04 DIAGNOSIS — C44519 Basal cell carcinoma of skin of other part of trunk: Secondary | ICD-10-CM | POA: Diagnosis not present

## 2016-08-05 ENCOUNTER — Ambulatory Visit: Payer: Medicare Other | Attending: Orthopedic Surgery | Admitting: Physical Therapy

## 2016-08-05 ENCOUNTER — Encounter: Payer: Self-pay | Admitting: Physical Therapy

## 2016-08-05 DIAGNOSIS — M25562 Pain in left knee: Secondary | ICD-10-CM | POA: Insufficient documentation

## 2016-08-05 DIAGNOSIS — G8929 Other chronic pain: Secondary | ICD-10-CM | POA: Insufficient documentation

## 2016-08-05 DIAGNOSIS — R262 Difficulty in walking, not elsewhere classified: Secondary | ICD-10-CM | POA: Diagnosis not present

## 2016-08-05 DIAGNOSIS — M5442 Lumbago with sciatica, left side: Secondary | ICD-10-CM | POA: Diagnosis not present

## 2016-08-05 NOTE — Therapy (Signed)
[place], [place], [address] Phone: [phone]   Fax:  [phone]  Physical Therapy Treatment  Patient Details  Name: Becky Duran MRN: [id number] Date of Birth: Aug 23, 1932 Referring Provider: Gerrit Halls  Encounter Date: 08/05/2016      PT End of Session - 08/05/16 1425    Visit Number 5   Date for PT Re-Evaluation 09/14/16   PT Start Time 1354   PT Stop Time 1445   PT Time Calculation (min) 51 min   Activity Tolerance Patient tolerated treatment well   Behavior During Therapy [facility] for tasks assessed/performed      Past Medical History:  Diagnosis Date  . Chronic low back pain   . CKD (chronic kidney disease), stage II   . Depression   . GERD (gastroesophageal reflux disease)   . Heart murmur   . Hiatal hernia   . History of endometriosis   . History of kidney stones   . Hyperlipidemia   . Hypertension cardiologist-  dr Irish Lack   pt states intermittant bp spike at night cause headache  . Hypothyroidism   . Narcotic drug use   . Nocturia   . Osteoarthritis   . Osteopenia   . Psoriasis   . Renal calculus, bilateral   . Scoliosis   . Wears contact lenses    right eye only    Past Surgical History:  Procedure Laterality Date  . ABDOMINAL HYSTERECTOMY  age 36  . BREAST LUMPECTOMY  1990's    bengin  . CARDIAC CATHETERIZATION  01-23-2010  dr Irish Lack   no hemodynamically significant CAD/  normal LVF  . CARDIOVASCULAR STRESS TEST  08-16-2008  dr Irish Lack   normal nuclear perfusion study/  no ischemia/  normal LV function and wall motion, ef 94%  . CATARACT EXTRACTION W/ INTRAOCULAR LENS  IMPLANT, BILATERAL  2001  . colonscopy    . CYSTOSCOPY W/ URETERAL STENT REMOVAL N/A 06/21/2015   Procedure: CYSTOSCOPY WITH STENT REMOVAL;  Surgeon: Franchot Gallo, MD;  Location: [facility];  Service: Urology;  Laterality: N/A;  . CYSTOSCOPY WITH URETEROSCOPY AND  STENT PLACEMENT Bilateral 06/21/2015   Procedure: CYSTOSCOPY WITH URETEROSCOPY AND  STENT PLACEMENT;  Surgeon: Franchot Gallo, MD;  Location: [facility];  Service: Urology;  Laterality: Bilateral;  . EXTRACORPOREAL SHOCK WAVE LITHOTRIPSY  03-09-2014  . HOLMIUM LASER APPLICATION N/A 07/24/252   Procedure: HOLMIUM LASER APPLICATION;  Surgeon: Franchot Gallo, MD;  Location: [facility];  Service: Urology;  Laterality: N/A;  . LIPOMA EXCISION  02-05-2011   Back  . LUMBAR LAMINECTOMY/DECOMPRESSION MICRODISCECTOMY  06/04/2011   Procedure: LUMBAR LAMINECTOMY/DECOMPRESSION MICRODISCECTOMY;  Surgeon: Floyce Stakes, MD;  Location: [place];  Service: Neurosurgery;  Laterality: N/A;  Lumbar Four-Five partial Lumbar Three Laminectomy  . PERCUTANEOUS NEPHROSTOLITHOTOMY Left 09-19-2009  . TOTAL KNEE ARTHROPLASTY Left 03/21/2014   Procedure: TOTAL KNEE ARTHROPLASTY;  Surgeon: Mauri Pole, MD;  Location: [facility];  Service: Orthopedics;  Laterality: Left;  left femoral nerve block  . TRANSTHORACIC ECHOCARDIOGRAM  12-28-2009   normal LVF, ef 60-65%/  mild AV sclerosis without stenosis/  mild AR, MR and TR/  trace PR    There were no vitals filed for this visit.      Subjective Assessment - 08/05/16 1358    Subjective Patient reports that she has been miserable since she was here last, reports increased knee pain and much worse low back  pain.                         [facility] Adult PT Treatment/Exercise - 08/05/16 0001      Modalities   Modalities Ultrasound     Moist Heat Therapy   Number Minutes Moist Heat 15 Minutes   Moist Heat Location Lumbar Spine;Knee     Electrical Stimulation   Electrical Stimulation Location left lateral and inferior knee area   Electrical Stimulation Action IFC   Electrical Stimulation Parameters supine   Electrical Stimulation Goals Pain     Ultrasound   Ultrasound Location left lateral knee/anterior tibialis  area   Ultrasound Parameters 3.3MHz 100% 1.3 w/cm2   Ultrasound Goals Pain     Iontophoresis   Type of Iontophoresis Dexamethasone   Location left anterior tibialis origin   Dose 34mA   Time 4 hour patch #5     Manual Therapy   Manual Therapy Soft tissue mobilization   Soft tissue mobilization gentle STM to the left anterior tibialis origin area                  PT Short Term Goals - 08/05/16 1426      PT SHORT TERM GOAL #1   Title independent with initial HEP   Status Achieved           PT Long Term Goals - 07/28/16 1357      PT LONG TERM GOAL #1   Title decrease pain 25%   Status Achieved     PT LONG TERM GOAL #2   Title report walking 200 feet without rest and without pain >4/10   Status On-going     PT LONG TERM GOAL #3   Title decrease TUG time to 16 seconds   Status On-going               Plan - 08/05/16 1425    Clinical Impression Statement Patient reports overall the exercises are causing increased LBP, reports that she and the MD talked and fel like she may not be able to do any exercises due to the severity of her back pain   PT Next Visit Plan add [place] and STM today, could try tape, may have to stop exercises due to the pain in th eback   Consulted and Agree with Plan of Care Patient      Patient will benefit from skilled therapeutic intervention in order to improve the following deficits and impairments:  Abnormal gait, Decreased activity tolerance, Decreased mobility, Decreased balance, Decreased endurance, Decreased strength, Difficulty walking, Impaired flexibility, Postural dysfunction, Improper body mechanics  Visit Diagnosis: Chronic pain of left knee  Difficulty in walking, not elsewhere classified     Problem List Patient Active Problem List   Diagnosis Date Noted  . Obese 03/22/2014  . S/P left TKA 03/21/2014  . S/P knee replacement 03/21/2014  . Depression   . GERD (gastroesophageal reflux disease)   . Thyroid  disease   . Hyperlipidemia   . Osteoarthritis   . Osteopenia   . Scoliosis   . Kidney stones   . Hypothyroidism   . Hypertension   . H/O hiatal hernia   . Psoriasis     Sumner Boast., PT 08/05/2016, 2:27 PM  [place], [place], [address] Phone: [phone]   Fax:  [phone]  Name: Becky Duran MRN: [id number] Date of Birth: 22-Jun-1932

## 2016-08-07 ENCOUNTER — Encounter: Payer: Self-pay | Admitting: Rehabilitation

## 2016-08-07 ENCOUNTER — Ambulatory Visit: Payer: Medicare Other | Admitting: Rehabilitation

## 2016-08-07 DIAGNOSIS — G8929 Other chronic pain: Secondary | ICD-10-CM | POA: Diagnosis not present

## 2016-08-07 DIAGNOSIS — R262 Difficulty in walking, not elsewhere classified: Secondary | ICD-10-CM | POA: Diagnosis not present

## 2016-08-07 DIAGNOSIS — M25562 Pain in left knee: Secondary | ICD-10-CM | POA: Diagnosis not present

## 2016-08-07 DIAGNOSIS — M5442 Lumbago with sciatica, left side: Secondary | ICD-10-CM | POA: Diagnosis not present

## 2016-08-07 NOTE — Therapy (Signed)
[place], [place], [address] Phone: [phone]   Fax:  [phone]  Physical Therapy Treatment  Patient Details  Name: Becky Duran MRN: [id number] Date of Birth: Sep 23, 1932 Referring Provider: Gerrit Halls  Encounter Date: 08/07/2016      PT End of Session - 08/07/16 1345    Visit Number 6   Date for PT Re-Evaluation 09/14/16   PT Start Time 1315   PT Stop Time 1400   PT Time Calculation (min) 45 min   Activity Tolerance Patient tolerated treatment well      Past Medical History:  Diagnosis Date  . Chronic low back pain   . CKD (chronic kidney disease), stage II   . Depression   . GERD (gastroesophageal reflux disease)   . Heart murmur   . Hiatal hernia   . History of endometriosis   . History of kidney stones   . Hyperlipidemia   . Hypertension cardiologist-  dr Irish Lack   pt states intermittant bp spike at night cause headache  . Hypothyroidism   . Narcotic drug use   . Nocturia   . Osteoarthritis   . Osteopenia   . Psoriasis   . Renal calculus, bilateral   . Scoliosis   . Wears contact lenses    right eye only    Past Surgical History:  Procedure Laterality Date  . ABDOMINAL HYSTERECTOMY  age 38  . BREAST LUMPECTOMY  1990's    bengin  . CARDIAC CATHETERIZATION  01-23-2010  dr Irish Lack   no hemodynamically significant CAD/  normal LVF  . CARDIOVASCULAR STRESS TEST  08-16-2008  dr Irish Lack   normal nuclear perfusion study/  no ischemia/  normal LV function and wall motion, ef 94%  . CATARACT EXTRACTION W/ INTRAOCULAR LENS  IMPLANT, BILATERAL  2001  . colonscopy    . CYSTOSCOPY W/ URETERAL STENT REMOVAL N/A 06/21/2015   Procedure: CYSTOSCOPY WITH STENT REMOVAL;  Surgeon: Franchot Gallo, MD;  Location: [facility];  Service: Urology;  Laterality: N/A;  . CYSTOSCOPY WITH URETEROSCOPY AND STENT PLACEMENT Bilateral 06/21/2015   Procedure: CYSTOSCOPY  WITH URETEROSCOPY AND  STENT PLACEMENT;  Surgeon: Franchot Gallo, MD;  Location: [facility];  Service: Urology;  Laterality: Bilateral;  . EXTRACORPOREAL SHOCK WAVE LITHOTRIPSY  03-09-2014  . HOLMIUM LASER APPLICATION N/A 2/72/5366   Procedure: HOLMIUM LASER APPLICATION;  Surgeon: Franchot Gallo, MD;  Location: [facility];  Service: Urology;  Laterality: N/A;  . LIPOMA EXCISION  02-05-2011   Back  . LUMBAR LAMINECTOMY/DECOMPRESSION MICRODISCECTOMY  06/04/2011   Procedure: LUMBAR LAMINECTOMY/DECOMPRESSION MICRODISCECTOMY;  Surgeon: Floyce Stakes, MD;  Location: [place] [facility];  Service: Neurosurgery;  Laterality: N/A;  Lumbar Four-Five partial Lumbar Three Laminectomy  . PERCUTANEOUS NEPHROSTOLITHOTOMY Left 09-19-2009  . TOTAL KNEE ARTHROPLASTY Left 03/21/2014   Procedure: TOTAL KNEE ARTHROPLASTY;  Surgeon: Mauri Pole, MD;  Location: [facility];  Service: Orthopedics;  Laterality: Left;  left femoral nerve block  . TRANSTHORACIC ECHOCARDIOGRAM  12-28-2009   normal LVF, ef 60-65%/  mild AV sclerosis without stenosis/  mild AR, MR and TR/  trace PR    There were no vitals filed for this visit.      Subjective Assessment - 08/07/16 1319    Subjective Reports last visit was helpful.  Still having the back and knee pain today.  Would like to attempt again per last treatment.     Pain Score 5  Pain Location Knee   Pain Orientation Left   Pain Descriptors / Indicators Aching   Pain Type Chronic pain                         [facility] Adult PT Treatment/Exercise - 08/07/16 0001      Moist Heat Therapy   Number Minutes Moist Heat 15 Minutes   Moist Heat Location Lumbar Spine     Electrical Stimulation   Electrical Stimulation Location left lateral and inferior knee area   Electrical Stimulation Action ifc   Electrical Stimulation Parameters supine   Electrical Stimulation Goals Pain     Ultrasound   Ultrasound Location L lateral  knee/ant tib area   Ultrasound Parameters 3.3 100%, 1.3w/cm2   Ultrasound Goals Pain     Iontophoresis   Type of Iontophoresis Dexamethasone   Location left anterior tibialis origin   Dose 61mA   Time 4 hour patch #6     Manual Therapy   Manual Therapy Soft tissue mobilization   Soft tissue mobilization gentle STM to the left anterior tibialis origin area                  PT Short Term Goals - 08/05/16 1426      PT SHORT TERM GOAL #1   Title independent with initial HEP   Status Achieved           PT Long Term Goals - 07/28/16 1357      PT LONG TERM GOAL #1   Title decrease pain 25%   Status Achieved     PT LONG TERM GOAL #2   Title report walking 200 feet without rest and without pain >4/10   Status On-going     PT LONG TERM GOAL #3   Title decrease TUG time to 16 seconds   Status On-going               Plan - 08/07/16 1346    Clinical Impression Statement Had decreased pain following last treatment; still feeling she will be unable to do any exercises.  2-3 ttp L anterior tibialis insertion   PT Next Visit Plan continue pain relief; no more ionto patches      Patient will benefit from skilled therapeutic intervention in order to improve the following deficits and impairments:  Abnormal gait, Decreased activity tolerance, Decreased mobility, Decreased balance, Decreased endurance, Decreased strength, Difficulty walking, Impaired flexibility, Postural dysfunction, Improper body mechanics  Visit Diagnosis: Chronic pain of left knee  Difficulty in walking, not elsewhere classified     Problem List Patient Active Problem List   Diagnosis Date Noted  . Obese 03/22/2014  . S/P left TKA 03/21/2014  . S/P knee replacement 03/21/2014  . Depression   . GERD (gastroesophageal reflux disease)   . Thyroid disease   . Hyperlipidemia   . Osteoarthritis   . Osteopenia   . Scoliosis   . Kidney stones   . Hypothyroidism   . Hypertension   .  H/O hiatal hernia   . Psoriasis     Stark Bray, DPT, CMP 08/07/2016, 1:49 PM  [place], [place], [address] Phone: [phone]   Fax:  [phone]  Name: Becky Duran MRN: [id number] Date of Birth: March 08, 1933

## 2016-08-12 ENCOUNTER — Encounter: Payer: Self-pay | Admitting: Physical Therapy

## 2016-08-12 ENCOUNTER — Ambulatory Visit: Payer: Medicare Other | Admitting: Physical Therapy

## 2016-08-12 DIAGNOSIS — M25562 Pain in left knee: Principal | ICD-10-CM

## 2016-08-12 DIAGNOSIS — G8929 Other chronic pain: Secondary | ICD-10-CM

## 2016-08-12 DIAGNOSIS — M5442 Lumbago with sciatica, left side: Secondary | ICD-10-CM | POA: Diagnosis not present

## 2016-08-12 DIAGNOSIS — R262 Difficulty in walking, not elsewhere classified: Secondary | ICD-10-CM | POA: Diagnosis not present

## 2016-08-12 NOTE — Therapy (Signed)
[place], [place], [address] Phone: [phone]   Fax:  [phone]  Physical Therapy Treatment  Patient Details  Name: Becky Duran MRN: [id number] Date of Birth: 1976-06-02 Referring Provider: Gerrit Halls  Encounter Date: 08/12/2016      PT End of Session - 08/12/16 1411    Visit Number 7   Date for PT Re-Evaluation 09/14/16   PT Start Time 1345   PT Stop Time 1424   PT Time Calculation (min) 39 min   Activity Tolerance Patient tolerated treatment well   Behavior During Therapy [facility] for tasks assessed/performed      Past Medical History:  Diagnosis Date  . Chronic low back pain   . CKD (chronic kidney disease), stage II   . Depression   . GERD (gastroesophageal reflux disease)   . Heart murmur   . Hiatal hernia   . History of endometriosis   . History of kidney stones   . Hyperlipidemia   . Hypertension cardiologist-  dr Irish Lack   pt states intermittant bp spike at night cause headache  . Hypothyroidism   . Narcotic drug use   . Nocturia   . Osteoarthritis   . Osteopenia   . Psoriasis   . Renal calculus, bilateral   . Scoliosis   . Wears contact lenses    right eye only    Past Surgical History:  Procedure Laterality Date  . ABDOMINAL HYSTERECTOMY  age 64  . BREAST LUMPECTOMY  1990's    bengin  . CARDIAC CATHETERIZATION  01-23-2010  dr Irish Lack   no hemodynamically significant CAD/  normal LVF  . CARDIOVASCULAR STRESS TEST  08-16-2008  dr Irish Lack   normal nuclear perfusion study/  no ischemia/  normal LV function and wall motion, ef 94%  . CATARACT EXTRACTION W/ INTRAOCULAR LENS  IMPLANT, BILATERAL  2001  . colonscopy    . CYSTOSCOPY W/ URETERAL STENT REMOVAL N/A 06/21/2015   Procedure: CYSTOSCOPY WITH STENT REMOVAL;  Surgeon: Franchot Gallo, MD;  [facility];  Service: Urology;  Laterality: N/A;  . CYSTOSCOPY WITH URETEROSCOPY AND  STENT PLACEMENT Bilateral 06/21/2015   Procedure: CYSTOSCOPY WITH URETEROSCOPY AND  STENT PLACEMENT;  Surgeon: Franchot Gallo, MD;  Location: [facility];  Service: Urology;  Laterality: Bilateral;  . EXTRACORPOREAL SHOCK WAVE LITHOTRIPSY  03-09-2014  . HOLMIUM LASER APPLICATION N/A 5/39/7673   Procedure: HOLMIUM LASER APPLICATION;  Surgeon: Franchot Gallo, MD;  Location: [facility];  Service: Urology;  Laterality: N/A;  . LIPOMA EXCISION  02-05-2011   Back  . LUMBAR LAMINECTOMY/DECOMPRESSION MICRODISCECTOMY  06/04/2011   Procedure: LUMBAR LAMINECTOMY/DECOMPRESSION MICRODISCECTOMY;  Surgeon: Floyce Stakes, MD;  Location: [place] [facility];  Service: Neurosurgery;  Laterality: N/A;  Lumbar Four-Five partial Lumbar Three Laminectomy  . PERCUTANEOUS NEPHROSTOLITHOTOMY Left 09-19-2009  . TOTAL KNEE ARTHROPLASTY Left 03/21/2014   Procedure: TOTAL KNEE ARTHROPLASTY;  Surgeon: Mauri Pole, MD;  Location: [facility];  Service: Orthopedics;  Laterality: Left;  left femoral nerve block  . TRANSTHORACIC ECHOCARDIOGRAM  12-28-2009   normal LVF, ef 60-65%/  mild AV sclerosis without stenosis/  mild AR, MR and TR/  trace PR    There were no vitals filed for this visit.      Subjective Assessment - 08/12/16 1352    Subjective "My low back hurts so much I cant tell my knee is bothering me" Pt reports that she had a low  back flair up Sunday afternoon   Currently in Pain? Yes   Pain Score --  Knee 5/10 back 10+/10                         [facility] Adult PT Treatment/Exercise - 08/12/16 0001      Modalities   Modalities Ultrasound     Moist Heat Therapy   Number Minutes Moist Heat 15 Minutes   Moist Heat Location Knee     Electrical Stimulation   Electrical Stimulation Location L knee   Electrical Stimulation Action IFC   Electrical Stimulation Parameters supine   Electrical Stimulation Goals Pain     Ultrasound   Ultrasound Location L lateral  knee/ant tib area   Ultrasound Parameters 3.3MHz 1.3w/cm2   Ultrasound Goals Pain     Manual Therapy   Manual Therapy Soft tissue mobilization   Soft tissue mobilization gentle STM to the left anterior tibialis origin area                  PT Short Term Goals - 08/05/16 1426      PT SHORT TERM GOAL #1   Title independent with initial HEP   Status Achieved           PT Long Term Goals - 07/28/16 1357      PT LONG TERM GOAL #1   Title decrease pain 25%   Status Achieved     PT LONG TERM GOAL #2   Title report walking 200 feet without rest and without pain >4/10   Status On-going     PT LONG TERM GOAL #3   Title decrease TUG time to 16 seconds   Status On-going               Plan - 08/12/16 1413    Clinical Impression Statement Pt reports that she is unable to do any exercises due to her recent low back flair up. Does reports some tenderness L anterior tibialis insertion   Rehab Potential Fair   PT Frequency 2x / week   PT Duration 8 weeks   PT Treatment/Interventions ADLs/Self Care Home Management;Cryotherapy;Electrical Stimulation;Iontophoresis 4mg /ml Dexamethasone;Moist Heat;Ultrasound;Gait training;Therapeutic activities;Therapeutic exercise;Manual techniques;Taping   PT Next Visit Plan continue pain relief; no more ionto patches      Patient will benefit from skilled therapeutic intervention in order to improve the following deficits and impairments:  Abnormal gait, Decreased activity tolerance, Decreased mobility, Decreased balance, Decreased endurance, Decreased strength, Difficulty walking, Impaired flexibility, Postural dysfunction, Improper body mechanics  Visit Diagnosis: Chronic pain of left knee  Difficulty in walking, not elsewhere classified     Problem List Patient Active Problem List   Diagnosis Date Noted  . Obese 03/22/2014  . S/P left TKA 03/21/2014  . S/P knee replacement 03/21/2014  . Depression   . GERD  (gastroesophageal reflux disease)   . Thyroid disease   . Hyperlipidemia   . Osteoarthritis   . Osteopenia   . Scoliosis   . Kidney stones   . Hypothyroidism   . Hypertension   . H/O hiatal hernia   . Psoriasis     Scot Jun, PTA 08/12/2016, 2:15 PM  [place], [place], [address] Phone: [phone]   Fax:  [phone]  Name: Becky Duran MRN: [id number] Date of Birth: Mar 21, 1933

## 2016-08-14 ENCOUNTER — Ambulatory Visit: Payer: Medicare Other | Admitting: Physical Therapy

## 2016-08-14 ENCOUNTER — Encounter: Payer: Self-pay | Admitting: Physical Therapy

## 2016-08-14 DIAGNOSIS — G8929 Other chronic pain: Secondary | ICD-10-CM | POA: Diagnosis not present

## 2016-08-14 DIAGNOSIS — R262 Difficulty in walking, not elsewhere classified: Secondary | ICD-10-CM

## 2016-08-14 DIAGNOSIS — M25562 Pain in left knee: Principal | ICD-10-CM

## 2016-08-14 DIAGNOSIS — M5442 Lumbago with sciatica, left side: Secondary | ICD-10-CM | POA: Diagnosis not present

## 2016-08-14 NOTE — Therapy (Signed)
[place], [place], [address] Phone: [phone]   Fax:  [phone]  Physical Therapy Treatment  Patient Details  Name: Becky Duran MRN: [id number] Date of Birth: 01/29/1933 Referring Provider: Gerrit Halls  Encounter Date: 08/14/2016      PT End of Session - 08/14/16 1518    Visit Number 8   Date for PT Re-Evaluation 09/14/16   PT Start Time 1450   PT Stop Time 1534   PT Time Calculation (min) 44 min   Activity Tolerance Patient tolerated treatment well   Behavior During Therapy [facility] for tasks assessed/performed      Past Medical History:  Diagnosis Date  . Chronic low back pain   . CKD (chronic kidney disease), stage II   . Depression   . GERD (gastroesophageal reflux disease)   . Heart murmur   . Hiatal hernia   . History of endometriosis   . History of kidney stones   . Hyperlipidemia   . Hypertension cardiologist-  dr Irish Lack   pt states intermittant bp spike at night cause headache  . Hypothyroidism   . Narcotic drug use   . Nocturia   . Osteoarthritis   . Osteopenia   . Psoriasis   . Renal calculus, bilateral   . Scoliosis   . Wears contact lenses    right eye only    Past Surgical History:  Procedure Laterality Date  . ABDOMINAL HYSTERECTOMY  age 76  . BREAST LUMPECTOMY  1990's    bengin  . CARDIAC CATHETERIZATION  01-23-2010  dr Irish Lack   no hemodynamically significant CAD/  normal LVF  . CARDIOVASCULAR STRESS TEST  08-16-2008  dr Irish Lack   normal nuclear perfusion study/  no ischemia/  normal LV function and wall motion, ef 94%  . CATARACT EXTRACTION W/ INTRAOCULAR LENS  IMPLANT, BILATERAL  2001  . colonscopy    . CYSTOSCOPY W/ URETERAL STENT REMOVAL N/A 06/21/2015   Procedure: CYSTOSCOPY WITH STENT REMOVAL;  Surgeon: Franchot Gallo, MD;  Location: [facility];  Service: Urology;  Laterality: N/A;  . CYSTOSCOPY WITH URETEROSCOPY AND  STENT PLACEMENT Bilateral 06/21/2015   Procedure: CYSTOSCOPY WITH URETEROSCOPY AND  STENT PLACEMENT;  Surgeon: Franchot Gallo, MD;  Location: [facility];  Service: Urology;  Laterality: Bilateral;  . EXTRACORPOREAL SHOCK WAVE LITHOTRIPSY  03-09-2014  . HOLMIUM LASER APPLICATION N/A 7/62/8315   Procedure: HOLMIUM LASER APPLICATION;  Surgeon: Franchot Gallo, MD;  Location: [facility];  Service: Urology;  Laterality: N/A;  . LIPOMA EXCISION  02-05-2011   Back  . LUMBAR LAMINECTOMY/DECOMPRESSION MICRODISCECTOMY  06/04/2011   Procedure: LUMBAR LAMINECTOMY/DECOMPRESSION MICRODISCECTOMY;  Surgeon: Floyce Stakes, MD;  Location: [place] [facility];  Service: Neurosurgery;  Laterality: N/A;  Lumbar Four-Five partial Lumbar Three Laminectomy  . PERCUTANEOUS NEPHROSTOLITHOTOMY Left 09-19-2009  . TOTAL KNEE ARTHROPLASTY Left 03/21/2014   Procedure: TOTAL KNEE ARTHROPLASTY;  Surgeon: Mauri Pole, MD;  Location: [facility];  Service: Orthopedics;  Laterality: Left;  left femoral nerve block  . TRANSTHORACIC ECHOCARDIOGRAM  12-28-2009   normal LVF, ef 60-65%/  mild AV sclerosis without stenosis/  mild AR, MR and TR/  trace PR    There were no vitals filed for this visit.      Subjective Assessment - 08/14/16 1517    Subjective Patient reports that her knee is feeling a lot better but is still c/o severe low back pain.  She will  be having an injection tomorrow.   Currently in Pain? Yes   Pain Score 9    Pain Location Back   Pain Orientation Left                         [facility] Adult PT Treatment/Exercise - 08/14/16 0001      Moist Heat Therapy   Number Minutes Moist Heat 15 Minutes   Moist Heat Location Lumbar Spine;Knee     Electrical Stimulation   Electrical Stimulation Location left knee and left lumbar area   Electrical Stimulation Action premod   Electrical Stimulation Parameters sitting   Electrical Stimulation Goals Pain     Ultrasound    Ultrasound Location left low back and left lateral knee   Ultrasound Parameters 1.5w/cm 1MHz   Ultrasound Goals Pain     Manual Therapy   Manual Therapy Soft tissue mobilization   Soft tissue mobilization gentle STM to the left anterior tibialis origin area                  PT Short Term Goals - 08/05/16 1426      PT SHORT TERM GOAL #1   Title independent with initial HEP   Status Achieved           PT Long Term Goals - 08/14/16 1519      PT LONG TERM GOAL #3   Title decrease TUG time to 16 seconds   Status Partially Met               Plan - 08/14/16 1519    Clinical Impression Statement Patient again with having low back pain predominately, reporting that she will have an injection in the back tomorrow.     PT Next Visit Plan continue pain relief   Consulted and Agree with Plan of Care Patient      Patient will benefit from skilled therapeutic intervention in order to improve the following deficits and impairments:  Abnormal gait, Decreased activity tolerance, Decreased mobility, Decreased balance, Decreased endurance, Decreased strength, Difficulty walking, Impaired flexibility, Postural dysfunction, Improper body mechanics  Visit Diagnosis: Chronic pain of left knee  Difficulty in walking, not elsewhere classified     Problem List Patient Active Problem List   Diagnosis Date Noted  . Obese 03/22/2014  . S/P left TKA 03/21/2014  . S/P knee replacement 03/21/2014  . Depression   . GERD (gastroesophageal reflux disease)   . Thyroid disease   . Hyperlipidemia   . Osteoarthritis   . Osteopenia   . Scoliosis   . Kidney stones   . Hypothyroidism   . Hypertension   . H/O hiatal hernia   . Psoriasis     Sumner Boast., PT 08/14/2016, 3:20 PM  [place], [place], [address] Phone: [phone]   Fax:  [phone]  Name: Becky Duran MRN:  [id number] Date of Birth: 08/26/1932

## 2016-08-15 DIAGNOSIS — M5137 Other intervertebral disc degeneration, lumbosacral region: Secondary | ICD-10-CM | POA: Diagnosis not present

## 2016-08-15 DIAGNOSIS — M961 Postlaminectomy syndrome, not elsewhere classified: Secondary | ICD-10-CM | POA: Diagnosis not present

## 2016-08-19 DIAGNOSIS — Z1211 Encounter for screening for malignant neoplasm of colon: Secondary | ICD-10-CM | POA: Diagnosis not present

## 2016-08-19 DIAGNOSIS — R300 Dysuria: Secondary | ICD-10-CM | POA: Diagnosis not present

## 2016-08-19 DIAGNOSIS — K219 Gastro-esophageal reflux disease without esophagitis: Secondary | ICD-10-CM | POA: Diagnosis not present

## 2016-08-19 DIAGNOSIS — M545 Low back pain: Secondary | ICD-10-CM | POA: Diagnosis not present

## 2016-08-19 DIAGNOSIS — I10 Essential (primary) hypertension: Secondary | ICD-10-CM | POA: Diagnosis not present

## 2016-08-19 DIAGNOSIS — E039 Hypothyroidism, unspecified: Secondary | ICD-10-CM | POA: Diagnosis not present

## 2016-08-19 DIAGNOSIS — N3000 Acute cystitis without hematuria: Secondary | ICD-10-CM | POA: Diagnosis not present

## 2016-08-19 DIAGNOSIS — F419 Anxiety disorder, unspecified: Secondary | ICD-10-CM | POA: Diagnosis not present

## 2016-08-19 DIAGNOSIS — E785 Hyperlipidemia, unspecified: Secondary | ICD-10-CM | POA: Diagnosis not present

## 2016-08-21 ENCOUNTER — Ambulatory Visit: Payer: Medicare Other | Admitting: Physical Therapy

## 2016-08-21 DIAGNOSIS — G8929 Other chronic pain: Secondary | ICD-10-CM

## 2016-08-21 DIAGNOSIS — R262 Difficulty in walking, not elsewhere classified: Secondary | ICD-10-CM

## 2016-08-21 DIAGNOSIS — M5442 Lumbago with sciatica, left side: Secondary | ICD-10-CM | POA: Diagnosis not present

## 2016-08-21 DIAGNOSIS — M25562 Pain in left knee: Secondary | ICD-10-CM | POA: Diagnosis not present

## 2016-08-21 NOTE — Therapy (Signed)
[place], [place], [address] Phone: [phone]   Fax:  [phone]  Physical Therapy Treatment  Patient Details  Name: Becky Duran MRN: [id number] Date of Birth: 1932-07-23 Referring Provider: Gerrit Halls  Encounter Date: 08/21/2016      PT End of Session - 08/21/16 1606    Visit Number 9   Date for PT Re-Evaluation 09/14/16   PT Start Time 1530   PT Stop Time 1613   PT Time Calculation (min) 43 min   Activity Tolerance Patient tolerated treatment well   Behavior During Therapy [facility] for tasks assessed/performed      Past Medical History:  Diagnosis Date  . Chronic low back pain   . CKD (chronic kidney disease), stage II   . Depression   . GERD (gastroesophageal reflux disease)   . Heart murmur   . Hiatal hernia   . History of endometriosis   . History of kidney stones   . Hyperlipidemia   . Hypertension cardiologist-  dr Irish Lack   pt states intermittant bp spike at night cause headache  . Hypothyroidism   . Narcotic drug use   . Nocturia   . Osteoarthritis   . Osteopenia   . Psoriasis   . Renal calculus, bilateral   . Scoliosis   . Wears contact lenses    right eye only    Past Surgical History:  Procedure Laterality Date  . ABDOMINAL HYSTERECTOMY  age 23  . BREAST LUMPECTOMY  1990's    bengin  . CARDIAC CATHETERIZATION  01-23-2010  dr Irish Lack   no hemodynamically significant CAD/  normal LVF  . CARDIOVASCULAR STRESS TEST  08-16-2008  dr Irish Lack   normal nuclear perfusion study/  no ischemia/  normal LV function and wall motion, ef 94%  . CATARACT EXTRACTION W/ INTRAOCULAR LENS  IMPLANT, BILATERAL  2001  . colonscopy    . CYSTOSCOPY W/ URETERAL STENT REMOVAL N/A 06/21/2015   Procedure: CYSTOSCOPY WITH STENT REMOVAL;  Surgeon: Franchot Gallo, MD;  Location[facility];  Service: Urology;  Laterality: N/A;  . CYSTOSCOPY WITH URETEROSCOPY AND  STENT PLACEMENT Bilateral 06/21/2015   Procedure: CYSTOSCOPY WITH URETEROSCOPY AND  STENT PLACEMENT;  Surgeon: Franchot Gallo, MD;  Location: [facility];  Service: Urology;  Laterality: Bilateral;  . EXTRACORPOREAL SHOCK WAVE LITHOTRIPSY  03-09-2014  . HOLMIUM LASER APPLICATION N/A 05/05/7508   Procedure: HOLMIUM LASER APPLICATION;  Surgeon: Franchot Gallo, MD;  Location: [facility];  Service: Urology;  Laterality: N/A;  . LIPOMA EXCISION  02-05-2011   Back  . LUMBAR LAMINECTOMY/DECOMPRESSION MICRODISCECTOMY  06/04/2011   Procedure: LUMBAR LAMINECTOMY/DECOMPRESSION MICRODISCECTOMY;  Surgeon: Floyce Stakes, MD;  Location: [place] [facility];  Service: Neurosurgery;  Laterality: N/A;  Lumbar Four-Five partial Lumbar Three Laminectomy  . PERCUTANEOUS NEPHROSTOLITHOTOMY Left 09-19-2009  . TOTAL KNEE ARTHROPLASTY Left 03/21/2014   Procedure: TOTAL KNEE ARTHROPLASTY;  Surgeon: Mauri Pole, MD;  Location: [facility];  Service: Orthopedics;  Laterality: Left;  left femoral nerve block  . TRANSTHORACIC ECHOCARDIOGRAM  12-28-2009   normal LVF, ef 60-65%/  mild AV sclerosis without stenosis/  mild AR, MR and TR/  trace PR    There were no vitals filed for this visit.      Subjective Assessment - 08/21/16 1536    Subjective Pt. reporting she feels ultrasound, E-stim, and massage has helped her the most.     Patient Stated Goals have  less pain   Currently in Pain? Yes   Pain Score 2    Pain Location Back   Multiple Pain Sites Yes   Pain Score 5   Pain Location Knee   Pain Orientation Left                         [facility] Adult PT Treatment/Exercise - 08/21/16 1603      Electrical Stimulation   Electrical Stimulation Location left knee and left lumbar area   Electrical Stimulation Action IFC   Electrical Stimulation Parameters Supine   3 pillows    Electrical Stimulation Goals Pain     Ultrasound   Ultrasound Location L lateral knee     Ultrasound Parameters 1.0MHz, 1.5w/cm2,    Ultrasound Goals Pain     Manual Therapy   Manual Therapy Soft tissue mobilization   Soft tissue mobilization gentle STM to the left anterior tibialis origin area, and lower back                   PT Short Term Goals - 08/05/16 1426      PT SHORT TERM GOAL #1   Title independent with initial HEP   Status Achieved           PT Long Term Goals - 08/14/16 1519      PT LONG TERM GOAL #3   Title decrease TUG time to 16 seconds   Status Partially Met               Plan - 08/21/16 1619    Clinical Impression Statement Pt. reporting she feels ultrasound, E-stim, and massage has helped her pain the most.  Today's treatment focusing on STM to and ultrasound to Tib. Anterior area with pt. feeling good relief with this with an overall decrease in L knee pain.  STM to L sided paraspinals today with good relief with pt. reporting overall decrease in back pain following.  Pt. reporting some benefit from recent injection and has noted increased relief over last few days.  Treatment ending with E-stim to L knee and low back to decrease pain and tone.     PT Treatment/Interventions ADLs/Self Care Home Management;Cryotherapy;Electrical Stimulation;Iontophoresis 79m/ml Dexamethasone;Moist Heat;Ultrasound;Gait training;Therapeutic activities;Therapeutic exercise;Manual techniques;Taping   PT Next Visit Plan 10th VISIT G-code; continue pain relief      Patient will benefit from skilled therapeutic intervention in order to improve the following deficits and impairments:  Abnormal gait, Decreased activity tolerance, Decreased mobility, Decreased balance, Decreased endurance, Decreased strength, Difficulty walking, Impaired flexibility, Postural dysfunction, Improper body mechanics  Visit Diagnosis: Chronic pain of left knee  Difficulty in walking, not elsewhere classified     Problem List Patient Active Problem List   Diagnosis Date  Noted  . Obese 03/22/2014  . S/P left TKA 03/21/2014  . S/P knee replacement 03/21/2014  . Depression   . GERD (gastroesophageal reflux disease)   . Thyroid disease   . Hyperlipidemia   . Osteoarthritis   . Osteopenia   . Scoliosis   . Kidney stones   . Hypothyroidism   . Hypertension   . H/O hiatal hernia   . Psoriasis     MBess Harvest PDelaware04/19/18 6:09 PM  [place] [address]Phone: [phone]  Fax:  [phone] Name: Becky HRIBARMRN: [id number] of Birth: 605-17-1934

## 2016-08-25 DIAGNOSIS — C44519 Basal cell carcinoma of skin of other part of trunk: Secondary | ICD-10-CM | POA: Diagnosis not present

## 2016-08-27 ENCOUNTER — Ambulatory Visit: Payer: Medicare Other | Admitting: Physical Therapy

## 2016-08-27 ENCOUNTER — Encounter: Payer: Self-pay | Admitting: Physical Therapy

## 2016-08-27 DIAGNOSIS — G8929 Other chronic pain: Secondary | ICD-10-CM

## 2016-08-27 DIAGNOSIS — R262 Difficulty in walking, not elsewhere classified: Secondary | ICD-10-CM | POA: Diagnosis not present

## 2016-08-27 DIAGNOSIS — M5442 Lumbago with sciatica, left side: Secondary | ICD-10-CM | POA: Diagnosis not present

## 2016-08-27 DIAGNOSIS — M25562 Pain in left knee: Secondary | ICD-10-CM | POA: Diagnosis not present

## 2016-08-27 NOTE — Therapy (Signed)
[place], [place], [address] Phone: [phone]   Fax:  [phone]  Physical Therapy Treatment  Patient Details  Name: Becky Duran MRN: [id number] Date of Birth: 02/10/1933 Referring Provider: Gerrit Halls  Encounter Date: 08/27/2016      PT End of Session - 08/27/16 1550    Activity Tolerance Patient tolerated treatment well   Behavior During Therapy [facility] for tasks assessed/performed      Past Medical History:  Diagnosis Date  . Chronic low back pain   . CKD (chronic kidney disease), stage II   . Depression   . GERD (gastroesophageal reflux disease)   . Heart murmur   . Hiatal hernia   . History of endometriosis   . History of kidney stones   . Hyperlipidemia   . Hypertension cardiologist-  dr Irish Lack   pt states intermittant bp spike at night cause headache  . Hypothyroidism   . Narcotic drug use   . Nocturia   . Osteoarthritis   . Osteopenia   . Psoriasis   . Renal calculus, bilateral   . Scoliosis   . Wears contact lenses    right eye only    Past Surgical History:  Procedure Laterality Date  . ABDOMINAL HYSTERECTOMY  age 39  . BREAST LUMPECTOMY  1990's    bengin  . CARDIAC CATHETERIZATION  01-23-2010  dr Irish Lack   no hemodynamically significant CAD/  normal LVF  . CARDIOVASCULAR STRESS TEST  08-16-2008  dr Irish Lack   normal nuclear perfusion study/  no ischemia/  normal LV function and wall motion, ef 94%  . CATARACT EXTRACTION W/ INTRAOCULAR LENS  IMPLANT, BILATERAL  2001  . colonscopy    . CYSTOSCOPY W/ URETERAL STENT REMOVAL N/A 06/21/2015   Procedure: CYSTOSCOPY WITH STENT REMOVAL;  Surgeon: Franchot Gallo, MD;  Location: [facility];  Service: Urology;  Laterality: N/A;  . CYSTOSCOPY WITH URETEROSCOPY AND STENT PLACEMENT Bilateral 06/21/2015   Procedure: CYSTOSCOPY WITH URETEROSCOPY AND  STENT PLACEMENT;  Surgeon: Franchot Gallo, MD;   [facility];  Service: Urology;  Laterality: Bilateral;  . EXTRACORPOREAL SHOCK WAVE LITHOTRIPSY  03-09-2014  . HOLMIUM LASER APPLICATION N/A 6/73/4193   Procedure: HOLMIUM LASER APPLICATION;  Surgeon: Franchot Gallo, MD;  Location: [facility];  Service: Urology;  Laterality: N/A;  . LIPOMA EXCISION  02-05-2011   Back  . LUMBAR LAMINECTOMY/DECOMPRESSION MICRODISCECTOMY  06/04/2011   Procedure: LUMBAR LAMINECTOMY/DECOMPRESSION MICRODISCECTOMY;  Surgeon: Floyce Stakes, MD;  Location: [facility];  Service: Neurosurgery;  Laterality: N/A;  Lumbar Four-Five partial Lumbar Three Laminectomy  . PERCUTANEOUS NEPHROSTOLITHOTOMY Left 09-19-2009  . TOTAL KNEE ARTHROPLASTY Left 03/21/2014   Procedure: TOTAL KNEE ARTHROPLASTY;  Surgeon: Mauri Pole, MD;  Location: [facility];  Service: Orthopedics;  Laterality: Left;  left femoral nerve block  . TRANSTHORACIC ECHOCARDIOGRAM  12-28-2009   normal LVF, ef 60-65%/  mild AV sclerosis without stenosis/  mild AR, MR and TR/  trace PR    There were no vitals filed for this visit.      Subjective Assessment - 08/27/16 1438    Subjective Pt reports that she has pain in her left leg rated at a 5. Also pain in back rated at a 6. Says that she has recieved a shot in her back and she feels like it is doing alot better.  [facility] Adult PT Treatment/Exercise - 23-Sep-2016 0001      Electrical Stimulation   Electrical Stimulation Location left knee and left lumbar area   Electrical Stimulation Parameters Supine   Electrical Stimulation Goals Pain     Ultrasound   Ultrasound Location L Lateral knee   Ultrasound Parameters 1.0MHz, 1.3 w/cm2   Ultrasound Goals Pain     Manual Therapy   Manual Therapy Soft tissue mobilization  PROM held at end range of dorsiflexors   Soft tissue mobilization gentle STM to the left anterior tibialis origin area,   Passive ROM some PROM held at end  range of Dorsiflexors                  PT Short Term Goals - 08/05/16 1426      PT SHORT TERM GOAL #1   Title independent with initial HEP   Status Achieved           PT Long Term Goals - 08/14/16 1519      PT LONG TERM GOAL #3   Title decrease TUG time to 16 seconds   Status Partially Met               Plan - Sep 23, 2016 1544    Clinical Impression Statement Pt reports that her back was feeling better after the getting the shot. STM, [place] to L tibialis anterior. Pt reported decreased pain during STM and also reported that the PROM held at end range felt good. Declined STM to L side of back today because of relief from recent shot. E-Stim to lateral aspect of knee for pain and Low back to help with slight pain and tightness   Rehab Potential Fair   PT Treatment/Interventions ADLs/Self Care Home Management;Cryotherapy;Electrical Stimulation;Iontophoresis 15m/ml Dexamethasone;Moist Heat;Ultrasound;Gait training;Therapeutic activities;Therapeutic exercise;Manual techniques;Taping   PT Next Visit Plan 10th VISIT G-code; continue pain relief      Patient will benefit from skilled therapeutic intervention in order to improve the following deficits and impairments:  Abnormal gait, Decreased activity tolerance, Decreased mobility, Decreased balance, Decreased endurance, Decreased strength, Difficulty walking, Impaired flexibility, Postural dysfunction, Improper body mechanics  Visit Diagnosis: No diagnosis found.       G-Codes - [id number]    Functional Assessment Tool Used (Outpatient Only) foto 66% limitation   Functional Limitation Mobility: Walking and moving around   Mobility: Walking and Moving Around Current Status [phone] At least 60 percent but less than 80 percent impaired, limited or restricted   Mobility: Walking and Moving Around Goal Status [phone] At least 40 percent but less than 60 percent impaired, limited or restricted      Problem  List Patient Active Problem List   Diagnosis Date Noted  . Obese 03/22/2014  . S/P left TKA 03/21/2014  . S/P knee replacement 03/21/2014  . Depression   . GERD (gastroesophageal reflux disease)   . Thyroid disease   . Hyperlipidemia   . Osteoarthritis   . Osteopenia   . Scoliosis   . Kidney stones   . Hypothyroidism   . Hypertension   . H/O hiatal hernia   . Psoriasis     Labaron Digirolamo,SPTA 405-22-18 4:30 PM  [place] [address]Phone: [phone]  Fax:  [phone] Name: BSOCORRO KANITZMRN: [id number] of Birth: 604-08-1932

## 2016-08-27 NOTE — Therapy (Deleted)
[place], [place], [address] Phone: [phone]   Fax:  [phone]  Physical Therapy Treatment  Patient Details  Name: Becky Duran MRN: [id number] Date of Birth: 02/10/1933 Referring Provider: Gerrit Halls  Encounter Date: 08/27/2016      PT End of Session - 08/27/16 1550    Activity Tolerance Patient tolerated treatment well   Behavior During Therapy [facility] for tasks assessed/performed      Past Medical History:  Diagnosis Date  . Chronic low back pain   . CKD (chronic kidney disease), stage II   . Depression   . GERD (gastroesophageal reflux disease)   . Heart murmur   . Hiatal hernia   . History of endometriosis   . History of kidney stones   . Hyperlipidemia   . Hypertension cardiologist-  dr Irish Lack   pt states intermittant bp spike at night cause headache  . Hypothyroidism   . Narcotic drug use   . Nocturia   . Osteoarthritis   . Osteopenia   . Psoriasis   . Renal calculus, bilateral   . Scoliosis   . Wears contact lenses    right eye only    Past Surgical History:  Procedure Laterality Date  . ABDOMINAL HYSTERECTOMY  age 39  . BREAST LUMPECTOMY  1990's    bengin  . CARDIAC CATHETERIZATION  01-23-2010  dr Irish Lack   no hemodynamically significant CAD/  normal LVF  . CARDIOVASCULAR STRESS TEST  08-16-2008  dr Irish Lack   normal nuclear perfusion study/  no ischemia/  normal LV function and wall motion, ef 94%  . CATARACT EXTRACTION W/ INTRAOCULAR LENS  IMPLANT, BILATERAL  2001  . colonscopy    . CYSTOSCOPY W/ URETERAL STENT REMOVAL N/A 06/21/2015   Procedure: CYSTOSCOPY WITH STENT REMOVAL;  Surgeon: Franchot Gallo, MD;  Location: [facility];  Service: Urology;  Laterality: N/A;  . CYSTOSCOPY WITH URETEROSCOPY AND STENT PLACEMENT Bilateral 06/21/2015   Procedure: CYSTOSCOPY WITH URETEROSCOPY AND  STENT PLACEMENT;  Surgeon: Franchot Gallo, MD;   [facility];  Service: Urology;  Laterality: Bilateral;  . EXTRACORPOREAL SHOCK WAVE LITHOTRIPSY  03-09-2014  . HOLMIUM LASER APPLICATION N/A 6/73/4193   Procedure: HOLMIUM LASER APPLICATION;  Surgeon: Franchot Gallo, MD;  Location: [facility];  Service: Urology;  Laterality: N/A;  . LIPOMA EXCISION  02-05-2011   Back  . LUMBAR LAMINECTOMY/DECOMPRESSION MICRODISCECTOMY  06/04/2011   Procedure: LUMBAR LAMINECTOMY/DECOMPRESSION MICRODISCECTOMY;  Surgeon: Floyce Stakes, MD;  Location: [facility];  Service: Neurosurgery;  Laterality: N/A;  Lumbar Four-Five partial Lumbar Three Laminectomy  . PERCUTANEOUS NEPHROSTOLITHOTOMY Left 09-19-2009  . TOTAL KNEE ARTHROPLASTY Left 03/21/2014   Procedure: TOTAL KNEE ARTHROPLASTY;  Surgeon: Mauri Pole, MD;  Location: [facility];  Service: Orthopedics;  Laterality: Left;  left femoral nerve block  . TRANSTHORACIC ECHOCARDIOGRAM  12-28-2009   normal LVF, ef 60-65%/  mild AV sclerosis without stenosis/  mild AR, MR and TR/  trace PR    There were no vitals filed for this visit.      Subjective Assessment - 08/27/16 1438    Subjective Pt reports that she has pain in her left leg rated at a 5. Also pain in back rated at a 6. Says that she has recieved a shot in her back and she feels like it is doing alot better.  [facility] Adult PT Treatment/Exercise - 08/27/16 0001      Electrical Stimulation   Electrical Stimulation Location left knee and left lumbar area   Electrical Stimulation Parameters Supine   Electrical Stimulation Goals Pain     Ultrasound   Ultrasound Location L Lateral knee   Ultrasound Parameters 1.0MHz, 1.3 w/cm2   Ultrasound Goals Pain     Manual Therapy   Manual Therapy Soft tissue mobilization  PROM held at end range of dorsiflexors   Soft tissue mobilization gentle STM to the left anterior tibialis origin area,   Passive ROM some PROM held at end  range of Dorsiflexors                  PT Short Term Goals - 08/05/16 1426      PT SHORT TERM GOAL #1   Title independent with initial HEP   Status Achieved           PT Long Term Goals - 08/14/16 1519      PT LONG TERM GOAL #3   Title decrease TUG time to 16 seconds   Status Partially Met               Plan - 08/27/16 1544    Clinical Impression Statement Pt reports that her back was feeling better after the getting injection in lowbackt. STM, [place] to L tibialis anterior. Pt reported decreased pain during STM and also reported that the PROM held at end range felt good. Declined STM to L side of back today because of relief from recent injection. E-Stim to lateral aspect of knee for pain and Low back to help with slight pain and muscle tightness   Rehab Potential Fair   PT Treatment/Interventions ADLs/Self Care Home Management;Cryotherapy;Electrical Stimulation;Iontophoresis '4mg'$ /ml Dexamethasone;Moist Heat;Ultrasound;Gait training;Therapeutic activities;Therapeutic exercise;Manual techniques;Taping   PT Next Visit Plan 10th VISIT G-code; continue pain relief      Patient will benefit from skilled therapeutic intervention in order to improve the following deficits and impairments:  Abnormal gait, Decreased activity tolerance, Decreased mobility, Decreased balance, Decreased endurance, Decreased strength, Difficulty walking, Impaired flexibility, Postural dysfunction, Improper body mechanics  Visit Diagnosis: No diagnosis found.     Problem List Patient Active Problem List   Diagnosis Date Noted  . Obese 03/22/2014  . S/P left TKA 03/21/2014  . S/P knee replacement 03/21/2014  . Depression   . GERD (gastroesophageal reflux disease)   . Thyroid disease   . Hyperlipidemia   . Osteoarthritis   . Osteopenia   . Scoliosis   . Kidney stones   . Hypothyroidism   . Hypertension   . H/O hiatal hernia   . Psoriasis     Homer Pfeifer,SPTA 08/27/2016, 3:50  PM  [place], [place], [address] Phone: [phone]   Fax:  [phone]  Name: Becky Duran MRN: [id number] Date of Birth: 11-23-1932

## 2016-08-29 DIAGNOSIS — Z1211 Encounter for screening for malignant neoplasm of colon: Secondary | ICD-10-CM | POA: Diagnosis not present

## 2016-09-02 ENCOUNTER — Encounter: Payer: Self-pay | Admitting: Physical Therapy

## 2016-09-02 ENCOUNTER — Ambulatory Visit: Payer: Medicare Other | Attending: Orthopedic Surgery | Admitting: Physical Therapy

## 2016-09-02 DIAGNOSIS — R262 Difficulty in walking, not elsewhere classified: Secondary | ICD-10-CM | POA: Insufficient documentation

## 2016-09-02 DIAGNOSIS — M25562 Pain in left knee: Secondary | ICD-10-CM | POA: Insufficient documentation

## 2016-09-02 DIAGNOSIS — G8929 Other chronic pain: Secondary | ICD-10-CM

## 2016-09-02 DIAGNOSIS — M5442 Lumbago with sciatica, left side: Secondary | ICD-10-CM | POA: Diagnosis not present

## 2016-09-02 NOTE — Therapy (Signed)
[place], [place], [address] Phone: [phone]   Fax:  [phone]  Physical Therapy Treatment  Patient Details  Name: Becky Duran MRN: [id number] Date of Birth: [birth year] Referring Provider: Gerrit Halls  Encounter Date: 09/02/2016      PT End of Session - 09/02/16 1327    Visit Number 11   Date for PT Re-Evaluation 09/14/16   PT Start Time 1300   PT Stop Time 1340   PT Time Calculation (min) 40 min   Activity Tolerance Patient tolerated treatment well   Behavior During Therapy [facility] for tasks assessed/performed      Past Medical History:  Diagnosis Date  . Chronic low back pain   . CKD (chronic kidney disease), stage II   . Depression   . GERD (gastroesophageal reflux disease)   . Heart murmur   . Hiatal hernia   . History of endometriosis   . History of kidney stones   . Hyperlipidemia   . Hypertension cardiologist-  dr Irish Lack   pt states intermittant bp spike at night cause headache  . Hypothyroidism   . Narcotic drug use   . Nocturia   . Osteoarthritis   . Osteopenia   . Psoriasis   . Renal calculus, bilateral   . Scoliosis   . Wears contact lenses    right eye only    Past Surgical History:  Procedure Laterality Date  . ABDOMINAL HYSTERECTOMY  age 8  . BREAST LUMPECTOMY  1990's    bengin  . CARDIAC CATHETERIZATION  01-23-2010  dr Irish Lack   no hemodynamically significant CAD/  normal LVF  . CARDIOVASCULAR STRESS TEST  08-16-2008  dr Irish Lack   normal nuclear perfusion study/  no ischemia/  normal LV function and wall motion, ef 94%  . CATARACT EXTRACTION W/ INTRAOCULAR LENS  IMPLANT, BILATERAL  2001  . colonscopy    . CYSTOSCOPY W/ URETERAL STENT REMOVAL N/A 06/21/2015   Procedure: CYSTOSCOPY WITH STENT REMOVAL;  Surgeon: Franchot Gallo, MD;  Location: [facility];  Service: Urology;  Laterality: N/A;  . CYSTOSCOPY WITH URETEROSCOPY AND  STENT PLACEMENT Bilateral 06/21/2015   Procedure: CYSTOSCOPY WITH URETEROSCOPY AND  STENT PLACEMENT;  Surgeon: Franchot Gallo, MD;  Location: [facility];  Service: Urology;  Laterality: Bilateral;  . EXTRACORPOREAL SHOCK WAVE LITHOTRIPSY  03-09-2014  . HOLMIUM LASER APPLICATION N/A 1/51/7616   Procedure: HOLMIUM LASER APPLICATION;  Surgeon: Franchot Gallo, MD;  Location: [facility];  Service: Urology;  Laterality: N/A;  . LIPOMA EXCISION  02-05-2011   Back  . LUMBAR LAMINECTOMY/DECOMPRESSION MICRODISCECTOMY  06/04/2011   Procedure: LUMBAR LAMINECTOMY/DECOMPRESSION MICRODISCECTOMY;  Surgeon: Floyce Stakes, MD;  Location: [facility];  Service: Neurosurgery;  Laterality: N/A;  Lumbar Four-Five partial Lumbar Three Laminectomy  . PERCUTANEOUS NEPHROSTOLITHOTOMY Left 09-19-2009  . TOTAL KNEE ARTHROPLASTY Left 03/21/2014   Procedure: TOTAL KNEE ARTHROPLASTY;  Surgeon: Mauri Pole, MD;  Location: [facility];  Service: Orthopedics;  Laterality: Left;  left femoral nerve block  . TRANSTHORACIC ECHOCARDIOGRAM  12-28-2009   normal LVF, ef 60-65%/  mild AV sclerosis without stenosis/  mild AR, MR and TR/  trace PR    There were no vitals filed for this visit.      Subjective Assessment - 09/02/16 1302    Subjective Pt reports that her knee is better but not as good as she would like it to be   Currently  in Pain? Yes   Pain Score 3    Pain Location Knee   Pain Orientation Left                         [facility] Adult PT Treatment/Exercise - 09/02/16 0001      Moist Heat Therapy   Number Minutes Moist Heat 15 Minutes   Moist Heat Location Lumbar Spine;Knee     Electrical Stimulation   Electrical Stimulation Location left knee and left lumbar area   Electrical Stimulation Parameters supine   Electrical Stimulation Goals Pain     Ultrasound   Ultrasound Location L lateral Knee   Ultrasound Parameters 1.0MHz, 1.3w/cm2   Ultrasound Goals  Pain                  PT Short Term Goals - 08/05/16 1426      PT SHORT TERM GOAL #1   Title independent with initial HEP   Status Achieved           PT Long Term Goals - 08/14/16 1519      PT LONG TERM GOAL #3   Title decrease TUG time to 16 seconds   Status Partially Met               Plan - 09/02/16 1330    Clinical Impression Statement Pt reports that her knee has improved 50% since starting therapy. Reports [place] and e-stim gives her the best relief.   Rehab Potential Fair   PT Frequency 2x / week   PT Duration 8 weeks   PT Treatment/Interventions ADLs/Self Care Home Management;Cryotherapy;Electrical Stimulation;Iontophoresis 53m/ml Dexamethasone;Moist Heat;Ultrasound;Gait training;Therapeutic activities;Therapeutic exercise;Manual techniques;Taping   PT Next Visit Plan continue pain relief      Patient will benefit from skilled therapeutic intervention in order to improve the following deficits and impairments:  Abnormal gait, Decreased activity tolerance, Decreased mobility, Decreased balance, Decreased endurance, Decreased strength, Difficulty walking, Impaired flexibility, Postural dysfunction, Improper body mechanics  Visit Diagnosis: Chronic midline low back pain with left-sided sciatica  Chronic pain of left knee  Difficulty in walking, not elsewhere classified     Problem List Patient Active Problem List   Diagnosis Date Noted  . Obese 03/22/2014  . S/P left TKA 03/21/2014  . S/P knee replacement 03/21/2014  . Depression   . GERD (gastroesophageal reflux disease)   . Thyroid disease   . Hyperlipidemia   . Osteoarthritis   . Osteopenia   . Scoliosis   . Kidney stones   . Hypothyroidism   . Hypertension   . H/O hiatal hernia   . Psoriasis     RScot Jun PTA 09/02/2016, 1:31 PM  [place] [address]Phone: [phone]  Fax:   [phone] Name: Becky TINOMRN: [id number] of Birth: 622-Feb-1934

## 2016-09-04 ENCOUNTER — Ambulatory Visit: Payer: Medicare Other | Admitting: Physical Therapy

## 2016-09-04 ENCOUNTER — Encounter: Payer: Self-pay | Admitting: Physical Therapy

## 2016-09-04 DIAGNOSIS — R262 Difficulty in walking, not elsewhere classified: Secondary | ICD-10-CM | POA: Diagnosis not present

## 2016-09-04 DIAGNOSIS — M25562 Pain in left knee: Secondary | ICD-10-CM | POA: Diagnosis not present

## 2016-09-04 DIAGNOSIS — M5442 Lumbago with sciatica, left side: Secondary | ICD-10-CM | POA: Diagnosis not present

## 2016-09-04 DIAGNOSIS — G8929 Other chronic pain: Secondary | ICD-10-CM | POA: Diagnosis not present

## 2016-09-04 NOTE — Therapy (Signed)
[place], [place], [address] Phone: [phone]   Fax:  [phone]  Physical Therapy Treatment  Patient Details  Name: Becky Duran MRN: [id number] Date of Birth: [birth year] Referring Provider: Gerrit Halls  Encounter Date: 09/04/2016      PT End of Session - 09/04/16 1429    Visit Number 12   Date for PT Re-Evaluation 09/14/16   PT Start Time 6812   PT Stop Time 1445   PT Time Calculation (min) 49 min   Activity Tolerance Patient tolerated treatment well   Behavior During Therapy [facility] for tasks assessed/performed      Past Medical History:  Diagnosis Date  . Chronic low back pain   . CKD (chronic kidney disease), stage II   . Depression   . GERD (gastroesophageal reflux disease)   . Heart murmur   . Hiatal hernia   . History of endometriosis   . History of kidney stones   . Hyperlipidemia   . Hypertension cardiologist-  dr Irish Lack   pt states intermittant bp spike at night cause headache  . Hypothyroidism   . Narcotic drug use   . Nocturia   . Osteoarthritis   . Osteopenia   . Psoriasis   . Renal calculus, bilateral   . Scoliosis   . Wears contact lenses    right eye only    Past Surgical History:  Procedure Laterality Date  . ABDOMINAL HYSTERECTOMY  age 37  . BREAST LUMPECTOMY  1990's    bengin  . CARDIAC CATHETERIZATION  01-23-2010  dr Irish Lack   no hemodynamically significant CAD/  normal LVF  . CARDIOVASCULAR STRESS TEST  08-16-2008  dr Irish Lack   normal nuclear perfusion study/  no ischemia/  normal LV function and wall motion, ef 94%  . CATARACT EXTRACTION W/ INTRAOCULAR LENS  IMPLANT, BILATERAL  2001  . colonscopy    . CYSTOSCOPY W/ URETERAL STENT REMOVAL N/A 06/21/2015   Procedure: CYSTOSCOPY WITH STENT REMOVAL;  Surgeon: Franchot Gallo, MD;  Location: [facility];  Service: Urology;  Laterality: N/A;  . CYSTOSCOPY WITH URETEROSCOPY AND  STENT PLACEMENT Bilateral 06/21/2015   Procedure: CYSTOSCOPY WITH URETEROSCOPY AND  STENT PLACEMENT;  Surgeon: Franchot Gallo, MD;  Location: [facility];  Service: Urology;  Laterality: Bilateral;  . EXTRACORPOREAL SHOCK WAVE LITHOTRIPSY  03-09-2014  . HOLMIUM LASER APPLICATION N/A 7/51/7001   Procedure: HOLMIUM LASER APPLICATION;  Surgeon: Franchot Gallo, MD;  Location: [facility];  Service: Urology;  Laterality: N/A;  . LIPOMA EXCISION  02-05-2011   Back  . LUMBAR LAMINECTOMY/DECOMPRESSION MICRODISCECTOMY  06/04/2011   Procedure: LUMBAR LAMINECTOMY/DECOMPRESSION MICRODISCECTOMY;  Surgeon: Floyce Stakes, MD;  Location: [facility];  Service: Neurosurgery;  Laterality: N/A;  Lumbar Four-Five partial Lumbar Three Laminectomy  . PERCUTANEOUS NEPHROSTOLITHOTOMY Left 09-19-2009  . TOTAL KNEE ARTHROPLASTY Left 03/21/2014   Procedure: TOTAL KNEE ARTHROPLASTY;  Surgeon: Mauri Pole, MD;  Location: [facility];  Service: Orthopedics;  Laterality: Left;  left femoral nerve block  . TRANSTHORACIC ECHOCARDIOGRAM  12-28-2009   normal LVF, ef 60-65%/  mild AV sclerosis without stenosis/  mild AR, MR and TR/  trace PR    There were no vitals filed for this visit.      Subjective Assessment - 09/04/16 1425    Subjective I really think I am better   Currently in Pain? Yes   Pain Score 1    Pain  Location Knee   Pain Orientation Left;Anterior;Lateral;Lower;Distal                         [facility] Adult PT Treatment/Exercise - 09/04/16 0001      Knee/Hip Exercises: Stretches   Passive Hamstring Stretch 5 reps;Left;10 seconds   Gastroc Stretch 4 reps;30 seconds   Soleus Stretch 4 reps;30 seconds     Electrical Stimulation   Electrical Stimulation Location left knee    Electrical Stimulation Action IFC   Electrical Stimulation Parameters supine   Electrical Stimulation Goals Pain     Ultrasound   Ultrasound Location left anterior lateral knee    Ultrasound Parameters 1MHz 1.3w/cm2   Ultrasound Goals Pain     Manual Therapy   Soft tissue mobilization gentle STM to the left anterior tibialis origin area,   Passive ROM some PROM held at end range of Dorsiflexors                  PT Short Term Goals - 08/05/16 1426      PT SHORT TERM GOAL #1   Title independent with initial HEP   Status Achieved           PT Long Term Goals - 09/04/16 1433      PT LONG TERM GOAL #1   Title decrease pain 25%   Status Achieved     PT LONG TERM GOAL #2   Title report walking 200 feet without rest and without pain >4/10   Status Achieved     PT LONG TERM GOAL #3   Title decrease TUG time to 16 seconds   Status Achieved               Plan - 09/04/16 1431    Clinical Impression Statement Reports overall doing better and feels like she can stop.  She reports happy with the less pain and that she has been able to do a little yardwork yesterday without increase of pain   PT Next Visit Plan will place on hold as she is feeling good, if she does not call in two weeks we will d/c with goals met   Consulted and Agree with Plan of Care Patient      Patient will benefit from skilled therapeutic intervention in order to improve the following deficits and impairments:  Abnormal gait, Decreased activity tolerance, Decreased mobility, Decreased balance, Decreased endurance, Decreased strength, Difficulty walking, Impaired flexibility, Postural dysfunction, Improper body mechanics  Visit Diagnosis: Chronic midline low back pain with left-sided sciatica  Chronic pain of left knee  Difficulty in walking, not elsewhere classified     Problem List Patient Active Problem List   Diagnosis Date Noted  . Obese 03/22/2014  . S/P left TKA 03/21/2014  . S/P knee replacement 03/21/2014  . Depression   . GERD (gastroesophageal reflux disease)   . Thyroid disease   . Hyperlipidemia   . Osteoarthritis   . Osteopenia   . Scoliosis    . Kidney stones   . Hypothyroidism   . Hypertension   . H/O hiatal hernia   . Psoriasis     Sumner Boast., PT 09/04/2016, 2:34 PM  [place], [place], [address] Phone: [phone]   Fax:  [phone]  Name: Becky Duran MRN: [id number] Date of Birth: 03-29-1933

## 2016-10-06 DIAGNOSIS — Z85828 Personal history of other malignant neoplasm of skin: Secondary | ICD-10-CM | POA: Diagnosis not present

## 2016-10-06 DIAGNOSIS — L905 Scar conditions and fibrosis of skin: Secondary | ICD-10-CM | POA: Diagnosis not present

## 2016-10-17 DIAGNOSIS — M5412 Radiculopathy, cervical region: Secondary | ICD-10-CM | POA: Diagnosis not present

## 2016-10-17 DIAGNOSIS — M542 Cervicalgia: Secondary | ICD-10-CM | POA: Diagnosis not present

## 2016-10-17 DIAGNOSIS — M5010 Cervical disc disorder with radiculopathy, unspecified cervical region: Secondary | ICD-10-CM | POA: Diagnosis not present

## 2016-10-17 DIAGNOSIS — M5033 Other cervical disc degeneration, cervicothoracic region: Secondary | ICD-10-CM | POA: Diagnosis not present

## 2016-11-10 DIAGNOSIS — H43813 Vitreous degeneration, bilateral: Secondary | ICD-10-CM | POA: Diagnosis not present

## 2016-11-10 DIAGNOSIS — Z961 Presence of intraocular lens: Secondary | ICD-10-CM | POA: Diagnosis not present

## 2016-11-10 DIAGNOSIS — H04123 Dry eye syndrome of bilateral lacrimal glands: Secondary | ICD-10-CM | POA: Diagnosis not present

## 2016-11-10 DIAGNOSIS — H524 Presbyopia: Secondary | ICD-10-CM | POA: Diagnosis not present

## 2016-11-10 DIAGNOSIS — H353131 Nonexudative age-related macular degeneration, bilateral, early dry stage: Secondary | ICD-10-CM | POA: Diagnosis not present

## 2016-11-10 DIAGNOSIS — H52223 Regular astigmatism, bilateral: Secondary | ICD-10-CM | POA: Diagnosis not present

## 2016-11-10 DIAGNOSIS — H5203 Hypermetropia, bilateral: Secondary | ICD-10-CM | POA: Diagnosis not present

## 2017-01-09 DIAGNOSIS — M5137 Other intervertebral disc degeneration, lumbosacral region: Secondary | ICD-10-CM | POA: Diagnosis not present

## 2017-01-09 DIAGNOSIS — M961 Postlaminectomy syndrome, not elsewhere classified: Secondary | ICD-10-CM | POA: Diagnosis not present

## 2017-01-09 DIAGNOSIS — M545 Low back pain: Secondary | ICD-10-CM | POA: Diagnosis not present

## 2017-01-09 DIAGNOSIS — M5136 Other intervertebral disc degeneration, lumbar region: Secondary | ICD-10-CM | POA: Diagnosis not present

## 2017-01-21 DIAGNOSIS — Z1231 Encounter for screening mammogram for malignant neoplasm of breast: Secondary | ICD-10-CM | POA: Diagnosis not present

## 2017-01-25 IMAGING — XA IR KYPHO VERTEBRAL LUMBAR AUGMENTATION
1 series · 13 of 24 positions shown · IV contrast (IODINE)
Comparison: none

CLINICAL DATA: Severe low back pain secondary to compression
fracture at L4.

[Series 300: ir kypho vertebral lumbar augmentation · 13 of 45 slices shown]
[im 1/45]
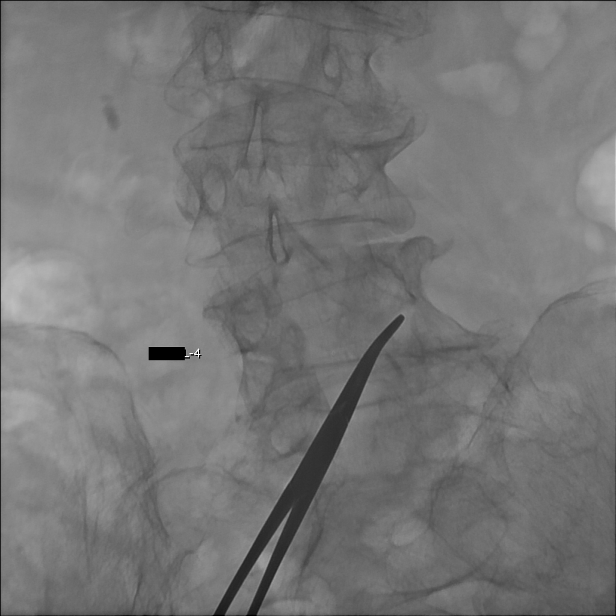
[im 4/45]
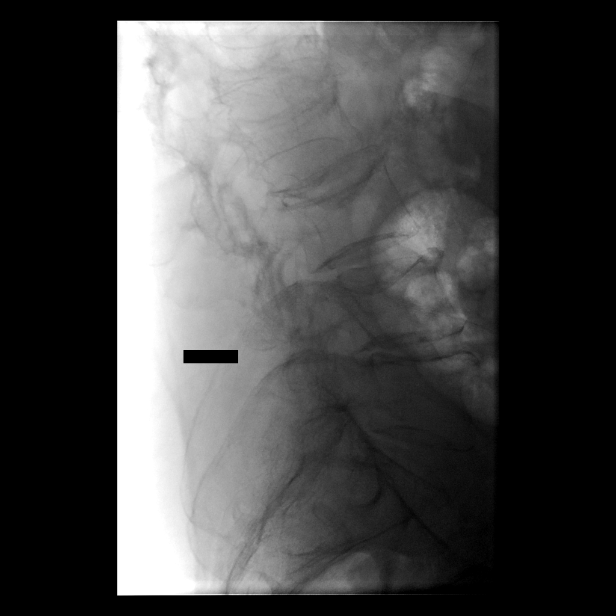
[im 8/45]
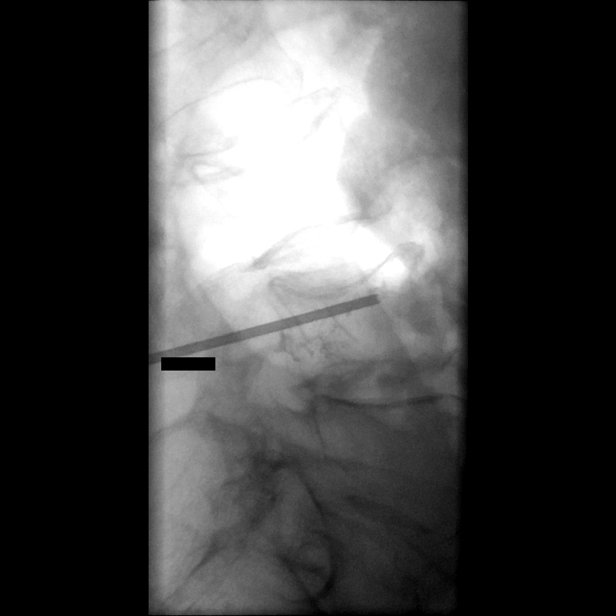
[im 12/45]
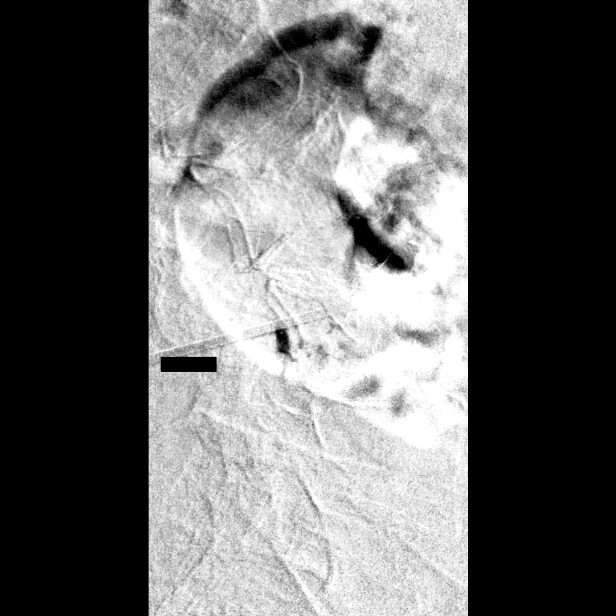
[im 16/45]
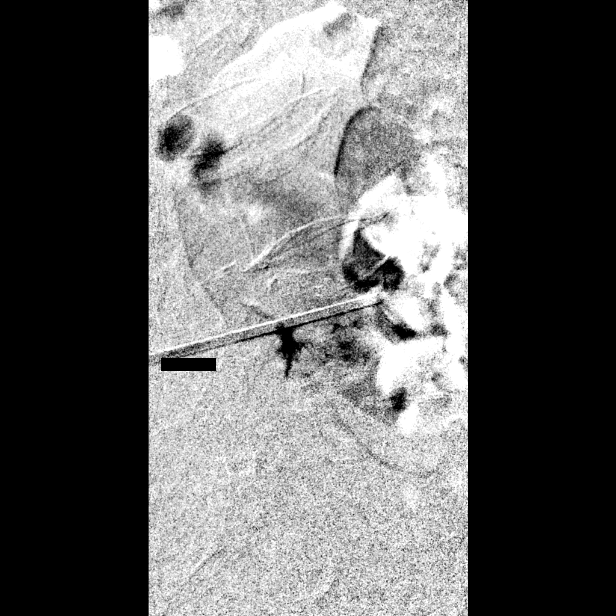
[im 20/45]
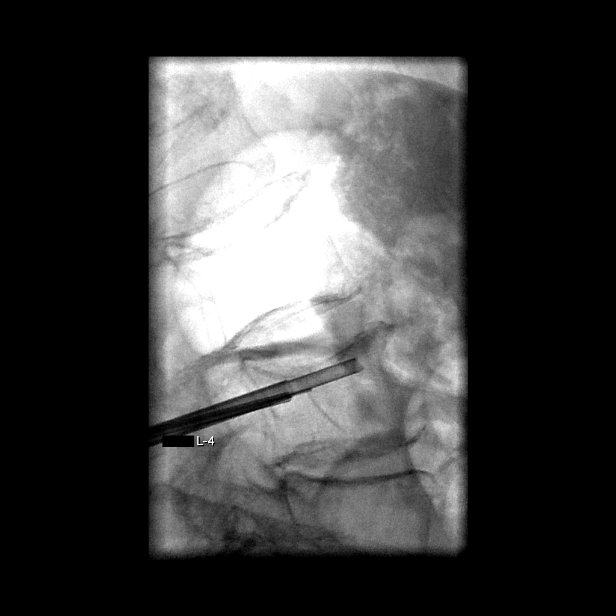
[im 23/45]
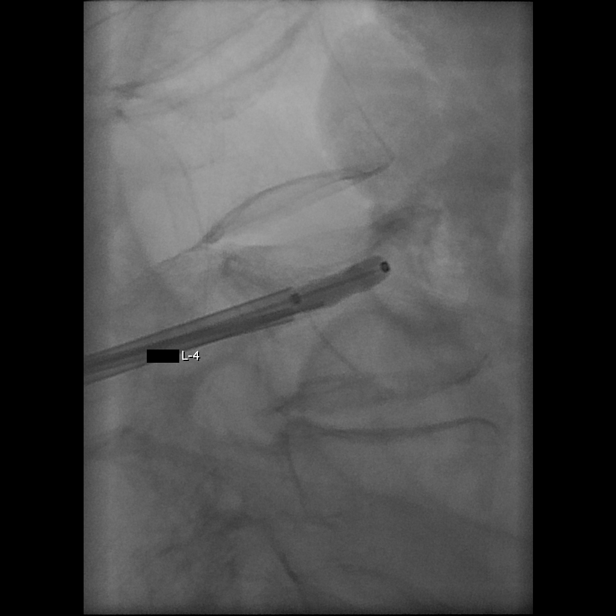
[im 25/45]
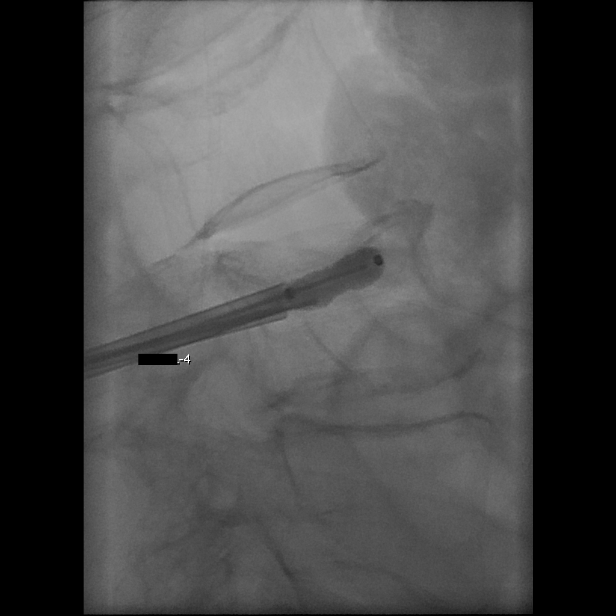
[im 29/45]
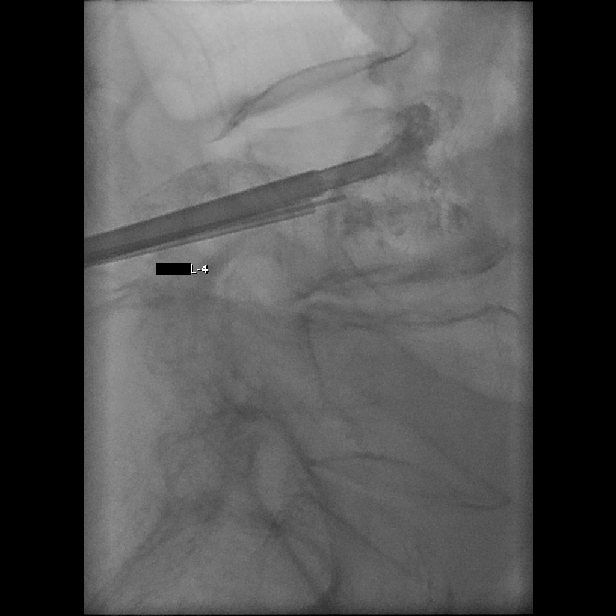
[im 33/45]
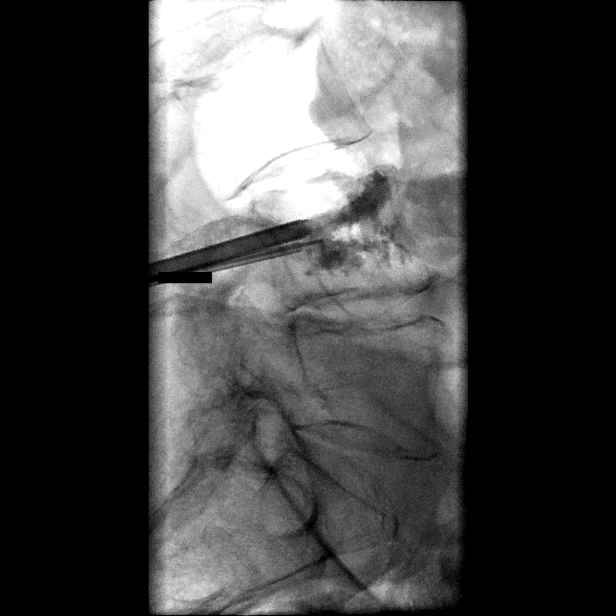
[im 37/45]
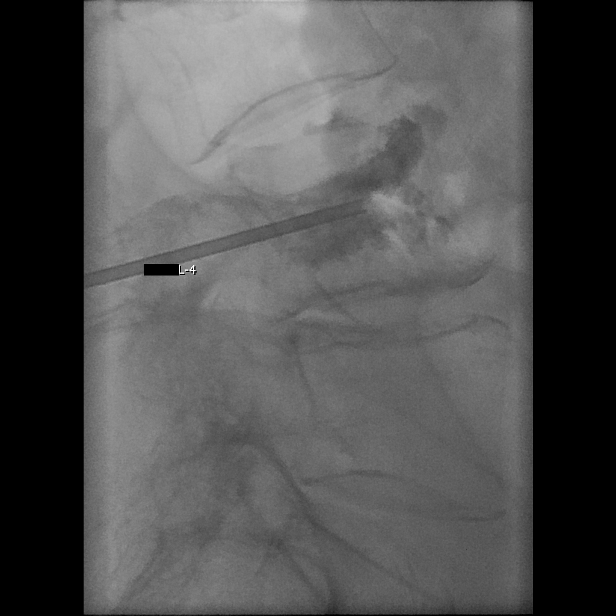
[im 41/45]
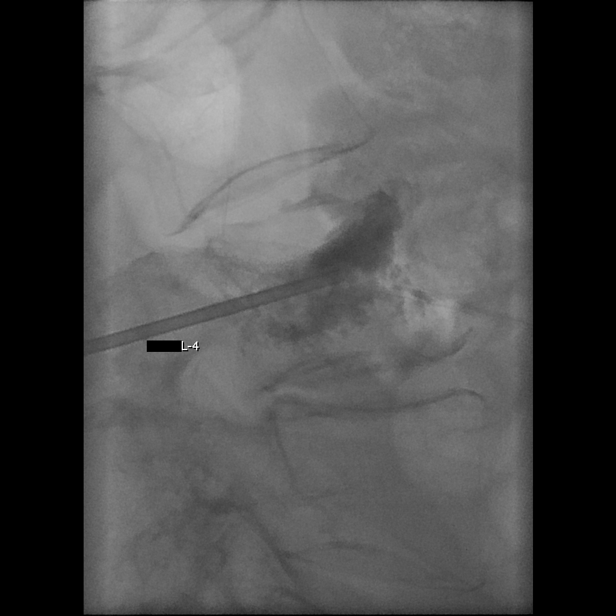
[im 45/45]
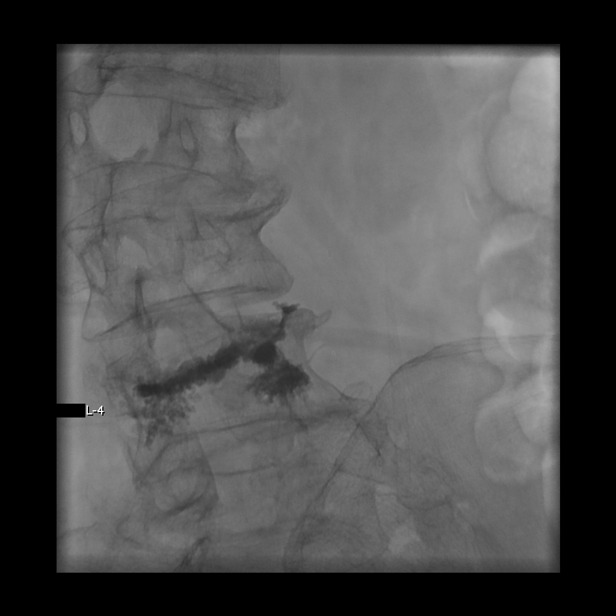

[13 of 24 positions shown; findings below may reference images not displayed]

EXAM:
KYPHOPLASTY AT L4

PROCEDURE:
Following a full explanation of the procedure along with the
potential associated complications, an informed witnessed consent
was obtained.

The patient was placed prone on the fluoroscopic table. The skin
overlying the lumbar region was then prepped and draped in the usual
sterile fashion. Both the pedicles at L4 were then infiltrated with
0.25% bupivacaine followed by the advancement of an 11-gauge
Jamshidi needle through both the L4 pedicles into the posterior
one-third at L4. This was then exchanged for a Kyphon advanced osteo
introducer system comprised of a working cannula and a Kyphon osteo
drill through the right pedicle.

This combination was then advanced over a Kyphon osteo bone pin
until the tip of the Kyphon osteo drill was in the posterior third
at L4.

At this time, the bone pin was removed. In a medial trajectory, the
combination was advanced until the tip of the working cannula was
inside the posterior one-third at L4.

The osteo drill was removed and a core sample sent for pathologic
analysis.

Through the working cannula, a Kyphon bone biopsy device was
advanced to within 5 mm of the anterior aspect of L4. A core sample
from this was also sent for pathologic analysis.

Through the working cannula, a Kyphon inflatable bone tamp 20 x 3
was advanced and positioned with the distal marker 5 mm from the
anterior aspect of L4. Crossing of the midline was seen on the AP
projection. At this time, the balloon was expanded using contrast
via a Kyphon inflation syringe device via microtubing.

Inflations were continued until there was apposition with the
superior end plates.

At this time, methylmethacrylate mixture was reconstituted with
Tobramycin in the Kyphon bone mixing device system. This was then
loaded onto the Kyphon bone fillers.

The balloon was deflated and removed followed by the instillation of
and 3 bone filler equivalents of methylmethacrylate mixture at L4
with excellent filling in the AP and lateral projections. No
extravasation was noted in the disk spaces or posteriorly into the
spinal canal. No epidural venous contamination was seen. However, no
crossing of the midline was seen across the superior endplate acute
fracture.

This prompted the advancement of a 13 gauge Ranjit spinal needle
through the left pedicle into the anterior one-third.
Methylmethacrylate mixture which had been reconstituted in the
Knudsen delivery device system, was then injected under biplane
intermittent fluoroscopy with excellent filling involving the left
hemivertebral body and also the superior endplate acute fracture.

No extravasation was seen into the disc spaces or posteriorly into
the spinal canal. The needles from both the pedicles were then
removed. Hemostasis was achieved at the skin entry sites.

The patient tolerated the procedure well. There were no acute
complications.

Medications utilized: Versed 5 mg mg IV and Fentanyl 175 micrograms
IV. Dilaudid 1 mg IV.

Total Moderate Sedation Time:  70 minutes.
IMPRESSION: 1. Status post fluoroscopic-guided needle placement for deep core
bone biopsy at L4.

2. Status post vertebral body augmentation using balloon kyphoplasty
at L4 as described without event.

## 2017-02-04 DIAGNOSIS — N200 Calculus of kidney: Secondary | ICD-10-CM | POA: Diagnosis not present

## 2017-02-18 DIAGNOSIS — F419 Anxiety disorder, unspecified: Secondary | ICD-10-CM | POA: Diagnosis not present

## 2017-02-18 DIAGNOSIS — Z96652 Presence of left artificial knee joint: Secondary | ICD-10-CM | POA: Diagnosis not present

## 2017-02-18 DIAGNOSIS — E039 Hypothyroidism, unspecified: Secondary | ICD-10-CM | POA: Diagnosis not present

## 2017-02-18 DIAGNOSIS — Z23 Encounter for immunization: Secondary | ICD-10-CM | POA: Diagnosis not present

## 2017-02-18 DIAGNOSIS — E7800 Pure hypercholesterolemia, unspecified: Secondary | ICD-10-CM | POA: Diagnosis not present

## 2017-02-18 DIAGNOSIS — I10 Essential (primary) hypertension: Secondary | ICD-10-CM | POA: Diagnosis not present

## 2017-02-18 DIAGNOSIS — M4716 Other spondylosis with myelopathy, lumbar region: Secondary | ICD-10-CM | POA: Diagnosis not present

## 2017-02-24 ENCOUNTER — Encounter: Payer: Self-pay | Admitting: Interventional Cardiology

## 2017-03-12 ENCOUNTER — Ambulatory Visit (INDEPENDENT_AMBULATORY_CARE_PROVIDER_SITE_OTHER): Payer: Medicare Other | Admitting: Interventional Cardiology

## 2017-03-12 ENCOUNTER — Encounter: Payer: Self-pay | Admitting: Interventional Cardiology

## 2017-03-12 VITALS — BP 110/62 | HR 77 | Ht 62.0 in | Wt 164.0 lb

## 2017-03-12 DIAGNOSIS — E782 Mixed hyperlipidemia: Secondary | ICD-10-CM | POA: Diagnosis not present

## 2017-03-12 DIAGNOSIS — I10 Essential (primary) hypertension: Secondary | ICD-10-CM

## 2017-03-12 NOTE — [place] Notes (Signed)
Cardiology Office Note   Date:  03/12/2017   ID:  Becky Duran, DOB November 09, 1932, MRN [id number]  PCP:  [name], MD    No chief complaint on file. HTN   Wt Readings from Last 3 Encounters:  03/12/17 164 lb (74.4 kg)  03/10/16 162 lb 9.6 oz (73.8 kg)  06/21/15 162 lb (73.5 kg)       History of Present Illness: Becky Duran is a 83 y.o. female  who has had difficult to control BP.  BP readings improved after medication adjustment. Walking limited by back pain, chronically. Had temporary relief from stimulator placed in 2016.   She received steroid injections as well at one point but they seemed to stop working.  Has taken oxycodone for pain along with acetaminophen.    When she gets a headache, BP tends to be high.   Denies : Chest pain. Dizziness. Leg edema. Nitroglycerin use. Orthopnea. Palpitations. Paroxysmal nocturnal dyspnea. Shortness of breath. Syncope.   Back pain limits her the most.  She only walks short distances.  Water aerobics was painful for her.  No bleeding problems.  No recent headaches or elevated blood pressure readings.  She does check her blood pressure at home.    Past Medical History:  Diagnosis Date  . Chronic low back pain   . CKD (chronic kidney disease), stage II   . Depression   . GERD (gastroesophageal reflux disease)   . Heart murmur   . Hiatal hernia   . History of endometriosis   . History of kidney stones   . Hyperlipidemia   . Hypertension cardiologist-  dr Irish Lack   pt states intermittant bp spike at night cause headache  . Hypothyroidism   . Narcotic drug use   . Nocturia   . Osteoarthritis   . Osteopenia   . Psoriasis   . Renal calculus, bilateral   . Scoliosis   . Wears contact lenses    right eye only    Past Surgical History:  Procedure Laterality Date  . ABDOMINAL HYSTERECTOMY  age 72  . BREAST LUMPECTOMY  1990's    bengin  . CARDIAC CATHETERIZATION  01-23-2010  dr Irish Lack   no hemodynamically  significant CAD/  normal LVF  . CARDIOVASCULAR STRESS TEST  08-16-2008  dr Irish Lack   normal nuclear perfusion study/  no ischemia/  normal LV function and wall motion, ef 94%  . CATARACT EXTRACTION W/ INTRAOCULAR LENS  IMPLANT, BILATERAL  2001  . colonscopy    . EXTRACORPOREAL SHOCK WAVE LITHOTRIPSY  03-09-2014  . LIPOMA EXCISION  02-05-2011   Back  . PERCUTANEOUS NEPHROSTOLITHOTOMY Left 09-19-2009  . TRANSTHORACIC ECHOCARDIOGRAM  12-28-2009   normal LVF, ef 60-65%/  mild AV sclerosis without stenosis/  mild AR, MR and TR/  trace PR     Current Outpatient Medications  Medication Sig Dispense Refill  . acetaminophen (TYLENOL ARTHRITIS PAIN) 650 MG CR tablet Take 650 mg by mouth 2 (two) times daily.    Marland Kitchen amLODipine (NORVASC) 5 MG tablet Take 5 mg by mouth every morning.     Marland Kitchen aspirin 81 MG chewable tablet Chew 81 mg by mouth daily.     Marland Kitchen atorvastatin (LIPITOR) 20 MG tablet Take 20 mg by mouth every evening.     Marland Kitchen b complex vitamins tablet Take 1 tablet by mouth every morning.    . clarithromycin (BIAXIN) 500 MG tablet Take 500 mg as needed by mouth.    . DOCUSATE CALCIUM  PO Take 2 capsules daily by mouth.    . doxycycline (VIBRA-TABS) 100 MG tablet Take 1 tablet (100 mg total) by mouth 2 (two) times daily. 10 tablet 0  . FLUoxetine (PROZAC) 20 MG capsule Take 20 mg by mouth every morning.     Marland Kitchen levothyroxine (SYNTHROID, LEVOTHROID) 75 MCG tablet Take 75 mcg by mouth daily before breakfast.     . Liniments (BLUE-EMU SUPER STRENGTH) CREA Apply to back as needed for pain.    Marland Kitchen loratadine (CLARITIN) 10 MG tablet Take 10 mg by mouth daily as needed for allergies.     Marland Kitchen losartan (COZAAR) 50 MG tablet Take 50 mg by mouth every evening.     . Multiple Vitamin (MULTIVITAMIN WITH MINERALS) TABS tablet Take 1 tablet by mouth every morning.    . Oxycodone HCl 10 MG TABS Take 10 mg by mouth every 5 (five) hours as needed (pain).     . Probiotic Product (PROBIOTIC DAILY PO) Take 1 tablet by mouth  every morning.    . ranitidine (ZANTAC) 150 MG tablet Take 150 mg by mouth daily. MORNING    . vitamin B-12 (CYANOCOBALAMIN) 1000 MCG tablet Take 1,000 mcg by mouth every morning.    . vitamin C (ASCORBIC ACID) 500 MG tablet Take 500 mg daily by mouth.     No current facility-administered medications for this visit.     Allergies:   Colace [docusate sodium]; Etodolac; Penicillins; Relafen [nabumetone]; Valacyclovir hcl; Diclofenac sodium; Ciprofloxacin; and Sulfa antibiotics    Social History:  The patient  reports that  has never smoked. she has never used smokeless tobacco. She reports that she does not drink alcohol or use drugs.   Family History:  The patient's family history includes Anesthesia problems in her mother; Heart attack in her brother; Hypertension in her brother, mother, and sister; Stroke (age of onset: 17) in her maternal grandmother; Stroke (age of onset: 33) in her maternal grandfather.    ROS:  Please see the history of present illness.   Otherwise, review of systems are positive for back pain.   All other systems are reviewed and negative.    PHYSICAL EXAM: VS:  Ht 5\' 2"  (1.575 m)   Wt 164 lb (74.4 kg)   BMI 30.00 kg/m  , BMI Body mass index is 30 kg/m. GEN: Well nourished, well developed, in no acute distress  HEENT: normal  Neck: no JVD, carotid bruits, or masses Cardiac: RRR; 2 out of 6 systolic murmur, no rubs, or gallops,no edema  Respiratory:  clear to auscultation bilaterally, normal work of breathing GI: soft, nontender, nondistended, + BS MS: no deformity or atrophy  Skin: warm and dry, no rash Neuro:  Strength and sensation are intact Psych: euthymic mood, full affect   EKG:   The ekg ordered today demonstrates normal sinus rhythm no significant ST changes   Recent Labs: No results found for requested labs within last 8760 hours.   Lipid Panel    Component Value Date/Time   CHOL  12/26/2009 0335    167        ATP III CLASSIFICATION:   <200     mg/dL   Desirable  200-239  mg/dL   Borderline High  >=240    mg/dL   High          TRIG 68 12/26/2009 0335   HDL 56 12/26/2009 0335   CHOLHDL 3.0 12/26/2009 0335   VLDL 14 12/26/2009 0335   LDLCALC  12/26/2009 0335  97        Total Cholesterol/HDL:CHD Risk Coronary Heart Disease Risk Table                     Men   Women  1/2 Average Risk   3.4   3.3  Average Risk       5.0   4.4  2 X Average Risk   9.6   7.1  3 X Average Risk  23.4   11.0        Use the calculated Patient Ratio above and the CHD Risk Table to determine the patient's CHD Risk.        ATP III CLASSIFICATION (LDL):  <100     mg/dL   Optimal  100-129  mg/dL   Near or Above                    Optimal  130-159  mg/dL   Borderline  160-189  mg/dL   High  >190     mg/dL   Very High     Other studies Reviewed: Additional studies/ records that were reviewed today with results demonstrating: lipids reviewed LDL 71 in 10/18, HDL 69, Cr 0.85, normal ALT.   ASSESSMENT AND PLAN:  1. Hypertension: Blood pressure well controlled.  No further headaches.  Continue current medications.  Lower dose of amlodipine has been tried in the past but her blood pressure went up.  Continue 5 mg dose. 2. Hyperlipidemia: Lipids well controlled.  Continue current dose of atorvastatin.    Current medicines are reviewed at length with the patient today.  The patient concerns regarding her medicines were addressed.  The following changes have been made:  No change  Labs/ tests ordered today include:  No orders of the defined types were placed in this encounter.   Recommend 150 minutes/week of aerobic exercise Low fat, low carb, high fiber diet recommended  Disposition:   FU in 1 year   Signed, Larae Grooms, MD  03/12/2017 2:32 PM    [facility] [address], [place], [place]  [address] Phone: [phone]; Fax: [phone]

## 2017-03-12 NOTE — Patient Instructions (Signed)

## 2017-04-09 DIAGNOSIS — M25531 Pain in right wrist: Secondary | ICD-10-CM | POA: Diagnosis not present

## 2017-04-17 DIAGNOSIS — M545 Low back pain: Secondary | ICD-10-CM | POA: Diagnosis not present

## 2017-04-17 DIAGNOSIS — M5416 Radiculopathy, lumbar region: Secondary | ICD-10-CM | POA: Diagnosis not present

## 2017-04-17 DIAGNOSIS — M542 Cervicalgia: Secondary | ICD-10-CM | POA: Diagnosis not present

## 2017-04-17 DIAGNOSIS — M47897 Other spondylosis, lumbosacral region: Secondary | ICD-10-CM | POA: Diagnosis not present

## 2017-04-17 DIAGNOSIS — M5412 Radiculopathy, cervical region: Secondary | ICD-10-CM | POA: Diagnosis not present

## 2017-06-23 DIAGNOSIS — Z85828 Personal history of other malignant neoplasm of skin: Secondary | ICD-10-CM | POA: Diagnosis not present

## 2017-06-23 DIAGNOSIS — L814 Other melanin hyperpigmentation: Secondary | ICD-10-CM | POA: Diagnosis not present

## 2017-06-23 DIAGNOSIS — L570 Actinic keratosis: Secondary | ICD-10-CM | POA: Diagnosis not present

## 2017-06-23 DIAGNOSIS — D225 Melanocytic nevi of trunk: Secondary | ICD-10-CM | POA: Diagnosis not present

## 2017-07-16 DIAGNOSIS — M5033 Other cervical disc degeneration, cervicothoracic region: Secondary | ICD-10-CM | POA: Diagnosis not present

## 2017-07-16 DIAGNOSIS — M5030 Other cervical disc degeneration, unspecified cervical region: Secondary | ICD-10-CM | POA: Diagnosis not present

## 2017-07-16 DIAGNOSIS — M5136 Other intervertebral disc degeneration, lumbar region: Secondary | ICD-10-CM | POA: Diagnosis not present

## 2017-08-20 DIAGNOSIS — E039 Hypothyroidism, unspecified: Secondary | ICD-10-CM | POA: Diagnosis not present

## 2017-08-20 DIAGNOSIS — M179 Osteoarthritis of knee, unspecified: Secondary | ICD-10-CM | POA: Diagnosis not present

## 2017-08-20 DIAGNOSIS — F324 Major depressive disorder, single episode, in partial remission: Secondary | ICD-10-CM | POA: Diagnosis not present

## 2017-08-20 DIAGNOSIS — E7800 Pure hypercholesterolemia, unspecified: Secondary | ICD-10-CM | POA: Diagnosis not present

## 2017-08-20 DIAGNOSIS — I10 Essential (primary) hypertension: Secondary | ICD-10-CM | POA: Diagnosis not present

## 2017-09-14 DIAGNOSIS — N3000 Acute cystitis without hematuria: Secondary | ICD-10-CM | POA: Diagnosis not present

## 2017-09-14 DIAGNOSIS — R350 Frequency of micturition: Secondary | ICD-10-CM | POA: Diagnosis not present

## 2017-10-27 DIAGNOSIS — M5489 Other dorsalgia: Secondary | ICD-10-CM | POA: Diagnosis not present

## 2017-10-27 DIAGNOSIS — R52 Pain, unspecified: Secondary | ICD-10-CM | POA: Diagnosis not present

## 2017-11-17 DIAGNOSIS — R928 Other abnormal and inconclusive findings on diagnostic imaging of breast: Secondary | ICD-10-CM | POA: Diagnosis not present

## 2017-11-17 DIAGNOSIS — N6324 Unspecified lump in the left breast, lower inner quadrant: Secondary | ICD-10-CM | POA: Diagnosis not present

## 2017-11-23 DIAGNOSIS — Z961 Presence of intraocular lens: Secondary | ICD-10-CM | POA: Diagnosis not present

## 2017-11-23 DIAGNOSIS — H524 Presbyopia: Secondary | ICD-10-CM | POA: Diagnosis not present

## 2017-11-23 DIAGNOSIS — H04123 Dry eye syndrome of bilateral lacrimal glands: Secondary | ICD-10-CM | POA: Diagnosis not present

## 2017-11-23 DIAGNOSIS — H43813 Vitreous degeneration, bilateral: Secondary | ICD-10-CM | POA: Diagnosis not present

## 2017-11-23 DIAGNOSIS — H5203 Hypermetropia, bilateral: Secondary | ICD-10-CM | POA: Diagnosis not present

## 2017-11-23 DIAGNOSIS — H353131 Nonexudative age-related macular degeneration, bilateral, early dry stage: Secondary | ICD-10-CM | POA: Diagnosis not present

## 2017-11-23 DIAGNOSIS — H52223 Regular astigmatism, bilateral: Secondary | ICD-10-CM | POA: Diagnosis not present

## 2017-11-25 DIAGNOSIS — M5136 Other intervertebral disc degeneration, lumbar region: Secondary | ICD-10-CM | POA: Diagnosis not present

## 2017-11-25 DIAGNOSIS — M5030 Other cervical disc degeneration, unspecified cervical region: Secondary | ICD-10-CM | POA: Diagnosis not present

## 2017-11-25 DIAGNOSIS — Z79891 Long term (current) use of opiate analgesic: Secondary | ICD-10-CM | POA: Diagnosis not present

## 2017-12-29 DIAGNOSIS — L570 Actinic keratosis: Secondary | ICD-10-CM | POA: Diagnosis not present

## 2017-12-29 DIAGNOSIS — C44319 Basal cell carcinoma of skin of other parts of face: Secondary | ICD-10-CM | POA: Diagnosis not present

## 2018-02-19 DIAGNOSIS — M4716 Other spondylosis with myelopathy, lumbar region: Secondary | ICD-10-CM | POA: Diagnosis not present

## 2018-02-19 DIAGNOSIS — I10 Essential (primary) hypertension: Secondary | ICD-10-CM | POA: Diagnosis not present

## 2018-02-19 DIAGNOSIS — Z23 Encounter for immunization: Secondary | ICD-10-CM | POA: Diagnosis not present

## 2018-02-19 DIAGNOSIS — E039 Hypothyroidism, unspecified: Secondary | ICD-10-CM | POA: Diagnosis not present

## 2018-02-19 DIAGNOSIS — F419 Anxiety disorder, unspecified: Secondary | ICD-10-CM | POA: Diagnosis not present

## 2018-02-19 DIAGNOSIS — E7800 Pure hypercholesterolemia, unspecified: Secondary | ICD-10-CM | POA: Diagnosis not present

## 2018-03-29 DIAGNOSIS — G894 Chronic pain syndrome: Secondary | ICD-10-CM | POA: Diagnosis not present

## 2018-03-30 DIAGNOSIS — C44319 Basal cell carcinoma of skin of other parts of face: Secondary | ICD-10-CM | POA: Diagnosis not present

## 2018-04-08 DIAGNOSIS — R350 Frequency of micturition: Secondary | ICD-10-CM | POA: Diagnosis not present

## 2018-05-11 ENCOUNTER — Ambulatory Visit: Payer: No Typology Code available for payment source | Admitting: Interventional Cardiology

## 2018-07-07 ENCOUNTER — Encounter: Payer: Self-pay | Admitting: Interventional Cardiology

## 2018-07-07 ENCOUNTER — Ambulatory Visit (INDEPENDENT_AMBULATORY_CARE_PROVIDER_SITE_OTHER): Payer: Medicare Other | Admitting: Interventional Cardiology

## 2018-07-07 VITALS — BP 90/60 | HR 76 | Ht 62.0 in | Wt 156.8 lb

## 2018-07-07 DIAGNOSIS — E782 Mixed hyperlipidemia: Secondary | ICD-10-CM | POA: Diagnosis not present

## 2018-07-07 DIAGNOSIS — I10 Essential (primary) hypertension: Secondary | ICD-10-CM

## 2018-07-07 DIAGNOSIS — G8929 Other chronic pain: Secondary | ICD-10-CM

## 2018-07-07 DIAGNOSIS — M549 Dorsalgia, unspecified: Secondary | ICD-10-CM

## 2018-07-07 MED ORDER — AMLODIPINE BESYLATE 2.5 MG PO TABS: 2.5000 mg | ORAL_TABLET | Freq: Every day | ORAL | 3 refills | Status: DC

## 2018-07-07 NOTE — Patient Instructions (Signed)
Medication Instructions:  Your physician has recommended you make the following change in your medication:   DECREASE: amlodipine to 2.5 mg daily  Monitor your Blood Pressure for a week and call back to report readings  Lab work: None Ordered  If you have labs (blood work) drawn today and your tests are completely normal, you will receive your results only by: Marland Kitchen MyChart Message (if you have MyChart) OR . A paper copy in the mail If you have any lab test that is abnormal or we need to change your treatment, we will call you to review the results.  Testing/Procedures: None ordered  Follow-Up: At [facility], you and your health needs are our priority.  As part of our continuing mission to provide you with exceptional heart care, we have created designated Provider Care Teams.  These Care Teams include your primary Cardiologist (physician) and Advanced Practice Providers (APPs -  Physician Assistants and Nurse Practitioners) who all work together to provide you with the care you need, when you need it. . You will need a follow up appointment in 1 year.  Please call our office 2 months in advance to schedule this appointment.  You may see Casandra Doffing, MD or one of the following Advanced Practice Providers on your designated Care Team:   . Lyda Jester, PA-C . Dayna Dunn, PA-C . Ermalinda Barrios, PA-C  Any Other Special Instructions Will Be Listed Below (If Applicable).

## 2018-07-07 NOTE — [place] Notes (Signed)
Cardiology Office Note   Date:  07/07/2018   ID:  Becky Duran, DOB 1933/02/05, MRN [id number]  PCP:  [name], MD    No chief complaint on file.  HTN  Wt Readings from Last 3 Encounters:  07/07/18 156 lb 12.8 oz (71.1 kg)  03/12/17 164 lb (74.4 kg)  03/10/16 162 lb 9.6 oz (73.8 kg)       History of Present Illness: Becky Duran is a 85 y.o. female   who has had difficult to control BP.  BP readings improved after medication adjustment. Walking limited by back pain, chronically.Had temporary relief from stimulator placed in 2016. She received steroid injections as well at one point but they seemed to stop working. Has taken oxycodone for pain along with acetaminophen.   When she gets a headache, BP tends to be high.  Back pain limits her the most.  She only walks short distances.  Water aerobics was painful for her.  Denies : Chest pain. Dizziness. Leg edema. Nitroglycerin use. Orthopnea. Palpitations. Paroxysmal nocturnal dyspnea. Shortness of breath. Syncope.   BP has been controlled.   Past Medical History:  Diagnosis Date  . Chronic low back pain   . CKD (chronic kidney disease), stage II   . Depression   . GERD (gastroesophageal reflux disease)   . Heart murmur   . Hiatal hernia   . History of endometriosis   . History of kidney stones   . Hyperlipidemia   . Hypertension cardiologist-  dr Irish Lack   pt states intermittant bp spike at night cause headache  . Hypothyroidism   . Narcotic drug use   . Nocturia   . Osteoarthritis   . Osteopenia   . Psoriasis   . Renal calculus, bilateral   . Scoliosis   . Wears contact lenses    right eye only    Past Surgical History:  Procedure Laterality Date  . ABDOMINAL HYSTERECTOMY  age 33  . BREAST LUMPECTOMY  1990's    bengin  . CARDIAC CATHETERIZATION  01-23-2010  dr Irish Lack   no hemodynamically significant CAD/  normal LVF  . CARDIOVASCULAR STRESS TEST  08-16-2008  dr Irish Lack   normal  nuclear perfusion study/  no ischemia/  normal LV function and wall motion, ef 94%  . CATARACT EXTRACTION W/ INTRAOCULAR LENS  IMPLANT, BILATERAL  2001  . colonscopy    . CYSTOSCOPY W/ URETERAL STENT REMOVAL N/A 06/21/2015   Procedure: CYSTOSCOPY WITH STENT REMOVAL;  Surgeon: Franchot Gallo, MD;  Location[facility];  Service: Urology;  Laterality: N/A;  . CYSTOSCOPY WITH URETEROSCOPY AND STENT PLACEMENT Bilateral 06/21/2015   Procedure: CYSTOSCOPY WITH URETEROSCOPY AND  STENT PLACEMENT;  Surgeon: Franchot Gallo, MD;  Location: [facility];  Service: Urology;  Laterality: Bilateral;  . EXTRACORPOREAL SHOCK WAVE LITHOTRIPSY  03-09-2014  . HOLMIUM LASER APPLICATION N/A 0/96/2836   Procedure: HOLMIUM LASER APPLICATION;  Surgeon: Franchot Gallo, MD;  Location: [facility];  Service: Urology;  Laterality: N/A;  . LIPOMA EXCISION  02-05-2011   Back  . LUMBAR LAMINECTOMY/DECOMPRESSION MICRODISCECTOMY  06/04/2011   Procedure: LUMBAR LAMINECTOMY/DECOMPRESSION MICRODISCECTOMY;  Surgeon: Floyce Stakes, MD;  Location: [facility];  Service: Neurosurgery;  Laterality: N/A;  Lumbar Four-Five partial Lumbar Three Laminectomy  . PERCUTANEOUS NEPHROSTOLITHOTOMY Left 09-19-2009  . TOTAL KNEE ARTHROPLASTY Left 03/21/2014   Procedure: TOTAL KNEE ARTHROPLASTY;  Surgeon: Mauri Pole, MD;  Location: [facility];  Service: Orthopedics;  Laterality: Left;  left femoral nerve block  . TRANSTHORACIC ECHOCARDIOGRAM  12-28-2009   normal LVF, ef 60-65%/  mild AV sclerosis without stenosis/  mild AR, MR and TR/  trace PR     Current Outpatient Medications  Medication Sig Dispense Refill  . acetaminophen (TYLENOL ARTHRITIS PAIN) 650 MG CR tablet Take 650 mg by mouth 2 (two) times daily.    Marland Kitchen amLODipine (NORVASC) 5 MG tablet Take 5 mg by mouth every morning.     Marland Kitchen aspirin 81 MG chewable tablet Chew 81 mg by mouth daily.     Marland Kitchen atorvastatin (LIPITOR) 20 MG tablet Take  20 mg by mouth every evening.     Marland Kitchen b complex vitamins tablet Take 1 tablet by mouth every morning.    . clarithromycin (BIAXIN) 500 MG tablet Take 500 mg as needed by mouth.    . DOCUSATE CALCIUM PO Take 2 capsules daily by mouth.    . doxycycline (VIBRA-TABS) 100 MG tablet Take 1 tablet (100 mg total) by mouth 2 (two) times daily. 10 tablet 0  . FLUoxetine (PROZAC) 20 MG capsule Take 20 mg by mouth every morning.     Marland Kitchen levothyroxine (SYNTHROID, LEVOTHROID) 75 MCG tablet Take 75 mcg by mouth daily before breakfast.     . Liniments (BLUE-EMU SUPER STRENGTH) CREA Apply to back as needed for pain.    Marland Kitchen loratadine (CLARITIN) 10 MG tablet Take 10 mg by mouth daily as needed for allergies.     Marland Kitchen losartan (COZAAR) 50 MG tablet Take 50 mg by mouth every evening.     . Multiple Vitamin (MULTIVITAMIN WITH MINERALS) TABS tablet Take 1 tablet by mouth every morning.    . Oxycodone HCl 10 MG TABS Take 10 mg by mouth every 5 (five) hours as needed (pain).     . Probiotic Product (PROBIOTIC DAILY PO) Take 1 tablet by mouth every morning.    . ranitidine (ZANTAC) 150 MG tablet Take 150 mg by mouth daily. MORNING    . vitamin B-12 (CYANOCOBALAMIN) 1000 MCG tablet Take 1,000 mcg by mouth every morning.    . vitamin C (ASCORBIC ACID) 500 MG tablet Take 500 mg daily by mouth.     No current facility-administered medications for this visit.     Allergies:   Colace [docusate sodium]; Etodolac; Penicillins; Relafen [nabumetone]; Valacyclovir hcl; Diclofenac sodium; Ciprofloxacin; and Sulfa antibiotics    Social History:  The patient  reports that she has never smoked. She has never used smokeless tobacco. She reports that she does not drink alcohol or use drugs.   Family History:  The patient's family history includes Anesthesia problems in her mother; Heart attack in her brother; Hypertension in her brother, mother, and sister; Stroke (age of onset: 22) in her maternal grandmother; Stroke (age of onset: 79) in  her maternal grandfather.    ROS:  Please see the history of present illness.   Otherwise, review of systems are positive for back pain.   All other systems are reviewed and negative.    PHYSICAL EXAM: VS:  BP 90/60   Pulse 76   Ht 5\' 2"  (1.575 m)   Wt 156 lb 12.8 oz (71.1 kg)   SpO2 94%   BMI 28.68 kg/m  , BMI Body mass index is 28.68 kg/m. GEN: Well nourished, well developed, in no acute distress  HEENT: normal  Neck: no JVD, carotid bruits, or masses Cardiac: RRR; no murmurs, rubs, or gallops,no edema  Respiratory:  clear to auscultation bilaterally, normal  work of breathing GI: soft, nontender, nondistended, + BS MS: no deformity or atrophy  Skin: warm and dry, no rash Neuro:  Strength and sensation are intact Psych: euthymic mood, full affect   EKG:   The ekg ordered today demonstrates NSR, no ST changes   Recent Labs: No results found for requested labs within last 8760 hours.   Lipid Panel    Component Value Date/Time   CHOL  12/26/2009 0335    167        ATP III CLASSIFICATION:  <200     mg/dL   Desirable  200-239  mg/dL   Borderline High  >=240    mg/dL   High          TRIG 68 12/26/2009 0335   HDL 56 12/26/2009 0335   CHOLHDL 3.0 12/26/2009 0335   VLDL 14 12/26/2009 0335   LDLCALC  12/26/2009 0335    97        Total Cholesterol/HDL:CHD Risk Coronary Heart Disease Risk Table                     Men   Women  1/2 Average Risk   3.4   3.3  Average Risk       5.0   4.4  2 X Average Risk   9.6   7.1  3 X Average Risk  23.4   11.0        Use the calculated Patient Ratio above and the CHD Risk Table to determine the patient's CHD Risk.        ATP III CLASSIFICATION (LDL):  <100     mg/dL   Optimal  100-129  mg/dL   Near or Above                    Optimal  130-159  mg/dL   Borderline  160-189  mg/dL   High  >190     mg/dL   Very High     Other studies Reviewed: Additional studies/ records that were reviewed today with results demonstrating:  labs reviewed.   ASSESSMENT AND PLAN:  1. Hypertension: Blood pressure well controlled.  Borderline low blood pressure here in the office today on the initial check.  I repeated the blood pressure check and she was in the 104/60 range.  We will decrease amlodipine to 2.5 mg.  If her blood pressure is elevated, will go back to the 5 mg dose.  If her blood pressure stays low, could try to stop the amlodipine altogether.  She has lost weight since she had the severe blood pressure spikes several years ago. 2. Hyperlipidemia: LDL 78 in October 2019.  Continue atorvastatin. 3. Chronic back pain: I suggested trying a stationary bike to help with increasing activity.    Current medicines are reviewed at length with the patient today.  The patient concerns regarding her medicines were addressed.  The following changes have been made:  No change  Labs/ tests ordered today include:  No orders of the defined types were placed in this encounter.   Recommend 150 minutes/week of aerobic exercise Low fat, low carb, high fiber diet recommended  Disposition:   FU in 1 year   Signed, Larae Grooms, MD  07/07/2018 4:39 PM    [facility] [place] [address], [place], [place]  [address] Phone: [phone]; Fax: [phone]

## 2018-09-01 DIAGNOSIS — G894 Chronic pain syndrome: Secondary | ICD-10-CM | POA: Diagnosis not present

## 2018-11-30 DIAGNOSIS — E039 Hypothyroidism, unspecified: Secondary | ICD-10-CM | POA: Diagnosis not present

## 2018-11-30 DIAGNOSIS — E7800 Pure hypercholesterolemia, unspecified: Secondary | ICD-10-CM | POA: Diagnosis not present

## 2018-11-30 DIAGNOSIS — M179 Osteoarthritis of knee, unspecified: Secondary | ICD-10-CM | POA: Diagnosis not present

## 2018-11-30 DIAGNOSIS — K219 Gastro-esophageal reflux disease without esophagitis: Secondary | ICD-10-CM | POA: Diagnosis not present

## 2018-11-30 DIAGNOSIS — I10 Essential (primary) hypertension: Secondary | ICD-10-CM | POA: Diagnosis not present

## 2018-11-30 DIAGNOSIS — F419 Anxiety disorder, unspecified: Secondary | ICD-10-CM | POA: Diagnosis not present

## 2018-11-30 DIAGNOSIS — F324 Major depressive disorder, single episode, in partial remission: Secondary | ICD-10-CM | POA: Diagnosis not present

## 2019-01-03 DIAGNOSIS — M5136 Other intervertebral disc degeneration, lumbar region: Secondary | ICD-10-CM | POA: Diagnosis not present

## 2019-01-03 DIAGNOSIS — M5030 Other cervical disc degeneration, unspecified cervical region: Secondary | ICD-10-CM | POA: Diagnosis not present

## 2019-01-03 DIAGNOSIS — Z5181 Encounter for therapeutic drug level monitoring: Secondary | ICD-10-CM | POA: Diagnosis not present

## 2019-01-03 DIAGNOSIS — Z79899 Other long term (current) drug therapy: Secondary | ICD-10-CM | POA: Diagnosis not present

## 2019-01-03 DIAGNOSIS — Z79891 Long term (current) use of opiate analgesic: Secondary | ICD-10-CM | POA: Diagnosis not present

## 2019-02-14 DIAGNOSIS — H04123 Dry eye syndrome of bilateral lacrimal glands: Secondary | ICD-10-CM | POA: Diagnosis not present

## 2019-02-14 DIAGNOSIS — H43813 Vitreous degeneration, bilateral: Secondary | ICD-10-CM | POA: Diagnosis not present

## 2019-02-14 DIAGNOSIS — H353131 Nonexudative age-related macular degeneration, bilateral, early dry stage: Secondary | ICD-10-CM | POA: Diagnosis not present

## 2019-02-14 DIAGNOSIS — Z961 Presence of intraocular lens: Secondary | ICD-10-CM | POA: Diagnosis not present

## 2019-02-15 DIAGNOSIS — Z1231 Encounter for screening mammogram for malignant neoplasm of breast: Secondary | ICD-10-CM | POA: Diagnosis not present

## 2019-03-02 DIAGNOSIS — Z23 Encounter for immunization: Secondary | ICD-10-CM | POA: Diagnosis not present

## 2019-03-09 DIAGNOSIS — M48062 Spinal stenosis, lumbar region with neurogenic claudication: Secondary | ICD-10-CM | POA: Diagnosis not present

## 2019-03-09 DIAGNOSIS — M5010 Cervical disc disorder with radiculopathy, unspecified cervical region: Secondary | ICD-10-CM | POA: Diagnosis not present

## 2019-03-25 DIAGNOSIS — M4725 Other spondylosis with radiculopathy, thoracolumbar region: Secondary | ICD-10-CM | POA: Diagnosis not present

## 2019-03-25 DIAGNOSIS — M5412 Radiculopathy, cervical region: Secondary | ICD-10-CM | POA: Diagnosis not present

## 2019-03-25 DIAGNOSIS — M5417 Radiculopathy, lumbosacral region: Secondary | ICD-10-CM | POA: Diagnosis not present

## 2019-05-25 ENCOUNTER — Ambulatory Visit: Payer: Medicare Other | Attending: Internal Medicine

## 2019-05-25 DIAGNOSIS — Z23 Encounter for immunization: Secondary | ICD-10-CM | POA: Insufficient documentation

## 2019-05-25 NOTE — [place] Notes (Signed)
   Covid-19 Vaccination Clinic  Name:  Becky Duran    MRN: [id number] DOB: 03/12/1933  05/25/2019  Ms. Bronder was observed post Covid-19 immunization for 15 minutes without incidence. She was provided with Vaccine Information Sheet and instruction to access the V-Safe system.   Ms. Mcmurry was instructed to call 911 with any severe reactions post vaccine: Marland Kitchen Difficulty breathing  . Swelling of your face and throat  . A fast heartbeat  . A bad rash all over your body  . Dizziness and weakness    Immunizations Administered    Name Date Dose VIS Date Route   Pfizer COVID-19 Vaccine 05/25/2019 12:09 PM 0.3 mL 04/15/2019 Intramuscular   Manufacturer: [place]   Lot: [id number]   [place]: [id number]

## 2019-06-03 DIAGNOSIS — E039 Hypothyroidism, unspecified: Secondary | ICD-10-CM | POA: Diagnosis not present

## 2019-06-03 DIAGNOSIS — M179 Osteoarthritis of knee, unspecified: Secondary | ICD-10-CM | POA: Diagnosis not present

## 2019-06-03 DIAGNOSIS — I10 Essential (primary) hypertension: Secondary | ICD-10-CM | POA: Diagnosis not present

## 2019-06-03 DIAGNOSIS — E7800 Pure hypercholesterolemia, unspecified: Secondary | ICD-10-CM | POA: Diagnosis not present

## 2019-06-03 DIAGNOSIS — F324 Major depressive disorder, single episode, in partial remission: Secondary | ICD-10-CM | POA: Diagnosis not present

## 2019-06-13 ENCOUNTER — Ambulatory Visit: Payer: No Typology Code available for payment source

## 2019-06-15 ENCOUNTER — Ambulatory Visit: Payer: Medicare Other | Attending: Internal Medicine

## 2019-06-15 DIAGNOSIS — Z23 Encounter for immunization: Secondary | ICD-10-CM

## 2019-06-15 NOTE — [place] Notes (Signed)
   Covid-19 Vaccination Clinic  Name:  Becky Duran    MRN: [id number] DOB: 11/24/1932  06/15/2019  [name] was observed post Covid-19 immunization for 15 minutes without incidence. She was provided with Vaccine Information Sheet and instruction to access the V-Safe system.   [name] was instructed to call 911 with any severe reactions post vaccine: Marland Kitchen Difficulty breathing  . Swelling of your face and throat  . A fast heartbeat  . A bad rash all over your body  . Dizziness and weakness    Immunizations Administered    Name Date Dose VIS Date Route   Pfizer COVID-19 Vaccine 06/15/2019  5:38 PM 0.3 mL 04/15/2019 Intramuscular   Manufacturer: [place]   Lot: [id number]   [place]: [id number]

## 2019-06-24 DIAGNOSIS — F419 Anxiety disorder, unspecified: Secondary | ICD-10-CM | POA: Diagnosis not present

## 2019-06-24 DIAGNOSIS — K219 Gastro-esophageal reflux disease without esophagitis: Secondary | ICD-10-CM | POA: Diagnosis not present

## 2019-06-24 DIAGNOSIS — I10 Essential (primary) hypertension: Secondary | ICD-10-CM | POA: Diagnosis not present

## 2019-06-24 DIAGNOSIS — R8281 Pyuria: Secondary | ICD-10-CM | POA: Diagnosis not present

## 2019-06-24 DIAGNOSIS — E7800 Pure hypercholesterolemia, unspecified: Secondary | ICD-10-CM | POA: Diagnosis not present

## 2019-06-24 DIAGNOSIS — E039 Hypothyroidism, unspecified: Secondary | ICD-10-CM | POA: Diagnosis not present

## 2019-06-24 DIAGNOSIS — F324 Major depressive disorder, single episode, in partial remission: Secondary | ICD-10-CM | POA: Diagnosis not present

## 2019-07-07 DIAGNOSIS — Z79891 Long term (current) use of opiate analgesic: Secondary | ICD-10-CM | POA: Diagnosis not present

## 2019-07-07 DIAGNOSIS — M5030 Other cervical disc degeneration, unspecified cervical region: Secondary | ICD-10-CM | POA: Diagnosis not present

## 2019-07-07 DIAGNOSIS — G894 Chronic pain syndrome: Secondary | ICD-10-CM | POA: Diagnosis not present

## 2019-07-07 DIAGNOSIS — M5136 Other intervertebral disc degeneration, lumbar region: Secondary | ICD-10-CM | POA: Diagnosis not present

## 2019-07-10 NOTE — Progress Notes (Signed)
Cardiology Office Note   Date:  07/11/2019   ID:  Becky Duran, DOB [birth year], MRN [id number]  PCP:  [name], MD    No chief complaint on file.  Hypertension  Wt Readings from Last 3 Encounters:  07/11/19 153 lb 9.6 oz (69.7 kg)  07/07/18 156 lb 12.8 oz (71.1 kg)  03/12/17 164 lb (74.4 kg)       History of Present Illness: Becky Duran is a 86 y.o. female  who has had difficult to control BP.  BP readingsimproved after medication adjustment. Walking limited by back pain, chronically.Had temporary relief from stimulator placed in 2016.She received steroid injections as well at one point but they seemed to stop working. Has takenoxycodonefor pain along with acetaminophen.   When she gets a headache, BP tends to be high.  Back pain limitsherthe most. She only walks short distances. Water aerobics was painful for her.  Since the last visit, she was having BP spikes on amlodipiine 2.5 mg daily.  She went back to 5 mg daily with some improvement. Readings at night are high before bed time. She will wake up with a headache in the morning.    Some days she can have low BP readings. A few days ago, she had systolics in the 0000000.  Her appetite had been normal.   Denies : Chest pain. Dizziness. Leg edema. Nitroglycerin use. Orthopnea. Palpitations. Paroxysmal nocturnal dyspnea. Shortness of breath. Syncope.   Past Medical History:  Diagnosis Date  . Chronic low back pain   . CKD (chronic kidney disease), stage II   . Depression   . GERD (gastroesophageal reflux disease)   . Heart murmur   . Hiatal hernia   . History of endometriosis   . History of kidney stones   . Hyperlipidemia   . Hypertension cardiologist-  dr Irish Lack   pt states intermittant bp spike at night cause headache  . Hypothyroidism   . Narcotic drug use   . Nocturia   . Osteoarthritis   . Osteopenia   . Psoriasis   . Renal calculus, bilateral   . Scoliosis   . Wears contact  lenses    right eye only    Past Surgical History:  Procedure Laterality Date  . ABDOMINAL HYSTERECTOMY  age 67  . BREAST LUMPECTOMY  1990's    bengin  . CARDIAC CATHETERIZATION  01-23-2010  dr Irish Lack   no hemodynamically significant CAD/  normal LVF  . CARDIOVASCULAR STRESS TEST  08-16-2008  dr Irish Lack   normal nuclear perfusion study/  no ischemia/  normal LV function and wall motion, ef 94%  . CATARACT EXTRACTION W/ INTRAOCULAR LENS  IMPLANT, BILATERAL  2001  . colonscopy    . CYSTOSCOPY W/ URETERAL STENT REMOVAL N/A 06/21/2015   Procedure: CYSTOSCOPY WITH STENT REMOVAL;  Surgeon: Franchot Gallo, MD;  Location: [facility];  Service: Urology;  Laterality: N/A;  . CYSTOSCOPY WITH URETEROSCOPY AND STENT PLACEMENT Bilateral 06/21/2015   Procedure: CYSTOSCOPY WITH URETEROSCOPY AND  STENT PLACEMENT;  Surgeon: Franchot Gallo, MD;  Location: [facility];  Service: Urology;  Laterality: Bilateral;  . EXTRACORPOREAL SHOCK WAVE LITHOTRIPSY  03-09-2014  . HOLMIUM LASER APPLICATION N/A [id number]   Procedure: HOLMIUM LASER APPLICATION;  Surgeon: Franchot Gallo, MD;  Location: [facility];  Service: Urology;  Laterality: N/A;  . LIPOMA EXCISION  02-05-2011   Back  . LUMBAR LAMINECTOMY/DECOMPRESSION MICRODISCECTOMY  06/04/2011   Procedure: LUMBAR LAMINECTOMY/DECOMPRESSION MICRODISCECTOMY;  Surgeon:  Floyce Stakes, MD;  Location: [facility];  Service: Neurosurgery;  Laterality: N/A;  Lumbar Four-Five partial Lumbar Three Laminectomy  . PERCUTANEOUS NEPHROSTOLITHOTOMY Left 09-19-2009  . TOTAL KNEE ARTHROPLASTY Left 03/21/2014   Procedure: TOTAL KNEE ARTHROPLASTY;  Surgeon: Mauri Pole, MD;  Location: [facility];  Service: Orthopedics;  Laterality: Left;  left femoral nerve block  . TRANSTHORACIC ECHOCARDIOGRAM  12-28-2009   normal LVF, ef 60-65%/  mild AV sclerosis without stenosis/  mild AR, MR and TR/  trace PR     Current Outpatient  Medications  Medication Sig Dispense Refill  . acetaminophen (TYLENOL ARTHRITIS PAIN) 650 MG CR tablet Take 650 mg by mouth 2 (two) times daily.    Marland Kitchen amLODipine (NORVASC) 2.5 MG tablet Take 1 tablet (2.5 mg total) by mouth daily. 90 tablet 3  . aspirin 81 MG chewable tablet Chew 81 mg by mouth daily.     Marland Kitchen atorvastatin (LIPITOR) 20 MG tablet Take 20 mg by mouth every evening.     Marland Kitchen b complex vitamins tablet Take 1 tablet by mouth every morning.    . clarithromycin (BIAXIN) 500 MG tablet Take 500 mg as needed by mouth.    . DOCUSATE CALCIUM PO Take 2 capsules daily by mouth.    . famotidine (PEPCID) 20 MG tablet Take 20 mg by mouth daily.    Marland Kitchen FLUoxetine (PROZAC) 20 MG capsule Take 20 mg by mouth every morning.     Marland Kitchen levothyroxine (SYNTHROID, LEVOTHROID) 75 MCG tablet Take 75 mcg by mouth daily before breakfast.     . Liniments (BLUE-EMU SUPER STRENGTH) CREA Apply to back as needed for pain.    Marland Kitchen loratadine (CLARITIN) 10 MG tablet Take 10 mg by mouth daily as needed for allergies.     Marland Kitchen losartan (COZAAR) 50 MG tablet Take 50 mg by mouth every evening.     . Multiple Vitamin (MULTIVITAMIN WITH MINERALS) TABS tablet Take 1 tablet by mouth every morning.    . Oxycodone HCl 10 MG TABS Take 10 mg by mouth every 5 (five) hours as needed (pain).     Marland Kitchen oxyCODONE-acetaminophen (PERCOCET) 10-325 MG tablet Take 1 tablet by mouth every 4 (four) hours as needed for pain.    . Probiotic Product (PROBIOTIC DAILY PO) Take 1 tablet by mouth every morning.    . vitamin B-12 (CYANOCOBALAMIN) 1000 MCG tablet Take 1,000 mcg by mouth every morning.    . vitamin C (ASCORBIC ACID) 500 MG tablet Take 500 mg daily by mouth.     No current facility-administered medications for this visit.    Allergies:   Colace [docusate sodium], Etodolac, Penicillins, Relafen [nabumetone], Valacyclovir hcl, Diclofenac sodium, Ciprofloxacin, and Sulfa antibiotics    Social History:  The patient  reports that she has never smoked.  She has never used smokeless tobacco. She reports that she does not drink alcohol or use drugs.   Family History:  The patient's family history includes Anesthesia problems in her mother; Heart attack in her brother; Hypertension in her brother, mother, and sister; Stroke (age of onset: 55) in her maternal grandmother; Stroke (age of onset: 22) in her maternal grandfather.    ROS:  Please see the history of present illness.   Otherwise, review of systems are positive for high BP readings at night and first thing in AM; chronic back pain.   All other systems are reviewed and negative.    PHYSICAL EXAM: VS:  BP 114/68   Pulse 94  Ht 5\' 2"  (1.575 m)   Wt 153 lb 9.6 oz (69.7 kg)   SpO2 95%   BMI 28.09 kg/m  , BMI Body mass index is 28.09 kg/m. GEN: Well nourished, well developed, in no acute distress  HEENT: normal  Neck: no JVD, carotid bruits, or masses Cardiac: RRR; 2/6 systolic murmur, no rubs, or gallops,no edema  Respiratory:  clear to auscultation bilaterally, normal work of breathing GI: soft, nontender, nondistended, + BS MS: no deformity or atrophy  Skin: warm and dry, no rash Neuro:  Strength and sensation are intact Psych: euthymic mood, full affect   EKG:   The ekg ordered today demonstrates NSR, LBBB   Recent Labs: No results found for requested labs within last 8760 hours.   Lipid Panel    Component Value Date/Time   CHOL  12/26/2009 0335    167        ATP III CLASSIFICATION:  <200     mg/dL   Desirable  200-239  mg/dL   Borderline High  >=240    mg/dL   High          TRIG 68 12/26/2009 0335   HDL 56 12/26/2009 0335   CHOLHDL 3.0 12/26/2009 0335   VLDL 14 12/26/2009 0335   LDLCALC  12/26/2009 0335    97        Total Cholesterol/HDL:CHD Risk Coronary Heart Disease Risk Table                     Men   Women  1/2 Average Risk   3.4   3.3  Average Risk       5.0   4.4  2 X Average Risk   9.6   7.1  3 X Average Risk  23.4   11.0        Use the  calculated Patient Ratio above and the CHD Risk Table to determine the patient's CHD Risk.        ATP III CLASSIFICATION (LDL):  <100     mg/dL   Optimal  100-129  mg/dL   Near or Above                    Optimal  130-159  mg/dL   Borderline  160-189  mg/dL   High  >190     mg/dL   Very High     Other studies Reviewed: Additional studies/ records that were reviewed today with results demonstrating: PMD labs reviewed.   ASSESSMENT AND PLAN:  1. Hypertension: She Increased amlodipine dose to 5 mg a day.  She has also been taking losartan 100 mg a day.  Will change to amlodipine 5 mg BID; however, if her systolic blood pressure is less than 140 in the evening, she will hold off on the second amlodipine dose.  Low salt diet.  COntinue to check readings at home after relaxing for 5 minutes.  2.  Hyperlipidemia: LDL 76. COntinue atorvastatin. 3. Chronic back pain: This is limited her activity.  We suggested a stationary bike in the past.   Current medicines are reviewed at length with the patient today.  The patient concerns regarding her medicines were addressed.  The following changes have been made: Increase amlodipine  Labs/ tests ordered today include:  No orders of the defined types were placed in this encounter.   Recommend 150 minutes/week of aerobic exercise Low fat, low carb, high fiber diet recommended  Disposition:   FU in  1 year - send [place] some BP readings   Signed, Larae Grooms, MD  07/11/2019 1:51 PM    [facility] [address], [place], [place]  [address] Phone: [phone]; Fax: [phone]

## 2019-07-11 ENCOUNTER — Ambulatory Visit (INDEPENDENT_AMBULATORY_CARE_PROVIDER_SITE_OTHER): Payer: Medicare Other | Admitting: Interventional Cardiology

## 2019-07-11 ENCOUNTER — Other Ambulatory Visit: Payer: Self-pay

## 2019-07-11 ENCOUNTER — Encounter: Payer: Self-pay | Admitting: Interventional Cardiology

## 2019-07-11 VITALS — BP 114/68 | HR 94 | Ht 62.0 in | Wt 153.6 lb

## 2019-07-11 DIAGNOSIS — M549 Dorsalgia, unspecified: Secondary | ICD-10-CM | POA: Diagnosis not present

## 2019-07-11 DIAGNOSIS — E782 Mixed hyperlipidemia: Secondary | ICD-10-CM | POA: Diagnosis not present

## 2019-07-11 DIAGNOSIS — I10 Essential (primary) hypertension: Secondary | ICD-10-CM | POA: Diagnosis not present

## 2019-07-11 DIAGNOSIS — G8929 Other chronic pain: Secondary | ICD-10-CM

## 2019-07-11 MED ORDER — AMLODIPINE BESYLATE 5 MG PO TABS: 5.0000 mg | ORAL_TABLET | Freq: Two times a day (BID) | ORAL | 3 refills | Status: DC

## 2019-07-11 NOTE — Patient Instructions (Signed)
Medication Instructions:  Your physician has recommended you make the following change in your medication:   INCREASE: amlodipine to 5 mg twice a day  If your Systolic Blood Pressure (top number) is less than 140 in the afternoon, you can skip the PM dose  *If you need a refill on your cardiac medications before your next appointment, please call your pharmacy*   Lab Work: None ordered  If you have labs (blood work) drawn today and your tests are completely normal, you will receive your results only by: Marland Kitchen MyChart Message (if you have MyChart) OR . A paper copy in the mail If you have any lab test that is abnormal or we need to change your treatment, we will call you to review the results.   Testing/Procedures: None ordered   Follow-Up: At [facility], you and your health needs are our priority.  As part of our continuing mission to provide you with exceptional heart care, we have created designated Provider Care Teams.  These Care Teams include your primary Cardiologist (physician) and Advanced Practice Providers (APPs -  Physician Assistants and Nurse Practitioners) who all work together to provide you with the care you need, when you need it.  We recommend signing up for the patient portal called "MyChart".  Sign up information is provided on this After Visit Summary.  MyChart is used to connect with patients for Virtual Visits (Telemedicine).  Patients are able to view lab/test results, encounter notes, upcoming appointments, etc.  Non-urgent messages can be sent to your provider as well.   To learn more about what you can do with MyChart, go to [email or web address].    Your next appointment:   12 month(s)  The format for your next appointment:   In Person  Provider:   You may see Larae Grooms, MD or one of the following Advanced Practice Providers on your designated Care Team:    Melina Copa, PA-C  Ermalinda Barrios, PA-C    Other Instructions

## 2019-07-21 DIAGNOSIS — Z96652 Presence of left artificial knee joint: Secondary | ICD-10-CM | POA: Diagnosis not present

## 2019-07-21 DIAGNOSIS — M25562 Pain in left knee: Secondary | ICD-10-CM | POA: Diagnosis not present

## 2019-07-29 ENCOUNTER — Telehealth: Payer: Self-pay | Admitting: Interventional Cardiology

## 2019-07-29 NOTE — Telephone Encounter (Signed)
Pt c/o BP issue: STAT if pt c/o blurred vision, one-sided weakness or slurred speech  1. What are your last 5 BP readings?  07/29/19 138/83  07/28/19 78/48 AM 168/82 PM 182/112 PM (doesn't know date) 120/77 AM (doesn't know date)  2. Are you having any other symptoms (ex. Dizziness, headache, blurred vision, passed out)? Headache & Lightheadedness   3. What is your BP issue? Becky Duran is calling to report her BP readings over the past month per Dr. Hassell Done request. She states it has no steady rhythm, it's either really high or really low depending on the time of day. It tends to run low during the day and gets high at night. She states when it gets low during the day it causes her to get lightheaded and when it runs high at night it wakes her up with a headache. When she takes medication for it being to high at night she states when she wakes up the next day it'll run low through out the whole day. Please advise.

## 2019-07-29 NOTE — Telephone Encounter (Signed)
Returned call to patient. She states that her BP has still been fluctuating. She states that she has had episodes of SBP in the 70s and she gets very lightheaded and dizzy. She also states that she has spikes in her BP as high as 182/112 that wakes her up at night with a HA. She has been avoiding salt in her diet. Patient's BP at this time is 110/78 and she is asymptomatic at this time.   Patient takes:  Losartan 100 mg at 7 PM Amlodipine 5 mg BID 10 AM and 10 PM (if SBP >140)  She states that her BP normally runs:  AM when she gets up 148/82  Lunch time 127/78  Dinner 148-160s/70-80s  Bedtime 140/80   When patient has had SBP as low as 70s it is usually around around lunch time. When she has her episodes of elevated BP it is usually around 4:30-6:00 AM. Made patient aware that I will forward the information to Dr. Irish Lack for review and recommendation.

## 2019-08-01 DIAGNOSIS — E7800 Pure hypercholesterolemia, unspecified: Secondary | ICD-10-CM | POA: Diagnosis not present

## 2019-08-01 DIAGNOSIS — E039 Hypothyroidism, unspecified: Secondary | ICD-10-CM | POA: Diagnosis not present

## 2019-08-01 DIAGNOSIS — M179 Osteoarthritis of knee, unspecified: Secondary | ICD-10-CM | POA: Diagnosis not present

## 2019-08-01 DIAGNOSIS — I10 Essential (primary) hypertension: Secondary | ICD-10-CM | POA: Diagnosis not present

## 2019-08-01 DIAGNOSIS — F324 Major depressive disorder, single episode, in partial remission: Secondary | ICD-10-CM | POA: Diagnosis not present

## 2019-08-02 NOTE — Telephone Encounter (Signed)
Called and made patient aware of recommendations below. 

## 2019-08-02 NOTE — Telephone Encounter (Signed)
Would have her take the amlodipine 5 mg at night regularly, and then use then take additional amlodipine 5 mg in AM only if SBP is > 140.  Just switching the doses of amlodipine to help with early AM BP spikes.   JV

## 2019-08-03 DIAGNOSIS — I10 Essential (primary) hypertension: Secondary | ICD-10-CM | POA: Diagnosis not present

## 2019-08-15 DIAGNOSIS — H43813 Vitreous degeneration, bilateral: Secondary | ICD-10-CM | POA: Diagnosis not present

## 2019-08-15 DIAGNOSIS — H353131 Nonexudative age-related macular degeneration, bilateral, early dry stage: Secondary | ICD-10-CM | POA: Diagnosis not present

## 2019-08-15 DIAGNOSIS — Z961 Presence of intraocular lens: Secondary | ICD-10-CM | POA: Diagnosis not present

## 2019-08-15 DIAGNOSIS — H40023 Open angle with borderline findings, high risk, bilateral: Secondary | ICD-10-CM | POA: Diagnosis not present

## 2019-09-02 DIAGNOSIS — I10 Essential (primary) hypertension: Secondary | ICD-10-CM | POA: Diagnosis not present

## 2019-09-26 DIAGNOSIS — H40023 Open angle with borderline findings, high risk, bilateral: Secondary | ICD-10-CM | POA: Diagnosis not present

## 2019-09-26 DIAGNOSIS — H04123 Dry eye syndrome of bilateral lacrimal glands: Secondary | ICD-10-CM | POA: Diagnosis not present

## 2019-09-26 DIAGNOSIS — H353131 Nonexudative age-related macular degeneration, bilateral, early dry stage: Secondary | ICD-10-CM | POA: Diagnosis not present

## 2019-09-28 DIAGNOSIS — I10 Essential (primary) hypertension: Secondary | ICD-10-CM | POA: Diagnosis not present

## 2019-11-03 DIAGNOSIS — Z79899 Other long term (current) drug therapy: Secondary | ICD-10-CM | POA: Diagnosis not present

## 2019-11-03 DIAGNOSIS — Z5181 Encounter for therapeutic drug level monitoring: Secondary | ICD-10-CM | POA: Diagnosis not present

## 2019-11-25 DIAGNOSIS — I10 Essential (primary) hypertension: Secondary | ICD-10-CM | POA: Diagnosis not present

## 2019-11-25 DIAGNOSIS — E039 Hypothyroidism, unspecified: Secondary | ICD-10-CM | POA: Diagnosis not present

## 2019-11-25 DIAGNOSIS — E7800 Pure hypercholesterolemia, unspecified: Secondary | ICD-10-CM | POA: Diagnosis not present

## 2019-11-25 DIAGNOSIS — F324 Major depressive disorder, single episode, in partial remission: Secondary | ICD-10-CM | POA: Diagnosis not present

## 2019-11-25 DIAGNOSIS — M179 Osteoarthritis of knee, unspecified: Secondary | ICD-10-CM | POA: Diagnosis not present

## 2019-12-22 DIAGNOSIS — I10 Essential (primary) hypertension: Secondary | ICD-10-CM | POA: Diagnosis not present

## 2019-12-22 DIAGNOSIS — E7800 Pure hypercholesterolemia, unspecified: Secondary | ICD-10-CM | POA: Diagnosis not present

## 2019-12-22 DIAGNOSIS — M810 Age-related osteoporosis without current pathological fracture: Secondary | ICD-10-CM | POA: Diagnosis not present

## 2019-12-22 DIAGNOSIS — E039 Hypothyroidism, unspecified: Secondary | ICD-10-CM | POA: Diagnosis not present

## 2019-12-22 DIAGNOSIS — Z0000 Encounter for general adult medical examination without abnormal findings: Secondary | ICD-10-CM | POA: Diagnosis not present

## 2019-12-22 DIAGNOSIS — E538 Deficiency of other specified B group vitamins: Secondary | ICD-10-CM | POA: Diagnosis not present

## 2019-12-26 DIAGNOSIS — H40023 Open angle with borderline findings, high risk, bilateral: Secondary | ICD-10-CM | POA: Diagnosis not present

## 2020-01-25 DIAGNOSIS — I10 Essential (primary) hypertension: Secondary | ICD-10-CM | POA: Diagnosis not present

## 2020-01-25 DIAGNOSIS — Z23 Encounter for immunization: Secondary | ICD-10-CM | POA: Diagnosis not present

## 2020-02-01 DIAGNOSIS — M810 Age-related osteoporosis without current pathological fracture: Secondary | ICD-10-CM | POA: Diagnosis not present

## 2020-02-01 DIAGNOSIS — F324 Major depressive disorder, single episode, in partial remission: Secondary | ICD-10-CM | POA: Diagnosis not present

## 2020-02-01 DIAGNOSIS — E7800 Pure hypercholesterolemia, unspecified: Secondary | ICD-10-CM | POA: Diagnosis not present

## 2020-02-01 DIAGNOSIS — I10 Essential (primary) hypertension: Secondary | ICD-10-CM | POA: Diagnosis not present

## 2020-02-01 DIAGNOSIS — E039 Hypothyroidism, unspecified: Secondary | ICD-10-CM | POA: Diagnosis not present

## 2020-02-01 DIAGNOSIS — M179 Osteoarthritis of knee, unspecified: Secondary | ICD-10-CM | POA: Diagnosis not present

## 2020-02-17 DIAGNOSIS — F324 Major depressive disorder, single episode, in partial remission: Secondary | ICD-10-CM | POA: Diagnosis not present

## 2020-02-17 DIAGNOSIS — E039 Hypothyroidism, unspecified: Secondary | ICD-10-CM | POA: Diagnosis not present

## 2020-02-17 DIAGNOSIS — E7800 Pure hypercholesterolemia, unspecified: Secondary | ICD-10-CM | POA: Diagnosis not present

## 2020-02-17 DIAGNOSIS — I10 Essential (primary) hypertension: Secondary | ICD-10-CM | POA: Diagnosis not present

## 2020-02-17 DIAGNOSIS — M179 Osteoarthritis of knee, unspecified: Secondary | ICD-10-CM | POA: Diagnosis not present

## 2020-02-17 DIAGNOSIS — M810 Age-related osteoporosis without current pathological fracture: Secondary | ICD-10-CM | POA: Diagnosis not present

## 2020-02-23 DIAGNOSIS — I10 Essential (primary) hypertension: Secondary | ICD-10-CM | POA: Diagnosis not present

## 2020-02-24 DIAGNOSIS — I10 Essential (primary) hypertension: Secondary | ICD-10-CM | POA: Diagnosis not present

## 2020-03-05 DIAGNOSIS — G894 Chronic pain syndrome: Secondary | ICD-10-CM | POA: Diagnosis not present

## 2020-03-15 DIAGNOSIS — I10 Essential (primary) hypertension: Secondary | ICD-10-CM | POA: Diagnosis not present

## 2020-03-23 DIAGNOSIS — I10 Essential (primary) hypertension: Secondary | ICD-10-CM | POA: Diagnosis not present

## 2020-03-28 DIAGNOSIS — I10 Essential (primary) hypertension: Secondary | ICD-10-CM | POA: Diagnosis not present

## 2020-03-30 DIAGNOSIS — Z23 Encounter for immunization: Secondary | ICD-10-CM | POA: Diagnosis not present

## 2020-04-11 DIAGNOSIS — H40023 Open angle with borderline findings, high risk, bilateral: Secondary | ICD-10-CM | POA: Diagnosis not present

## 2020-04-11 DIAGNOSIS — Z961 Presence of intraocular lens: Secondary | ICD-10-CM | POA: Diagnosis not present

## 2020-04-11 DIAGNOSIS — H04123 Dry eye syndrome of bilateral lacrimal glands: Secondary | ICD-10-CM | POA: Diagnosis not present

## 2020-04-16 DIAGNOSIS — D485 Neoplasm of uncertain behavior of skin: Secondary | ICD-10-CM | POA: Diagnosis not present

## 2020-04-16 DIAGNOSIS — C44319 Basal cell carcinoma of skin of other parts of face: Secondary | ICD-10-CM | POA: Diagnosis not present

## 2020-04-16 DIAGNOSIS — L308 Other specified dermatitis: Secondary | ICD-10-CM | POA: Diagnosis not present

## 2020-04-24 DIAGNOSIS — I10 Essential (primary) hypertension: Secondary | ICD-10-CM | POA: Diagnosis not present

## 2020-05-04 DIAGNOSIS — I10 Essential (primary) hypertension: Secondary | ICD-10-CM | POA: Diagnosis not present

## 2020-05-14 DIAGNOSIS — E039 Hypothyroidism, unspecified: Secondary | ICD-10-CM | POA: Diagnosis not present

## 2020-05-14 DIAGNOSIS — M179 Osteoarthritis of knee, unspecified: Secondary | ICD-10-CM | POA: Diagnosis not present

## 2020-05-14 DIAGNOSIS — K219 Gastro-esophageal reflux disease without esophagitis: Secondary | ICD-10-CM | POA: Diagnosis not present

## 2020-05-14 DIAGNOSIS — I10 Essential (primary) hypertension: Secondary | ICD-10-CM | POA: Diagnosis not present

## 2020-05-14 DIAGNOSIS — F324 Major depressive disorder, single episode, in partial remission: Secondary | ICD-10-CM | POA: Diagnosis not present

## 2020-05-14 DIAGNOSIS — E7800 Pure hypercholesterolemia, unspecified: Secondary | ICD-10-CM | POA: Diagnosis not present

## 2020-05-14 DIAGNOSIS — M810 Age-related osteoporosis without current pathological fracture: Secondary | ICD-10-CM | POA: Diagnosis not present

## 2020-05-30 DIAGNOSIS — I10 Essential (primary) hypertension: Secondary | ICD-10-CM | POA: Diagnosis not present

## 2020-05-30 DIAGNOSIS — H40023 Open angle with borderline findings, high risk, bilateral: Secondary | ICD-10-CM | POA: Diagnosis not present

## 2020-05-30 DIAGNOSIS — Z961 Presence of intraocular lens: Secondary | ICD-10-CM | POA: Diagnosis not present

## 2020-05-30 DIAGNOSIS — H43813 Vitreous degeneration, bilateral: Secondary | ICD-10-CM | POA: Diagnosis not present

## 2020-05-30 DIAGNOSIS — H353131 Nonexudative age-related macular degeneration, bilateral, early dry stage: Secondary | ICD-10-CM | POA: Diagnosis not present

## 2020-05-31 DIAGNOSIS — C44319 Basal cell carcinoma of skin of other parts of face: Secondary | ICD-10-CM | POA: Diagnosis not present

## 2020-06-04 DIAGNOSIS — I10 Essential (primary) hypertension: Secondary | ICD-10-CM | POA: Diagnosis not present

## 2020-06-26 DIAGNOSIS — F324 Major depressive disorder, single episode, in partial remission: Secondary | ICD-10-CM | POA: Diagnosis not present

## 2020-06-26 DIAGNOSIS — F419 Anxiety disorder, unspecified: Secondary | ICD-10-CM | POA: Diagnosis not present

## 2020-06-26 DIAGNOSIS — M4716 Other spondylosis with myelopathy, lumbar region: Secondary | ICD-10-CM | POA: Diagnosis not present

## 2020-06-26 DIAGNOSIS — E039 Hypothyroidism, unspecified: Secondary | ICD-10-CM | POA: Diagnosis not present

## 2020-06-26 DIAGNOSIS — I10 Essential (primary) hypertension: Secondary | ICD-10-CM | POA: Diagnosis not present

## 2020-06-26 DIAGNOSIS — K219 Gastro-esophageal reflux disease without esophagitis: Secondary | ICD-10-CM | POA: Diagnosis not present

## 2020-06-26 DIAGNOSIS — E7800 Pure hypercholesterolemia, unspecified: Secondary | ICD-10-CM | POA: Diagnosis not present

## 2020-07-02 DIAGNOSIS — M7062 Trochanteric bursitis, left hip: Secondary | ICD-10-CM | POA: Diagnosis not present

## 2020-07-05 DIAGNOSIS — L84 Corns and callosities: Secondary | ICD-10-CM | POA: Diagnosis not present

## 2020-07-05 DIAGNOSIS — L249 Irritant contact dermatitis, unspecified cause: Secondary | ICD-10-CM | POA: Diagnosis not present

## 2020-07-05 DIAGNOSIS — C44319 Basal cell carcinoma of skin of other parts of face: Secondary | ICD-10-CM | POA: Diagnosis not present

## 2020-07-08 NOTE — Progress Notes (Unsigned)
Cardiology Office Note   Date:  07/09/2020   ID:  Becky Duran, DOB 1933/02/25, MRN [id number]  PCP:  [name], MD    No chief complaint on file.  HTN  Wt Readings from Last 3 Encounters:  07/09/20 150 lb 3.2 oz (68.1 kg)  07/11/19 153 lb 9.6 oz (69.7 kg)  07/07/18 156 lb 12.8 oz (71.1 kg)       History of Present Illness: Becky Duran is a 87 y.o. female  who has had difficult to control BP.  BP readingsimproved after medication adjustment. Walking limited by back pain, chronically.Had temporary relief from stimulator placed in 2016.She received steroid injections as well at one point but they seemed to stop working. Has takenoxycodonefor pain along with acetaminophen.   When she gets a headache, BP tends to be high.  Back pain limitsherthe most. She only walks short distances. Water aerobics was painful for her.  Since the last visit, she was having BP spikes on amlodipiine 2.5 mg daily.  She went back to 5 mg daily with some improvement. Readings at night are high before bed time. She will wake up with a headache in the morning.    Some days she can have low BP readings. In 5631, she had systolics in the 49F despite her appetite being normal.   Of late, her BP has been lower.  87 systolic a few days ago and she was dizzy.  Low readings tend to be early afternoon.   Highest readings was in the 160s last week.  Most readings are in the 140s  GERD has limited her diet.  She limits salt for the most part, but occasional soup.     Past Medical History:  Diagnosis Date  . Chronic low back pain   . CKD (chronic kidney disease), stage II   . Depression   . GERD (gastroesophageal reflux disease)   . Heart murmur   . Hiatal hernia   . History of endometriosis   . History of kidney stones   . Hyperlipidemia   . Hypertension cardiologist-  dr Irish Lack   pt states intermittant bp spike at night cause headache  . Hypothyroidism   . Narcotic  drug use   . Nocturia   . Osteoarthritis   . Osteopenia   . Psoriasis   . Renal calculus, bilateral   . Scoliosis   . Wears contact lenses    right eye only    Past Surgical History:  Procedure Laterality Date  . ABDOMINAL HYSTERECTOMY  age 21  . BREAST LUMPECTOMY  1990's    bengin  . CARDIAC CATHETERIZATION  01-23-2010  dr Irish Lack   no hemodynamically significant CAD/  normal LVF  . CARDIOVASCULAR STRESS TEST  08-16-2008  dr Irish Lack   normal nuclear perfusion study/  no ischemia/  normal LV function and wall motion, ef 94%  . CATARACT EXTRACTION W/ INTRAOCULAR LENS  IMPLANT, BILATERAL  2001  . colonscopy    . CYSTOSCOPY W/ URETERAL STENT REMOVAL N/A 06/21/2015   Procedure: CYSTOSCOPY WITH STENT REMOVAL;  Surgeon: Franchot Gallo, MD;  Location: [facility];  Service: Urology;  Laterality: N/A;  . CYSTOSCOPY WITH URETEROSCOPY AND STENT PLACEMENT Bilateral 06/21/2015   Procedure: CYSTOSCOPY WITH URETEROSCOPY AND  STENT PLACEMENT;  Surgeon: Franchot Gallo, MD;  [facility];  Service: Urology;  Laterality: Bilateral;  . EXTRACORPOREAL SHOCK WAVE LITHOTRIPSY  03-09-2014  . HOLMIUM LASER APPLICATION N/A 0/26/3785   Procedure: HOLMIUM  LASER APPLICATION;  Surgeon: Franchot Gallo, MD;  Location: [facility];  Service: Urology;  Laterality: N/A;  . LIPOMA EXCISION  02-05-2011   Back  . LUMBAR LAMINECTOMY/DECOMPRESSION MICRODISCECTOMY  06/04/2011   Procedure: LUMBAR LAMINECTOMY/DECOMPRESSION MICRODISCECTOMY;  Surgeon: Floyce Stakes, MD;  Location: [place] [facility];  Service: Neurosurgery;  Laterality: N/A;  Lumbar Four-Five partial Lumbar Three Laminectomy  . PERCUTANEOUS NEPHROSTOLITHOTOMY Left 09-19-2009  . TOTAL KNEE ARTHROPLASTY Left 03/21/2014   Procedure: TOTAL KNEE ARTHROPLASTY;  Surgeon: Mauri Pole, MD;  Location: [facility];  Service: Orthopedics;  Laterality: Left;  left femoral nerve block  . TRANSTHORACIC  ECHOCARDIOGRAM  12-28-2009   normal LVF, ef 60-65%/  mild AV sclerosis without stenosis/  mild AR, MR and TR/  trace PR     Current Outpatient Medications  Medication Sig Dispense Refill  . acetaminophen (TYLENOL) 650 MG CR tablet Take 650 mg by mouth 2 (two) times daily.    Marland Kitchen amLODipine (NORVASC) 5 MG tablet Take 1 tablet (5 mg total) by mouth in the morning and at bedtime. 180 tablet 3  . aspirin 81 MG chewable tablet Chew 81 mg by mouth daily.     Marland Kitchen atorvastatin (LIPITOR) 20 MG tablet Take 20 mg by mouth every evening.    Marland Kitchen b complex vitamins tablet Take 1 tablet by mouth every morning.    . clarithromycin (BIAXIN) 500 MG tablet Take 500 mg as needed by mouth.    . DOCUSATE CALCIUM PO Take 2 capsules daily by mouth.    . famotidine (PEPCID) 20 MG tablet Take 20 mg by mouth daily.    Marland Kitchen FLUoxetine (PROZAC) 20 MG capsule Take 20 mg by mouth every morning.    Marland Kitchen levothyroxine (SYNTHROID, LEVOTHROID) 75 MCG tablet Take 75 mcg by mouth daily before breakfast.    . Liniments (BLUE-EMU SUPER STRENGTH) CREA Apply to back as needed for pain.    Marland Kitchen loratadine (CLARITIN) 10 MG tablet Take 10 mg by mouth daily as needed for allergies.     Marland Kitchen losartan (COZAAR) 50 MG tablet Take 100 mg by mouth daily.    . Multiple Vitamin (MULTIVITAMIN WITH MINERALS) TABS tablet Take 1 tablet by mouth every morning.    . Oxycodone HCl 10 MG TABS Take 10 mg by mouth every 5 (five) hours as needed (pain).     . Probiotic Product (PROBIOTIC DAILY PO) Take 1 tablet by mouth every morning.    . vitamin B-12 (CYANOCOBALAMIN) 1000 MCG tablet Take 1,000 mcg by mouth every morning.    . vitamin C (ASCORBIC ACID) 500 MG tablet Take 500 mg daily by mouth.     No current facility-administered medications for this visit.    Allergies:   Colace [docusate sodium], Etodolac, Penicillins, Relafen [nabumetone], Valacyclovir hcl, Diclofenac sodium, Ciprofloxacin, and Sulfa antibiotics    Social History:  The patient  reports that she  has never smoked. She has never used smokeless tobacco. She reports that she does not drink alcohol and does not use drugs.   Family History:  The patient's family history includes Anesthesia problems in her mother; Heart attack in her brother; Hypertension in her brother, mother, and sister; Stroke (age of onset: 70) in her maternal grandmother; Stroke (age of onset: 85) in her maternal grandfather.    ROS:  Please see the history of present illness.   Otherwise, review of systems are positive for dizziness with low readings.   All other systems are reviewed and negative.  PHYSICAL EXAM: VS:  BP 98/68   Pulse 82   Ht 5\' 2"  (1.575 m)   Wt 150 lb 3.2 oz (68.1 kg)   SpO2 95%   BMI 27.47 kg/m  , BMI Body mass index is 27.47 kg/m. GEN: Well nourished, well developed, in no acute distress  HEENT: normal  Neck: no JVD, carotid bruits, or masses Cardiac: RRR; no murmurs, rubs, or gallops,no edema  Respiratory:  clear to auscultation bilaterally, normal work of breathing GI: soft, nontender, nondistended, + BS MS: no deformity or atrophy ; 2+ PT and 2+ DP pulses bilaterally.  Skin: warm and dry, no rash Neuro:  Strength and sensation are intact Psych: euthymic mood, full affect   EKG:   The ekg ordered today demonstrates NSR, LBBB   Recent Labs: No results found for requested labs within last 8760 hours.   Lipid Panel    Component Value Date/Time   CHOL  12/26/2009 0335    167        ATP III CLASSIFICATION:  <200     mg/dL   Desirable  200-239  mg/dL   Borderline High  >=240    mg/dL   High          TRIG 68 12/26/2009 0335   HDL 56 12/26/2009 0335   CHOLHDL 3.0 12/26/2009 0335   VLDL 14 12/26/2009 0335   LDLCALC  12/26/2009 0335    97        Total Cholesterol/HDL:CHD Risk Coronary Heart Disease Risk Table                     Men   Women  1/2 Average Risk   3.4   3.3  Average Risk       5.0   4.4  2 X Average Risk   9.6   7.1  3 X Average Risk  23.4   11.0         Use the calculated Patient Ratio above and the CHD Risk Table to determine the patient's CHD Risk.        ATP III CLASSIFICATION (LDL):  <100     mg/dL   Optimal  100-129  mg/dL   Near or Above                    Optimal  130-159  mg/dL   Borderline  160-189  mg/dL   High  >190     mg/dL   Very High     Other studies Reviewed: Additional studies/ records that were reviewed today with results demonstrating: labs reviewed    ASSESSMENT AND PLAN:  1. HTN: Some low readings.  Change losartan to 50 mg BID.  Hopefully spreading out th emedicine will prevent big peaks and valleys in the BP.  Continue norvasc 5 mg BID prn depending on how her BP does.  Once we have more readings, hopefully, we can finalize the dose of the amlodipine so that it is no longer prn.  She will send [place] some readings.  2. Hyperlipidemia: LDL 83 in 2021. Continue lipid lowering therapy.  3. Chronic back pain: Exercise limited by back pain.  Avoid falling.     Current medicines are reviewed at length with the patient today.  The patient concerns regarding her medicines were addressed.  The following changes have been made:  No change  Labs/ tests ordered today include:  No orders of the defined types were placed in this encounter.  Recommend 150 minutes/week of aerobic exercise Low fat, low carb, high fiber diet recommended  Disposition:   FU in 1 year   Signed, Larae Grooms, MD  07/09/2020 4:29 PM    [facility] [place], [place], [place]  [address] Phone: [phone]; Fax: [phone]

## 2020-07-09 ENCOUNTER — Other Ambulatory Visit: Payer: Self-pay

## 2020-07-09 ENCOUNTER — Ambulatory Visit (INDEPENDENT_AMBULATORY_CARE_PROVIDER_SITE_OTHER): Payer: Medicare Other | Admitting: Interventional Cardiology

## 2020-07-09 ENCOUNTER — Encounter: Payer: Self-pay | Admitting: Interventional Cardiology

## 2020-07-09 VITALS — BP 98/68 | HR 82 | Ht 62.0 in | Wt 150.2 lb

## 2020-07-09 DIAGNOSIS — I10 Essential (primary) hypertension: Secondary | ICD-10-CM | POA: Diagnosis not present

## 2020-07-09 DIAGNOSIS — G8929 Other chronic pain: Secondary | ICD-10-CM

## 2020-07-09 DIAGNOSIS — E782 Mixed hyperlipidemia: Secondary | ICD-10-CM | POA: Diagnosis not present

## 2020-07-09 DIAGNOSIS — M549 Dorsalgia, unspecified: Secondary | ICD-10-CM

## 2020-07-09 MED ORDER — AMLODIPINE BESYLATE 5 MG PO TABS: ORAL_TABLET | ORAL | 6 refills | Status: DC

## 2020-07-09 MED ORDER — LOSARTAN POTASSIUM 50 MG PO TABS: 50.0000 mg | ORAL_TABLET | Freq: Two times a day (BID) | ORAL | 3 refills | Status: AC

## 2020-07-09 NOTE — Patient Instructions (Signed)
Medication Instructions:  Your physician has recommended you make the following change in your medication:   Change losartan to 50 mg by mouth twice daily. Continue taking amlodipine 5 mg twice daily as needed  *If you need a refill on your cardiac medications before your next appointment, please call your pharmacy*   Lab Work: none If you have labs (blood work) drawn today and your tests are completely normal, you will receive your results only by: Marland Kitchen MyChart Message (if you have MyChart) OR . A paper copy in the mail If you have any lab test that is abnormal or we need to change your treatment, we will call you to review the results.   Testing/Procedures: none   Follow-Up: At [facility], you and your health needs are our priority.  As part of our continuing mission to provide you with exceptional heart care, we have created designated Provider Care Teams.  These Care Teams include your primary Cardiologist (physician) and Advanced Practice Providers (APPs -  Physician Assistants and Nurse Practitioners) who all work together to provide you with the care you need, when you need it.  We recommend signing up for the patient portal called "MyChart".  Sign up information is provided on this After Visit Summary.  MyChart is used to connect with patients for Virtual Visits (Telemedicine).  Patients are able to view lab/test results, encounter notes, upcoming appointments, etc.  Non-urgent messages can be sent to your provider as well.   To learn more about what you can do with MyChart, go to [email or web address].    Your next appointment:   12 month(s)  The format for your next appointment:   In Person  Provider:   You may see Larae Grooms, MD or one of the following Advanced Practice Providers on your designated Care Team:    Melina Copa, PA-C  Ermalinda Barrios, PA-C    Other Instructions

## 2020-07-10 DIAGNOSIS — G894 Chronic pain syndrome: Secondary | ICD-10-CM | POA: Diagnosis not present

## 2020-08-02 ENCOUNTER — Telehealth: Payer: Self-pay | Admitting: *Deleted

## 2020-08-02 NOTE — Telephone Encounter (Signed)
Received fax from [facility] stating their records indicate patient has received Irbesartan and losartan.  I spoke with patient who reports Dr Kenton Kingfisher has prescribed Irbesartan in the past but she is no longer taking.  Is taking Losartan as prescribed by Dr Irish Lack.  She reports she has a BP cuff that automatically sends her BP readings to a provider at Dr Doreene Adas office. She will contact the provider and ask that the readings be sent to Dr Irish Lack.

## 2020-08-08 DIAGNOSIS — K219 Gastro-esophageal reflux disease without esophagitis: Secondary | ICD-10-CM | POA: Diagnosis not present

## 2020-08-08 DIAGNOSIS — F324 Major depressive disorder, single episode, in partial remission: Secondary | ICD-10-CM | POA: Diagnosis not present

## 2020-08-08 DIAGNOSIS — E039 Hypothyroidism, unspecified: Secondary | ICD-10-CM | POA: Diagnosis not present

## 2020-08-08 DIAGNOSIS — M810 Age-related osteoporosis without current pathological fracture: Secondary | ICD-10-CM | POA: Diagnosis not present

## 2020-08-08 DIAGNOSIS — I10 Essential (primary) hypertension: Secondary | ICD-10-CM | POA: Diagnosis not present

## 2020-08-08 DIAGNOSIS — M179 Osteoarthritis of knee, unspecified: Secondary | ICD-10-CM | POA: Diagnosis not present

## 2020-08-08 DIAGNOSIS — E7800 Pure hypercholesterolemia, unspecified: Secondary | ICD-10-CM | POA: Diagnosis not present

## 2020-08-22 DIAGNOSIS — R413 Other amnesia: Secondary | ICD-10-CM | POA: Diagnosis not present

## 2020-08-22 DIAGNOSIS — I10 Essential (primary) hypertension: Secondary | ICD-10-CM | POA: Diagnosis not present

## 2020-08-22 DIAGNOSIS — E7800 Pure hypercholesterolemia, unspecified: Secondary | ICD-10-CM | POA: Diagnosis not present

## 2020-08-29 DIAGNOSIS — H04123 Dry eye syndrome of bilateral lacrimal glands: Secondary | ICD-10-CM | POA: Diagnosis not present

## 2020-08-29 DIAGNOSIS — H40023 Open angle with borderline findings, high risk, bilateral: Secondary | ICD-10-CM | POA: Diagnosis not present

## 2020-08-30 ENCOUNTER — Other Ambulatory Visit: Payer: Self-pay

## 2020-08-30 ENCOUNTER — Other Ambulatory Visit: Payer: Self-pay | Admitting: Family Medicine

## 2020-08-30 ENCOUNTER — Ambulatory Visit
Admission: RE | Admit: 2020-08-30 | Discharge: 2020-08-30 | Disposition: A | Discharge status: Home / Self Care | Payer: Medicare Other | Source: Ambulatory Visit | Attending: Family Medicine | Admitting: Family Medicine

## 2020-08-30 DIAGNOSIS — M542 Cervicalgia: Secondary | ICD-10-CM

## 2020-08-30 DIAGNOSIS — I10 Essential (primary) hypertension: Secondary | ICD-10-CM | POA: Diagnosis not present

## 2020-08-31 ENCOUNTER — Telehealth: Payer: Self-pay | Admitting: Interventional Cardiology

## 2020-08-31 NOTE — Telephone Encounter (Signed)
I spoke with patient's husband.  He reports patient had been having memory issues for last 2 weeks.  No speech difficulty, no weakness or facial droop.  Has headache in the back of her neck at times.  Not daily.  Headache this AM is improving some.  Saw Dr Kenton Kingfisher yesterday regarding these issues.  X ray of neck and spine done.  Blood pressures running 120-130 in the AM. Running 110-130 during the day.  Only goes above 140 on occasion in the evenings.  Last night it did not go above 140.  Patient takes prn amlodipine if BP elevated.  Husband will follow up with Dr Kenton Kingfisher regarding possible brain scan.  Stroke symptoms discussed with husband and he is aware to call 911 if patient developed any of these symptoms. I told patient's husband I would let Dr Irish Lack know about recent changes.

## 2020-08-31 NOTE — Telephone Encounter (Signed)
Patient's husband states the patient thinks she has had mini strokes. He states she is losing her short term memory and can't remember things she is told 5 minutes before. He states he thinks she may need a brain scan. He states she had a severe headache in the back of her neck this morning and recently had an x ray at the back of  The neck and spine, but have not gotten the results yet.

## 2020-09-07 DIAGNOSIS — M542 Cervicalgia: Secondary | ICD-10-CM | POA: Diagnosis not present

## 2020-09-07 DIAGNOSIS — M549 Dorsalgia, unspecified: Secondary | ICD-10-CM | POA: Diagnosis not present

## 2020-09-07 DIAGNOSIS — R413 Other amnesia: Secondary | ICD-10-CM | POA: Diagnosis not present

## 2020-09-11 ENCOUNTER — Other Ambulatory Visit: Payer: Self-pay | Admitting: Interventional Cardiology

## 2020-09-21 DIAGNOSIS — M542 Cervicalgia: Secondary | ICD-10-CM | POA: Diagnosis not present

## 2020-09-21 DIAGNOSIS — M546 Pain in thoracic spine: Secondary | ICD-10-CM | POA: Diagnosis not present

## 2020-09-21 DIAGNOSIS — M545 Low back pain, unspecified: Secondary | ICD-10-CM | POA: Diagnosis not present

## 2020-09-26 ENCOUNTER — Other Ambulatory Visit: Payer: Self-pay | Admitting: Family Medicine

## 2020-09-26 DIAGNOSIS — R413 Other amnesia: Secondary | ICD-10-CM

## 2020-10-01 DIAGNOSIS — R4182 Altered mental status, unspecified: Secondary | ICD-10-CM | POA: Diagnosis not present

## 2020-10-01 DIAGNOSIS — I493 Ventricular premature depolarization: Secondary | ICD-10-CM | POA: Diagnosis not present

## 2020-10-01 DIAGNOSIS — M549 Dorsalgia, unspecified: Secondary | ICD-10-CM | POA: Diagnosis not present

## 2020-10-01 DIAGNOSIS — Z7982 Long term (current) use of aspirin: Secondary | ICD-10-CM | POA: Diagnosis not present

## 2020-10-01 DIAGNOSIS — R918 Other nonspecific abnormal finding of lung field: Secondary | ICD-10-CM | POA: Diagnosis not present

## 2020-10-01 DIAGNOSIS — I447 Left bundle-branch block, unspecified: Secondary | ICD-10-CM | POA: Diagnosis present

## 2020-10-01 DIAGNOSIS — D332 Benign neoplasm of brain, unspecified: Secondary | ICD-10-CM | POA: Diagnosis not present

## 2020-10-01 DIAGNOSIS — Z8249 Family history of ischemic heart disease and other diseases of the circulatory system: Secondary | ICD-10-CM | POA: Diagnosis not present

## 2020-10-01 DIAGNOSIS — I454 Nonspecific intraventricular block: Secondary | ICD-10-CM | POA: Diagnosis not present

## 2020-10-01 DIAGNOSIS — I6521 Occlusion and stenosis of right carotid artery: Secondary | ICD-10-CM | POA: Diagnosis not present

## 2020-10-01 DIAGNOSIS — R1310 Dysphagia, unspecified: Secondary | ICD-10-CM | POA: Diagnosis present

## 2020-10-01 DIAGNOSIS — I639 Cerebral infarction, unspecified: Secondary | ICD-10-CM | POA: Diagnosis not present

## 2020-10-01 DIAGNOSIS — Z781 Physical restraint status: Secondary | ICD-10-CM | POA: Diagnosis not present

## 2020-10-01 DIAGNOSIS — K219 Gastro-esophageal reflux disease without esophagitis: Secondary | ICD-10-CM | POA: Diagnosis present

## 2020-10-01 DIAGNOSIS — E669 Obesity, unspecified: Secondary | ICD-10-CM | POA: Diagnosis present

## 2020-10-01 DIAGNOSIS — J9811 Atelectasis: Secondary | ICD-10-CM | POA: Diagnosis not present

## 2020-10-01 DIAGNOSIS — G9389 Other specified disorders of brain: Secondary | ICD-10-CM | POA: Diagnosis not present

## 2020-10-01 DIAGNOSIS — R000 Tachycardia, unspecified: Secondary | ICD-10-CM | POA: Diagnosis not present

## 2020-10-01 DIAGNOSIS — C729 Malignant neoplasm of central nervous system, unspecified: Secondary | ICD-10-CM | POA: Diagnosis not present

## 2020-10-01 DIAGNOSIS — D496 Neoplasm of unspecified behavior of brain: Secondary | ICD-10-CM | POA: Diagnosis not present

## 2020-10-01 DIAGNOSIS — R4020 Unspecified coma: Secondary | ICD-10-CM | POA: Diagnosis not present

## 2020-10-01 DIAGNOSIS — R456 Violent behavior: Secondary | ICD-10-CM | POA: Diagnosis not present

## 2020-10-01 DIAGNOSIS — R1313 Dysphagia, pharyngeal phase: Secondary | ICD-10-CM | POA: Diagnosis not present

## 2020-10-01 DIAGNOSIS — Z66 Do not resuscitate: Secondary | ICD-10-CM | POA: Diagnosis not present

## 2020-10-01 DIAGNOSIS — R5381 Other malaise: Secondary | ICD-10-CM | POA: Diagnosis present

## 2020-10-01 DIAGNOSIS — E039 Hypothyroidism, unspecified: Secondary | ICD-10-CM | POA: Diagnosis present

## 2020-10-01 DIAGNOSIS — C719 Malignant neoplasm of brain, unspecified: Secondary | ICD-10-CM | POA: Diagnosis present

## 2020-10-01 DIAGNOSIS — I472 Ventricular tachycardia: Secondary | ICD-10-CM | POA: Diagnosis present

## 2020-10-01 DIAGNOSIS — E785 Hyperlipidemia, unspecified: Secondary | ICD-10-CM | POA: Diagnosis present

## 2020-10-01 DIAGNOSIS — R569 Unspecified convulsions: Secondary | ICD-10-CM | POA: Diagnosis not present

## 2020-10-01 DIAGNOSIS — I459 Conduction disorder, unspecified: Secondary | ICD-10-CM | POA: Diagnosis not present

## 2020-10-01 DIAGNOSIS — Z9911 Dependence on respirator [ventilator] status: Secondary | ICD-10-CM | POA: Diagnosis not present

## 2020-10-01 DIAGNOSIS — G936 Cerebral edema: Secondary | ICD-10-CM | POA: Diagnosis present

## 2020-10-01 DIAGNOSIS — G928 Other toxic encephalopathy: Secondary | ICD-10-CM | POA: Diagnosis present

## 2020-10-01 DIAGNOSIS — I469 Cardiac arrest, cause unspecified: Secondary | ICD-10-CM | POA: Diagnosis not present

## 2020-10-01 DIAGNOSIS — G4089 Other seizures: Secondary | ICD-10-CM | POA: Diagnosis not present

## 2020-10-01 DIAGNOSIS — I10 Essential (primary) hypertension: Secondary | ICD-10-CM | POA: Diagnosis present

## 2020-10-01 DIAGNOSIS — Z4682 Encounter for fitting and adjustment of non-vascular catheter: Secondary | ICD-10-CM | POA: Diagnosis not present

## 2020-10-01 DIAGNOSIS — J9600 Acute respiratory failure, unspecified whether with hypoxia or hypercapnia: Secondary | ICD-10-CM | POA: Diagnosis not present

## 2020-10-01 DIAGNOSIS — Z0389 Encounter for observation for other suspected diseases and conditions ruled out: Secondary | ICD-10-CM | POA: Diagnosis not present

## 2020-10-01 DIAGNOSIS — R4189 Other symptoms and signs involving cognitive functions and awareness: Secondary | ICD-10-CM | POA: Diagnosis not present

## 2020-10-01 DIAGNOSIS — R404 Transient alteration of awareness: Secondary | ICD-10-CM | POA: Diagnosis not present

## 2020-10-01 DIAGNOSIS — J90 Pleural effusion, not elsewhere classified: Secondary | ICD-10-CM | POA: Diagnosis not present

## 2020-10-01 DIAGNOSIS — Z808 Family history of malignant neoplasm of other organs or systems: Secondary | ICD-10-CM | POA: Diagnosis not present

## 2020-10-01 DIAGNOSIS — Z79899 Other long term (current) drug therapy: Secondary | ICD-10-CM | POA: Diagnosis not present

## 2020-10-01 DIAGNOSIS — K449 Diaphragmatic hernia without obstruction or gangrene: Secondary | ICD-10-CM | POA: Diagnosis not present

## 2020-10-01 DIAGNOSIS — K5900 Constipation, unspecified: Secondary | ICD-10-CM | POA: Diagnosis not present

## 2020-10-01 DIAGNOSIS — G8190 Hemiplegia, unspecified affecting unspecified side: Secondary | ICD-10-CM | POA: Diagnosis not present

## 2020-10-01 DIAGNOSIS — F32A Depression, unspecified: Secondary | ICD-10-CM | POA: Diagnosis present

## 2020-10-01 DIAGNOSIS — G8929 Other chronic pain: Secondary | ICD-10-CM | POA: Diagnosis not present

## 2020-10-10 DIAGNOSIS — Z6822 Body mass index (BMI) 22.0-22.9, adult: Secondary | ICD-10-CM | POA: Diagnosis not present

## 2020-10-10 DIAGNOSIS — R1310 Dysphagia, unspecified: Secondary | ICD-10-CM | POA: Diagnosis not present

## 2020-10-10 DIAGNOSIS — E785 Hyperlipidemia, unspecified: Secondary | ICD-10-CM | POA: Diagnosis not present

## 2020-10-10 DIAGNOSIS — C719 Malignant neoplasm of brain, unspecified: Secondary | ICD-10-CM | POA: Diagnosis not present

## 2020-10-10 DIAGNOSIS — J9600 Acute respiratory failure, unspecified whether with hypoxia or hypercapnia: Secondary | ICD-10-CM | POA: Diagnosis not present

## 2020-10-10 DIAGNOSIS — R569 Unspecified convulsions: Secondary | ICD-10-CM | POA: Diagnosis not present

## 2020-10-10 DIAGNOSIS — I10 Essential (primary) hypertension: Secondary | ICD-10-CM | POA: Diagnosis not present

## 2020-10-10 DIAGNOSIS — M545 Low back pain, unspecified: Secondary | ICD-10-CM | POA: Diagnosis not present

## 2020-10-10 DIAGNOSIS — E039 Hypothyroidism, unspecified: Secondary | ICD-10-CM | POA: Diagnosis not present

## 2020-10-10 DIAGNOSIS — R000 Tachycardia, unspecified: Secondary | ICD-10-CM | POA: Diagnosis not present

## 2020-10-10 DIAGNOSIS — I447 Left bundle-branch block, unspecified: Secondary | ICD-10-CM | POA: Diagnosis not present

## 2020-10-11 DIAGNOSIS — C719 Malignant neoplasm of brain, unspecified: Secondary | ICD-10-CM | POA: Diagnosis not present

## 2020-10-11 DIAGNOSIS — I10 Essential (primary) hypertension: Secondary | ICD-10-CM | POA: Diagnosis not present

## 2020-10-11 DIAGNOSIS — R1310 Dysphagia, unspecified: Secondary | ICD-10-CM | POA: Diagnosis not present

## 2020-10-11 DIAGNOSIS — M545 Low back pain, unspecified: Secondary | ICD-10-CM | POA: Diagnosis not present

## 2020-10-11 DIAGNOSIS — R569 Unspecified convulsions: Secondary | ICD-10-CM | POA: Diagnosis not present

## 2020-10-11 DIAGNOSIS — J9600 Acute respiratory failure, unspecified whether with hypoxia or hypercapnia: Secondary | ICD-10-CM | POA: Diagnosis not present

## 2020-10-14 DIAGNOSIS — C719 Malignant neoplasm of brain, unspecified: Secondary | ICD-10-CM | POA: Diagnosis not present

## 2020-10-14 DIAGNOSIS — M545 Low back pain, unspecified: Secondary | ICD-10-CM | POA: Diagnosis not present

## 2020-10-14 DIAGNOSIS — I10 Essential (primary) hypertension: Secondary | ICD-10-CM | POA: Diagnosis not present

## 2020-10-14 DIAGNOSIS — R569 Unspecified convulsions: Secondary | ICD-10-CM | POA: Diagnosis not present

## 2020-10-14 DIAGNOSIS — J9600 Acute respiratory failure, unspecified whether with hypoxia or hypercapnia: Secondary | ICD-10-CM | POA: Diagnosis not present

## 2020-10-14 DIAGNOSIS — R1310 Dysphagia, unspecified: Secondary | ICD-10-CM | POA: Diagnosis not present

## 2020-10-15 DIAGNOSIS — I10 Essential (primary) hypertension: Secondary | ICD-10-CM | POA: Diagnosis not present

## 2020-10-15 DIAGNOSIS — R569 Unspecified convulsions: Secondary | ICD-10-CM | POA: Diagnosis not present

## 2020-10-15 DIAGNOSIS — C719 Malignant neoplasm of brain, unspecified: Secondary | ICD-10-CM | POA: Diagnosis not present

## 2020-10-15 DIAGNOSIS — R1310 Dysphagia, unspecified: Secondary | ICD-10-CM | POA: Diagnosis not present

## 2020-10-15 DIAGNOSIS — M545 Low back pain, unspecified: Secondary | ICD-10-CM | POA: Diagnosis not present

## 2020-10-15 DIAGNOSIS — J9600 Acute respiratory failure, unspecified whether with hypoxia or hypercapnia: Secondary | ICD-10-CM | POA: Diagnosis not present

## 2020-10-16 DIAGNOSIS — M545 Low back pain, unspecified: Secondary | ICD-10-CM | POA: Diagnosis not present

## 2020-10-16 DIAGNOSIS — C719 Malignant neoplasm of brain, unspecified: Secondary | ICD-10-CM | POA: Diagnosis not present

## 2020-10-16 DIAGNOSIS — I10 Essential (primary) hypertension: Secondary | ICD-10-CM | POA: Diagnosis not present

## 2020-10-16 DIAGNOSIS — R1310 Dysphagia, unspecified: Secondary | ICD-10-CM | POA: Diagnosis not present

## 2020-10-16 DIAGNOSIS — R569 Unspecified convulsions: Secondary | ICD-10-CM | POA: Diagnosis not present

## 2020-10-16 DIAGNOSIS — J9600 Acute respiratory failure, unspecified whether with hypoxia or hypercapnia: Secondary | ICD-10-CM | POA: Diagnosis not present

## 2020-10-17 DIAGNOSIS — C719 Malignant neoplasm of brain, unspecified: Secondary | ICD-10-CM | POA: Diagnosis not present

## 2020-10-17 DIAGNOSIS — R569 Unspecified convulsions: Secondary | ICD-10-CM | POA: Diagnosis not present

## 2020-10-17 DIAGNOSIS — J9600 Acute respiratory failure, unspecified whether with hypoxia or hypercapnia: Secondary | ICD-10-CM | POA: Diagnosis not present

## 2020-10-17 DIAGNOSIS — R1310 Dysphagia, unspecified: Secondary | ICD-10-CM | POA: Diagnosis not present

## 2020-10-17 DIAGNOSIS — M545 Low back pain, unspecified: Secondary | ICD-10-CM | POA: Diagnosis not present

## 2020-10-17 DIAGNOSIS — I10 Essential (primary) hypertension: Secondary | ICD-10-CM | POA: Diagnosis not present

## 2020-10-18 DIAGNOSIS — R569 Unspecified convulsions: Secondary | ICD-10-CM | POA: Diagnosis not present

## 2020-10-18 DIAGNOSIS — R1310 Dysphagia, unspecified: Secondary | ICD-10-CM | POA: Diagnosis not present

## 2020-10-18 DIAGNOSIS — C719 Malignant neoplasm of brain, unspecified: Secondary | ICD-10-CM | POA: Diagnosis not present

## 2020-10-18 DIAGNOSIS — J9600 Acute respiratory failure, unspecified whether with hypoxia or hypercapnia: Secondary | ICD-10-CM | POA: Diagnosis not present

## 2020-10-18 DIAGNOSIS — M545 Low back pain, unspecified: Secondary | ICD-10-CM | POA: Diagnosis not present

## 2020-10-18 DIAGNOSIS — I10 Essential (primary) hypertension: Secondary | ICD-10-CM | POA: Diagnosis not present

## 2020-10-23 DIAGNOSIS — C719 Malignant neoplasm of brain, unspecified: Secondary | ICD-10-CM | POA: Diagnosis not present

## 2020-10-23 DIAGNOSIS — R569 Unspecified convulsions: Secondary | ICD-10-CM | POA: Diagnosis not present

## 2020-10-23 DIAGNOSIS — J9600 Acute respiratory failure, unspecified whether with hypoxia or hypercapnia: Secondary | ICD-10-CM | POA: Diagnosis not present

## 2020-10-23 DIAGNOSIS — I10 Essential (primary) hypertension: Secondary | ICD-10-CM | POA: Diagnosis not present

## 2020-10-23 DIAGNOSIS — R1310 Dysphagia, unspecified: Secondary | ICD-10-CM | POA: Diagnosis not present

## 2020-10-23 DIAGNOSIS — M545 Low back pain, unspecified: Secondary | ICD-10-CM | POA: Diagnosis not present

## 2020-10-24 DIAGNOSIS — R569 Unspecified convulsions: Secondary | ICD-10-CM | POA: Diagnosis not present

## 2020-10-24 DIAGNOSIS — J9600 Acute respiratory failure, unspecified whether with hypoxia or hypercapnia: Secondary | ICD-10-CM | POA: Diagnosis not present

## 2020-10-24 DIAGNOSIS — R1310 Dysphagia, unspecified: Secondary | ICD-10-CM | POA: Diagnosis not present

## 2020-10-24 DIAGNOSIS — C719 Malignant neoplasm of brain, unspecified: Secondary | ICD-10-CM | POA: Diagnosis not present

## 2020-10-24 DIAGNOSIS — I10 Essential (primary) hypertension: Secondary | ICD-10-CM | POA: Diagnosis not present

## 2020-10-24 DIAGNOSIS — M545 Low back pain, unspecified: Secondary | ICD-10-CM | POA: Diagnosis not present

## 2020-10-25 DIAGNOSIS — M545 Low back pain, unspecified: Secondary | ICD-10-CM | POA: Diagnosis not present

## 2020-10-25 DIAGNOSIS — C719 Malignant neoplasm of brain, unspecified: Secondary | ICD-10-CM | POA: Diagnosis not present

## 2020-10-25 DIAGNOSIS — J9600 Acute respiratory failure, unspecified whether with hypoxia or hypercapnia: Secondary | ICD-10-CM | POA: Diagnosis not present

## 2020-10-25 DIAGNOSIS — R569 Unspecified convulsions: Secondary | ICD-10-CM | POA: Diagnosis not present

## 2020-10-25 DIAGNOSIS — I10 Essential (primary) hypertension: Secondary | ICD-10-CM | POA: Diagnosis not present

## 2020-10-25 DIAGNOSIS — R1310 Dysphagia, unspecified: Secondary | ICD-10-CM | POA: Diagnosis not present

## 2020-10-27 DIAGNOSIS — M545 Low back pain, unspecified: Secondary | ICD-10-CM | POA: Diagnosis not present

## 2020-10-27 DIAGNOSIS — I10 Essential (primary) hypertension: Secondary | ICD-10-CM | POA: Diagnosis not present

## 2020-10-27 DIAGNOSIS — R1310 Dysphagia, unspecified: Secondary | ICD-10-CM | POA: Diagnosis not present

## 2020-10-27 DIAGNOSIS — J9600 Acute respiratory failure, unspecified whether with hypoxia or hypercapnia: Secondary | ICD-10-CM | POA: Diagnosis not present

## 2020-10-27 DIAGNOSIS — C719 Malignant neoplasm of brain, unspecified: Secondary | ICD-10-CM | POA: Diagnosis not present

## 2020-10-27 DIAGNOSIS — R569 Unspecified convulsions: Secondary | ICD-10-CM | POA: Diagnosis not present

## 2020-10-28 DIAGNOSIS — R1310 Dysphagia, unspecified: Secondary | ICD-10-CM | POA: Diagnosis not present

## 2020-10-28 DIAGNOSIS — M545 Low back pain, unspecified: Secondary | ICD-10-CM | POA: Diagnosis not present

## 2020-10-28 DIAGNOSIS — C719 Malignant neoplasm of brain, unspecified: Secondary | ICD-10-CM | POA: Diagnosis not present

## 2020-10-28 DIAGNOSIS — J9600 Acute respiratory failure, unspecified whether with hypoxia or hypercapnia: Secondary | ICD-10-CM | POA: Diagnosis not present

## 2020-10-28 DIAGNOSIS — R569 Unspecified convulsions: Secondary | ICD-10-CM | POA: Diagnosis not present

## 2020-10-28 DIAGNOSIS — I10 Essential (primary) hypertension: Secondary | ICD-10-CM | POA: Diagnosis not present

## 2020-10-29 DIAGNOSIS — J9600 Acute respiratory failure, unspecified whether with hypoxia or hypercapnia: Secondary | ICD-10-CM | POA: Diagnosis not present

## 2020-10-29 DIAGNOSIS — R1310 Dysphagia, unspecified: Secondary | ICD-10-CM | POA: Diagnosis not present

## 2020-10-29 DIAGNOSIS — M545 Low back pain, unspecified: Secondary | ICD-10-CM | POA: Diagnosis not present

## 2020-10-29 DIAGNOSIS — C719 Malignant neoplasm of brain, unspecified: Secondary | ICD-10-CM | POA: Diagnosis not present

## 2020-10-29 DIAGNOSIS — I10 Essential (primary) hypertension: Secondary | ICD-10-CM | POA: Diagnosis not present

## 2020-10-29 DIAGNOSIS — R569 Unspecified convulsions: Secondary | ICD-10-CM | POA: Diagnosis not present

## 2020-10-30 DIAGNOSIS — R569 Unspecified convulsions: Secondary | ICD-10-CM | POA: Diagnosis not present

## 2020-10-30 DIAGNOSIS — I10 Essential (primary) hypertension: Secondary | ICD-10-CM | POA: Diagnosis not present

## 2020-10-30 DIAGNOSIS — C719 Malignant neoplasm of brain, unspecified: Secondary | ICD-10-CM | POA: Diagnosis not present

## 2020-10-30 DIAGNOSIS — M545 Low back pain, unspecified: Secondary | ICD-10-CM | POA: Diagnosis not present

## 2020-10-30 DIAGNOSIS — R1310 Dysphagia, unspecified: Secondary | ICD-10-CM | POA: Diagnosis not present

## 2020-10-30 DIAGNOSIS — J9600 Acute respiratory failure, unspecified whether with hypoxia or hypercapnia: Secondary | ICD-10-CM | POA: Diagnosis not present

## 2020-11-02 DEATH — deceased

## 2023-04-18 IMAGING — CR DG CERVICAL SPINE 2 OR 3 VIEWS
3 series · 3 of 3 positions shown · non-contrast
Comparison: None.

CLINICAL DATA: 87-year-old female with neck pain.

EXAM:
CERVICAL SPINE - 2-3 VIEW

[w cervical spine lat]
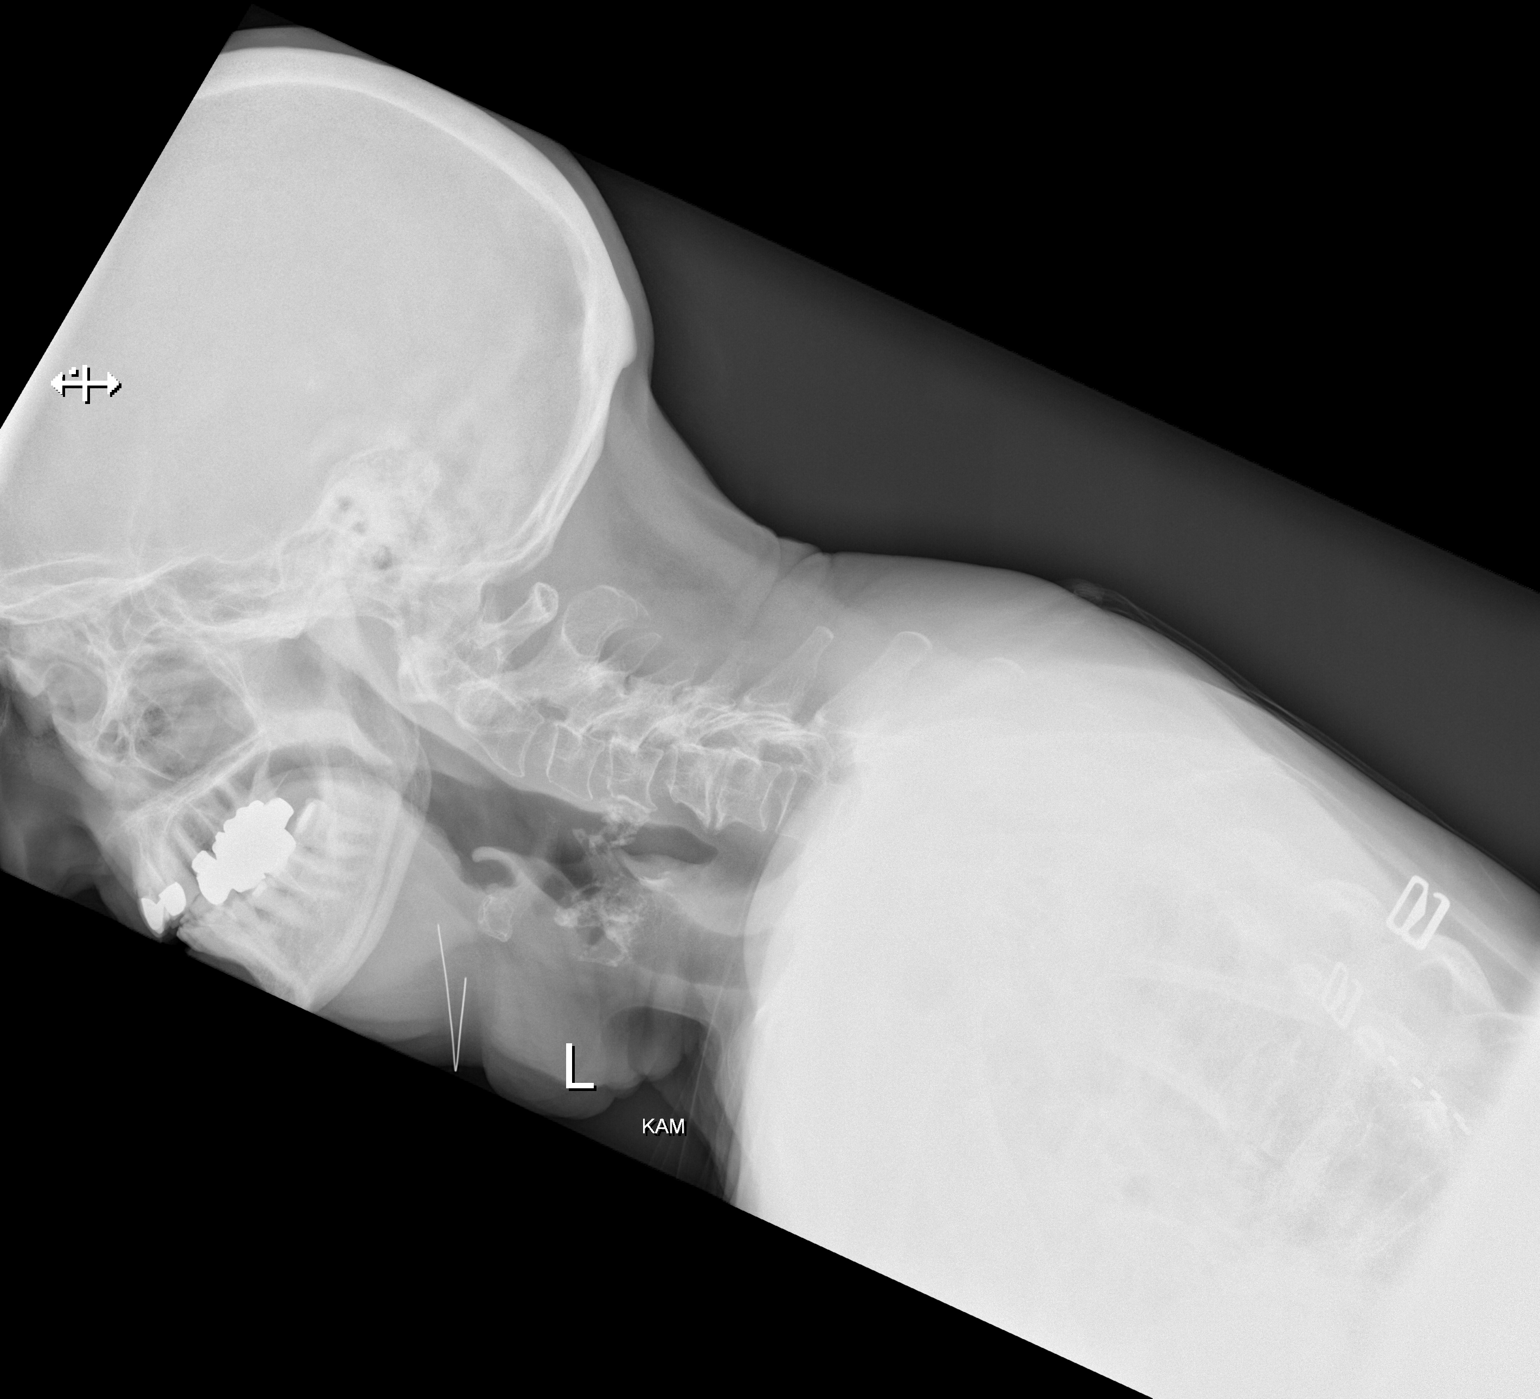

[w cervical spine ap]
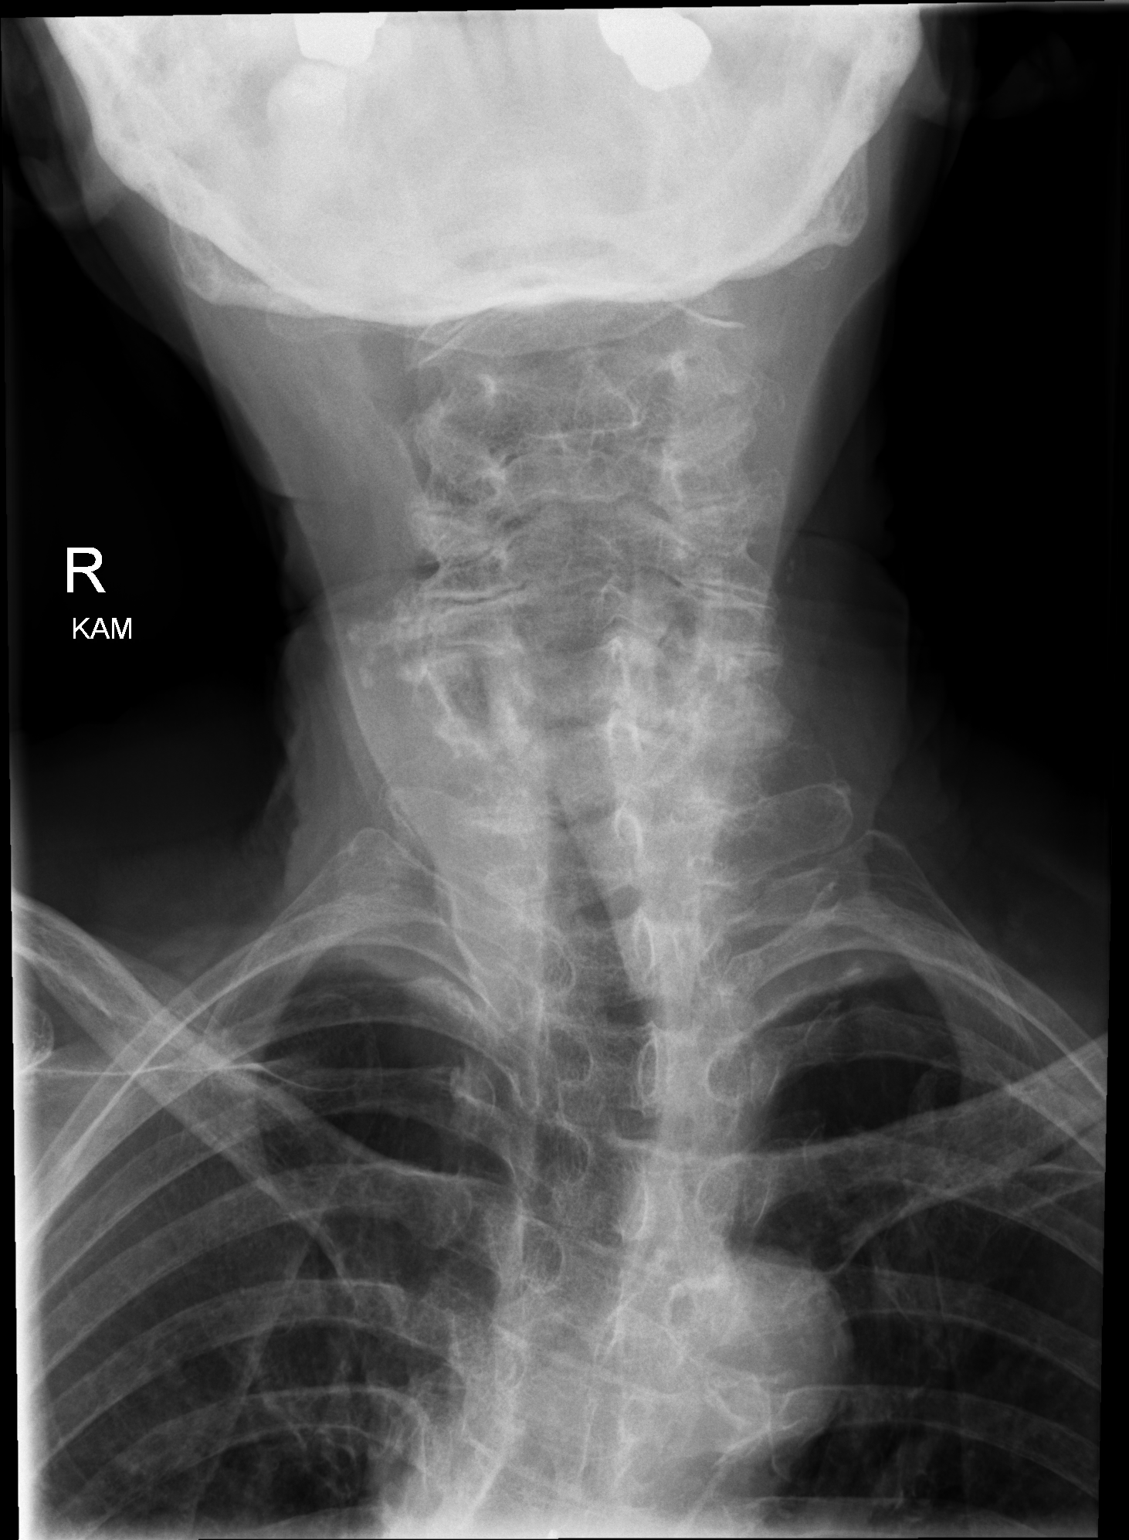

[w cervical spine odontoid]
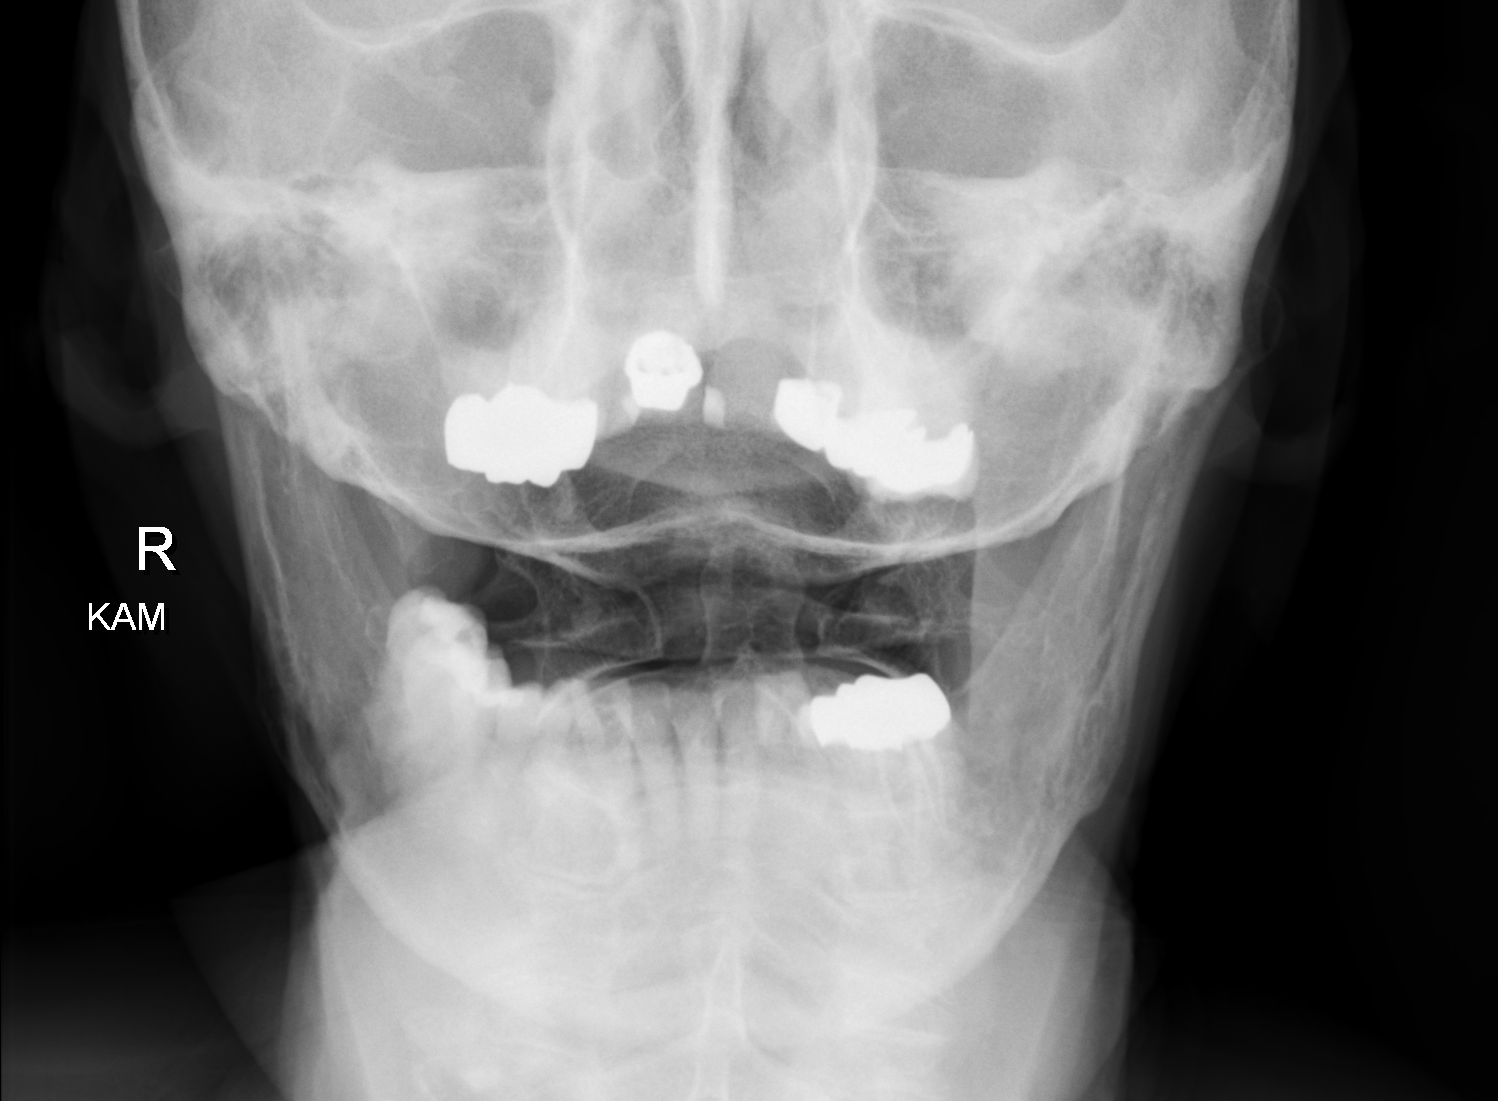

[3 of 3 positions shown; findings below may reference images not displayed]

FINDINGS: There is no acute fracture or subluxation of the cervical spine.
Grade 1 C3-C4 and C4-C5 anterolisthesis. Degenerative changes with
disc space narrowing and spurring primarily at C5-C6. The visualized
posterior elements and odontoid appear intact. Multilevel facet
arthropathy. The soft tissues are unremarkable.
IMPRESSION: 1. No acute fracture or subluxation.
2. Degenerative changes.
